# Patient Record
Sex: Female | Born: 1950 | Race: White | Hispanic: No | Marital: Married | Smoking: Former smoker
Health system: Southern US, Community
[De-identification: ages and names within clinical notes are randomized; demographics above are authoritative.]

## PROBLEM LIST (undated history)

## (undated) ENCOUNTER — Emergency Department (HOSPITAL_COMMUNITY): Payer: 59 | Source: Home / Self Care

## (undated) DIAGNOSIS — C519 Malignant neoplasm of vulva, unspecified: Secondary | ICD-10-CM

## (undated) DIAGNOSIS — Z8709 Personal history of other diseases of the respiratory system: Secondary | ICD-10-CM

## (undated) DIAGNOSIS — L309 Dermatitis, unspecified: Secondary | ICD-10-CM

## (undated) DIAGNOSIS — N9089 Other specified noninflammatory disorders of vulva and perineum: Secondary | ICD-10-CM

## (undated) DIAGNOSIS — Z923 Personal history of irradiation: Secondary | ICD-10-CM

## (undated) DIAGNOSIS — C801 Malignant (primary) neoplasm, unspecified: Secondary | ICD-10-CM

## (undated) DIAGNOSIS — R51 Headache: Secondary | ICD-10-CM

## (undated) HISTORY — DX: Other specified noninflammatory disorders of vulva and perineum: N90.89

## (undated) HISTORY — DX: Personal history of irradiation: Z92.3

---

## 1979-01-14 HISTORY — PX: DILATION AND CURETTAGE OF UTERUS: SHX78

## 1979-01-14 HISTORY — PX: TUBAL LIGATION: SHX77

## 2012-04-11 ENCOUNTER — Encounter (HOSPITAL_COMMUNITY): Payer: Self-pay | Admitting: *Deleted

## 2012-04-11 ENCOUNTER — Emergency Department (HOSPITAL_COMMUNITY): Payer: 59

## 2012-04-11 ENCOUNTER — Emergency Department (HOSPITAL_COMMUNITY)
Admission: EM | Admit: 2012-04-11 | Discharge: 2012-04-11 | Disposition: A | Discharge status: Home / Self Care | Payer: 59 | Attending: Emergency Medicine | Admitting: Emergency Medicine

## 2012-04-11 DIAGNOSIS — R200 Anesthesia of skin: Secondary | ICD-10-CM

## 2012-04-11 DIAGNOSIS — R42 Dizziness and giddiness: Secondary | ICD-10-CM

## 2012-04-11 DIAGNOSIS — R269 Unspecified abnormalities of gait and mobility: Secondary | ICD-10-CM | POA: Insufficient documentation

## 2012-04-11 DIAGNOSIS — F17200 Nicotine dependence, unspecified, uncomplicated: Secondary | ICD-10-CM | POA: Insufficient documentation

## 2012-04-11 DIAGNOSIS — H9312 Tinnitus, left ear: Secondary | ICD-10-CM

## 2012-04-11 DIAGNOSIS — R209 Unspecified disturbances of skin sensation: Secondary | ICD-10-CM | POA: Insufficient documentation

## 2012-04-11 DIAGNOSIS — R110 Nausea: Secondary | ICD-10-CM

## 2012-04-11 DIAGNOSIS — R2689 Other abnormalities of gait and mobility: Secondary | ICD-10-CM

## 2012-04-11 DIAGNOSIS — H9319 Tinnitus, unspecified ear: Secondary | ICD-10-CM | POA: Insufficient documentation

## 2012-04-11 LAB — COMPREHENSIVE METABOLIC PANEL
ALT: 13 U/L (ref 0–35)
AST: 15 U/L (ref 0–37)
Albumin: 4 g/dL (ref 3.5–5.2)
CO2: 27 mEq/L (ref 19–32)
Calcium: 9.8 mg/dL (ref 8.4–10.5)
Creatinine, Ser: 0.57 mg/dL (ref 0.50–1.10)
GFR calc non Af Amer: >90 mL/min (ref >90)
Sodium: 140 mEq/L (ref 135–145)
Total Protein: 7.3 g/dL (ref 6.0–8.3)

## 2012-04-11 LAB — CBC WITH DIFFERENTIAL/PLATELET
Basophils Absolute: 0 10*3/uL (ref 0.0–0.1)
Basophils Relative: 0 % (ref 0–1)
Eosinophils Relative: 2 % (ref 0–5)
Lymphocytes Relative: 23 % (ref 12–46)
MCHC: 33.3 g/dL (ref 30.0–36.0)
MCV: 91.4 fL (ref 78.0–100.0)
Platelets: 219 10*3/uL (ref 150–400)
RDW: 13.4 % (ref 11.5–15.5)
WBC: 6.3 10*3/uL (ref 4.0–10.5)

## 2012-04-11 LAB — TROPONIN I: Troponin I: <0.3 ng/mL (ref <0.30)

## 2012-04-11 LAB — PROTIME-INR
INR: 1.05 (ref 0.00–1.49)
Prothrombin Time: 13.6 seconds (ref 11.6–15.2)

## 2012-04-11 MED ORDER — MECLIZINE HCL 12.5 MG PO TABS
25.0000 mg | ORAL_TABLET | Freq: Once | ORAL | Status: AC
  Administered 2012-04-11: 25 mg via ORAL
  Filled 2012-04-11: qty 2

## 2012-04-11 MED ORDER — ONDANSETRON HCL 4 MG PO TABS: 4.0000 mg | ORAL_TABLET | Freq: Four times a day (QID) | ORAL | Status: DC | PRN

## 2012-04-11 MED ORDER — MECLIZINE HCL 25 MG PO TABS: 25.0000 mg | ORAL_TABLET | Freq: Four times a day (QID) | ORAL | Status: DC | PRN

## 2012-04-11 MED ORDER — ONDANSETRON 8 MG PO TBDP
8.0000 mg | ORAL_TABLET | Freq: Once | ORAL | Status: AC
  Administered 2012-04-11: 8 mg via ORAL
  Filled 2012-04-11: qty 1

## 2012-04-11 MED ORDER — MECLIZINE HCL 12.5 MG PO TABS
ORAL_TABLET | ORAL | Status: AC
  Filled 2012-04-11: qty 6

## 2012-04-11 NOTE — ED Notes (Signed)
Pt presents with light-headed dizziness, tinnitus and tingling in left 3 rd and 4 th digits. Equal hand grips. Pt denies ear pain, history of hypertension and diabetes. Pt states was engaged with increased physical work at place of employment yesterday and was thought to be the cause of dizziness and tingling in digits.  Pt refers to dizziness that began this morning, Pt reports taking a 325 mg asa this morning.  No neurological deficits noted at this time.

## 2012-04-11 NOTE — ED Notes (Signed)
Pt with ringing in ears and dizziness since this morning, an hour ago with tingling to left side of face

## 2012-04-11 NOTE — ED Provider Notes (Signed)
History   This chart was scribed for Ward Givens, MD by Gerlean Ren, ED Scribe. This patient was seen in room APA04/APA04 and the patient's care was started at 5:55 PM    CSN: [id number]  Arrival date & time 04/11/12  1657   First MD Initiated Contact with Patient 04/11/12 1704      Chief Complaint  Patient presents with  . Dizziness  . Tinnitus     The history is provided by the patient. No language interpreter was used.   Jody Beard is a 61 y.o. female with h/o migraines who presents to the Emergency Department complaining of sudden onset light-headedness when getting out of bed this morning that is still present when standing that resolves when lying supine with associated mild nausea, 1-2 hours of tingling in left third and fourth fingers that is gone, 1-2 hours of left-side face "heaviness" that resolved by itself, and 1-2 hours of associated waxing-and-waning high-pitched left sided tinnitus that resolved by itself.  Pt was ambulatory to room and has not fallen, but states that it feels like she may fall at times when walking and she reaches out to grab furniture to steady herself.  Pt denies any room spinning sensations and true dizziness, chest pain, dyspnea, HA, emesis, blurred or double vision, and numbness or weakness in lower extremities.  Pt denies h/o similar symptoms but states left-side face numbness associated with one previous migraine 20 years ago and had a normal MR brain  and did not have CVA.     Pt is not currently on any prescriptions.    Pt denies h/o cardiac problems but reports mother died at 66 y.o. after bypass.  Pt is a current everyday smoker but denies alcohol use.   No PCP.  History reviewed. No pertinent past medical history.  History reviewed. No pertinent past surgical history.  History reviewed. No pertinent family history. MOP died age 30 having CABG done in the 34's FOP died at 37's from renal cancer Sister died age 6 from kidney  disease  History  Substance Use Topics  . Smoking status: Current Every Day Smoker -- 0.5 packs/day    Types: Cigarettes  . Smokeless tobacco: Not on file  . Alcohol Use: No  employed Lives at home Lives with spouse No OB history provided.  Moved from [place] 6 years ago  Review of Systems  HENT: Positive for tinnitus.   Eyes: Negative for visual disturbance.  Respiratory: Negative for shortness of breath.   Cardiovascular: Negative for chest pain.  Gastrointestinal: Positive for nausea. Negative for vomiting.  Neurological: Positive for light-headedness and numbness. Negative for dizziness, weakness and headaches.  All other systems reviewed and are negative.    Allergies  Penicillins  Home Medications  No current outpatient prescriptions on file.  BP 151/82  Pulse 88  Temp 97.4 F (36.3 C) (Oral)  Resp 18  Ht 5\' 1"  (1.549 m)  Wt 145 lb (65.772 kg)  BMI 27.40 kg/m2  SpO2 98%  Vital signs normal except mild hypertension   Physical Exam  Nursing note and vitals reviewed. Constitutional: She is oriented to person, place, and time. She appears well-developed and well-nourished.  Non-toxic appearance. She does not appear ill. No distress.       Feels off balance when I stand her up however no nystagymus seen  HENT:  Head: Normocephalic and atraumatic.  Right Ear: External ear normal.  Left Ear: External ear normal.  Nose: Nose normal. No mucosal edema  or rhinorrhea.  Mouth/Throat: Oropharynx is clear and moist and mucous membranes are normal. No dental abscesses or uvula swelling.       Tongue dry.  Eyes: Conjunctivae normal and EOM are normal. Pupils are equal, round, and reactive to light.       No nystagmus.  Neck: Normal range of motion and full passive range of motion without pain. Neck supple.  Cardiovascular: Normal rate, regular rhythm and normal heart sounds.  Exam reveals no gallop and no friction rub.   No murmur heard. Pulmonary/Chest: Effort normal  and breath sounds normal. No respiratory distress. She has no wheezes. She has no rhonchi. She has no rales. She exhibits no tenderness and no crepitus.  Abdominal: Soft. Normal appearance and bowel sounds are normal. She exhibits no distension. There is no tenderness. There is no rebound and no guarding.  Musculoskeletal: Normal range of motion. She exhibits no edema and no tenderness.       Moves all extremities well.   Neurological: She is alert and oriented to person, place, and time. She has normal strength. No cranial nerve deficit. Coordination normal.       No pronator drift.  5/5 equal grip strength.  No motor weakness.  Skin: Skin is warm, dry and intact. No rash noted. No erythema. No pallor.  Psychiatric: She has a normal mood and affect. Her speech is normal and behavior is normal. Her mood appears not anxious.    ED Course  Procedures (including critical care time) DIAGNOSTIC STUDIES: Oxygen Saturation is 98% on room air, normal by my interpretation.    COORDINATION OF CARE: 6:06 PM- Patient informed of clinical course, understands medical decision-making process, and agrees with plan.  Ordered PO antivert, PO Zofran, CBC, c-met, APTT, protime-INR, troponin, chest XR, and head CT w/o contrast.  Recheck 19:15 pt feels better on standing up after medications, still has mild feeling of being off balance. Her facial heaviness and numbness in her fingers are gone. No tinnitus at this time. We discussed coming back to get a MR (no available today b/o holiday) and to start taking a regular aspirin a day. She also was shown her CXR and states she is going to quit smoking.   Results for orders placed during the hospital encounter of 04/11/12  CBC WITH DIFFERENTIAL      Component Value Range   WBC 6.3  4.0 - 10.5 K/uL   RBC 4.79  3.87 - 5.11 MIL/uL   Hemoglobin 14.6  12.0 - 15.0 g/dL   HCT 16.1  09.6 - 04.5 %   MCV 91.4  78.0 - 100.0 fL   MCH 30.5  26.0 - 34.0 pg   MCHC 33.3  30.0 -  36.0 g/dL   RDW 40.9  81.1 - 91.4 %   Platelets 219  150 - 400 K/uL   Neutrophils Relative 68  43 - 77 %   Neutro Abs 4.2  1.7 - 7.7 K/uL   Lymphocytes Relative 23  12 - 46 %   Lymphs Abs 1.5  0.7 - 4.0 K/uL   Monocytes Relative 7  3 - 12 %   Monocytes Absolute 0.5  0.1 - 1.0 K/uL   Eosinophils Relative 2  0 - 5 %   Eosinophils Absolute 0.1  0.0 - 0.7 K/uL   Basophils Relative 0  0 - 1 %   Basophils Absolute 0.0  0.0 - 0.1 K/uL  COMPREHENSIVE METABOLIC PANEL      Component Value Range  Sodium 140  135 - 145 mEq/L   Potassium 4.0  3.5 - 5.1 mEq/L   Chloride 104  96 - 112 mEq/L   CO2 27  19 - 32 mEq/L   Glucose, Bld 98  70 - 99 mg/dL   BUN 9  6 - 23 mg/dL   Creatinine, Ser 1.61  0.50 - 1.10 mg/dL   Calcium 9.8  8.4 - 09.6 mg/dL   Total Protein 7.3  6.0 - 8.3 g/dL   Albumin 4.0  3.5 - 5.2 g/dL   AST 15  0 - 37 U/L   ALT 13  0 - 35 U/L   Alkaline Phosphatase 94  39 - 117 U/L   Total Bilirubin 0.3  0.3 - 1.2 mg/dL   GFR calc non Af Amer >90  >90 mL/min   GFR calc Af Amer >90  >90 mL/min  APTT      Component Value Range   aPTT 42 (*) 24 - 37 seconds  PROTIME-INR      Component Value Range   Prothrombin Time 13.6  11.6 - 15.2 seconds   INR 1.05  0.00 - 1.49  TROPONIN I      Component Value Range   Troponin I <0.30  <0.30 ng/mL   Laboratory interpretation all normal  Dg Chest 2 View  04/11/2012  *RADIOLOGY REPORT*  Clinical Data: Dizziness  CHEST - 2 VIEW  Comparison: None.  Findings: Lungs are hyperaerated suggestive of emphysema.  Heart size is normal.  No focal pulmonary opacity.  No pleural effusion. No acute osseous finding.  IMPRESSION: Hyperinflation suggestive of emphysematous changes.  No acute finding.   Original Report Authenticated By: Christiana Pellant, M.D.    Ct Head Wo Contrast  04/11/2012  *RADIOLOGY REPORT*  Clinical Data: Dizziness  CT HEAD WITHOUT CONTRAST  Technique:  Contiguous axial images were obtained from the base of the skull through the vertex  without contrast.  Comparison: None.  Findings: Prominent perivascular space incidentally noted adjacent to the left external capsule image 9. No acute hemorrhage, acute infarction, or mass lesion is identified.  No midline shift.  No ventriculomegaly.  No skull fracture.  No midline shift.  Orbits and paranasal sinuses are unremarkable.  IMPRESSION: Normal head CT.   Original Report Authenticated By: Christiana Pellant, M.D.      Date: 04/11/2012  Rate: 66  Rhythm: normal sinus rhythm  QRS Axis: normal  Intervals: normal  ST/T Wave abnormalities: normal  Conduction Disutrbances:none  Narrative Interpretation:   Old EKG Reviewed: none available      1. Dizziness   2. Impairment of balance   3. Facial numbness   4. Numbness of fingers   5. Nausea   6. Tinnitus, left       MDM  patient has symptoms that are worrisome for possible TIA today, her symptoms were slightly atypical for vertigo in that she had no spinning sensation. Is unavailable today at this facility and her symptoms have improved. Therefore am going to schedule her for outpatient MR the brain tomorrow      I personally performed the services described in this documentation, which was scribed in my presence. The recorded information has been reviewed and considered.  Devoria Albe, MD, [facility]         Ward Givens, MD 04/11/12 2007

## 2012-04-12 ENCOUNTER — Ambulatory Visit (HOSPITAL_COMMUNITY)
Admit: 2012-04-12 | Discharge: 2012-04-12 | Disposition: A | Discharge status: Home / Self Care | Payer: 59 | Attending: Emergency Medicine | Admitting: Emergency Medicine

## 2012-04-12 DIAGNOSIS — H9319 Tinnitus, unspecified ear: Secondary | ICD-10-CM | POA: Insufficient documentation

## 2014-01-13 DIAGNOSIS — C801 Malignant (primary) neoplasm, unspecified: Secondary | ICD-10-CM

## 2014-01-13 DIAGNOSIS — C519 Malignant neoplasm of vulva, unspecified: Secondary | ICD-10-CM

## 2014-01-13 HISTORY — DX: Malignant neoplasm of vulva, unspecified: C51.9

## 2014-01-13 HISTORY — DX: Malignant (primary) neoplasm, unspecified: C80.1

## 2014-02-02 ENCOUNTER — Encounter (HOSPITAL_COMMUNITY): Payer: Self-pay | Admitting: Emergency Medicine

## 2014-02-02 ENCOUNTER — Emergency Department (HOSPITAL_COMMUNITY)
Admission: EM | Admit: 2014-02-02 | Discharge: 2014-02-02 | Disposition: A | Discharge status: Home / Self Care | Payer: BC Managed Care – PPO | Attending: Emergency Medicine | Admitting: Emergency Medicine

## 2014-02-02 DIAGNOSIS — Z880 Allergy status to penicillin: Secondary | ICD-10-CM | POA: Diagnosis not present

## 2014-02-02 DIAGNOSIS — N9089 Other specified noninflammatory disorders of vulva and perineum: Secondary | ICD-10-CM

## 2014-02-02 DIAGNOSIS — R599 Enlarged lymph nodes, unspecified: Secondary | ICD-10-CM | POA: Diagnosis not present

## 2014-02-02 DIAGNOSIS — Z87891 Personal history of nicotine dependence: Secondary | ICD-10-CM | POA: Diagnosis not present

## 2014-02-02 DIAGNOSIS — Z792 Long term (current) use of antibiotics: Secondary | ICD-10-CM | POA: Diagnosis not present

## 2014-02-02 DIAGNOSIS — N9489 Other specified conditions associated with female genital organs and menstrual cycle: Secondary | ICD-10-CM | POA: Insufficient documentation

## 2014-02-02 DIAGNOSIS — R590 Localized enlarged lymph nodes: Secondary | ICD-10-CM

## 2014-02-02 MED ORDER — CLINDAMYCIN HCL 300 MG PO CAPS
300.0000 mg | ORAL_CAPSULE | Freq: Four times a day (QID) | ORAL | Status: DC
Start: 2014-02-02 — End: 2014-02-16

## 2014-02-02 NOTE — Discharge Instructions (Signed)
AS WE DISCUSSED THESE LESIONS COULD BE CANCER.  PLEASE FOLLOWUP WITH GYNECOLOGY THIS WEEK  PLEASE RETURN FOR ANY NEW PAIN, ANY BLEEDING OR ANY FEVER OVER 100.2F OVER THE NEXT 24-48 HOURS

## 2014-02-02 NOTE — ED Notes (Signed)
Pt c/o multiple growths to groin and vaginal area x few months

## 2014-02-02 NOTE — ED Provider Notes (Signed)
CSN: [id number]     Arrival date & time 02/02/14  6384 History   First MD Initiated Contact with Patient 02/02/14 0606     Chief Complaint  Patient presents with  . Mass      The history is provided by the patient.  Patient presents for "masses" in her right groin as well as her vulvar region for "months"  She reports they are increasing in size.  She denies any significant pain.  No drainage from the lesions but did have small amount of bleeding today No abd pain No fever/vomiting No vaginal bleeding is reported.   She has never been evaluated for these lesions previously.  PMH - none History  Substance Use Topics  . Smoking status: Former Smoker -- 0.50 packs/day    Types: Cigarettes  . Smokeless tobacco: Not on file  . Alcohol Use: No   OB History   Grav Para Term Preterm Abortions TAB SAB Ect Mult Living                 Review of Systems  Constitutional: Negative for fever.  Gastrointestinal: Negative for vomiting and abdominal pain.  Genitourinary: Negative for vaginal bleeding.  Skin:       Skins lesions   All other systems reviewed and are negative.     Allergies  Penicillins  Home Medications   Prior to Admission medications   Medication Sig Start Date End Date Taking? Authorizing Provider  aspirin 325 MG tablet Take 650 mg by mouth once as needed. For pain    Historical Provider, MD  clindamycin (CLEOCIN) 300 MG capsule Take 1 capsule (300 mg total) by mouth 4 (four) times daily. X 7 days 02/02/14   Sharyon Cable, MD   BP 157/71  Pulse 76  Temp(Src) 97.9 F (36.6 C) (Oral)  Resp 20  Ht 5\' 1"  (1.549 m)  Wt 128 lb (58.06 kg)  BMI 24.20 kg/m2  SpO2 98% Physical Exam CONSTITUTIONAL: Well developed/well nourished HEAD: Normocephalic/atraumatic EYES: EOMI ENMT: Mucous membranes moist NECK: supple no meningeal signs CV: S1/S2 noted LUNGS: Lungs are clear to auscultation bilaterally, no apparent distress ABDOMEN: soft, nontender, no rebound or  guarding YK:[id number] chaperone present for exam - pt with large masses noted to right vulvar area. They are irregular in shape.  There is no active bleeding but there is localized erythema to lesions. NEURO: Pt is awake/alert, moves all extremitiesx4 EXTREMITIES: pulses normal, full ROM SKIN: warm, color normal LYMPH: - right inguinal lymphadenopathy noted   ED Course  Procedures   I am suspicious for vulvar cancer.  Pt reports these lesions have been present for months and are worsening but no medical evaluation.  She understands this could be cancer and she understands need for close f/u She has been instructed to f/u with Gynecology this week.  She will likely need biopsy very soon for diagnosis.  She was instructed to return to the ER if there is worsening pain/erythema or fever.  Will start on clindamycin in case there is any secondary infection associated with these lesions.  MDM   Final diagnoses:  Vulvar mass  Inguinal lymphadenopathy    Nursing notes including past medical history and social history reviewed and considered in documentation     Sharyon Cable, MD 02/02/14 [phone]

## 2014-02-03 ENCOUNTER — Encounter: Payer: Self-pay | Admitting: Obstetrics & Gynecology

## 2014-02-03 ENCOUNTER — Ambulatory Visit (INDEPENDENT_AMBULATORY_CARE_PROVIDER_SITE_OTHER): Payer: BC Managed Care – PPO | Admitting: Obstetrics & Gynecology

## 2014-02-03 VITALS — BP 150/80 | Ht 61.0 in | Wt 127.0 lb

## 2014-02-03 DIAGNOSIS — D4959 Neoplasm of unspecified behavior of other genitourinary organ: Secondary | ICD-10-CM

## 2014-02-03 DIAGNOSIS — C519 Malignant neoplasm of vulva, unspecified: Secondary | ICD-10-CM | POA: Insufficient documentation

## 2014-02-03 NOTE — Progress Notes (Signed)
Patient ID: Jody Beard, female   DOB: 07-25-1950, 63 y.o.   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 No pertinent past medical history.  History reviewed. No pertinent past surgical history.  OB History   Grav Para Term Preterm Abortions TAB SAB Ect Mult Living                  Allergies  Allergen Reactions  . Penicillins Rash    History   Social History  . Marital Status: Married    Spouse Name: N/A    Number of Children: N/A  . Years of Education: N/A   Social History Main Topics  . Smoking status: Former Smoker -- 0.50 packs/day    Types: Cigarettes  . Smokeless tobacco: None  . Alcohol Use: No  . Drug Use: No  . Sexual Activity: None   Other Topics Concern  . None   Social History Narrative  . None    History reviewed. No pertinent family history.   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

## 2014-02-03 NOTE — Patient Instructions (Signed)
GYN ONCOLOGY   [id number]

## 2014-02-04 ENCOUNTER — Telehealth: Payer: Self-pay | Admitting: Obstetrics & Gynecology

## 2014-02-04 NOTE — Telephone Encounter (Signed)
perfect

## 2014-02-05 ENCOUNTER — Encounter (HOSPITAL_COMMUNITY): Payer: Self-pay | Admitting: Pharmacy Technician

## 2014-02-05 ENCOUNTER — Encounter: Payer: Self-pay | Admitting: Gynecologic Oncology

## 2014-02-05 ENCOUNTER — Ambulatory Visit: Payer: BC Managed Care – PPO | Attending: Gynecologic Oncology | Admitting: Gynecologic Oncology

## 2014-02-05 VITALS — BP 161/69 | HR 69 | Temp 98.0°F | Resp 20 | Ht 61.0 in | Wt 128.7 lb

## 2014-02-05 DIAGNOSIS — Z87891 Personal history of nicotine dependence: Secondary | ICD-10-CM | POA: Insufficient documentation

## 2014-02-05 DIAGNOSIS — N9089 Other specified noninflammatory disorders of vulva and perineum: Secondary | ICD-10-CM | POA: Diagnosis present

## 2014-02-05 DIAGNOSIS — D071 Carcinoma in situ of vulva: Secondary | ICD-10-CM | POA: Insufficient documentation

## 2014-02-05 DIAGNOSIS — R599 Enlarged lymph nodes, unspecified: Secondary | ICD-10-CM | POA: Diagnosis not present

## 2014-02-05 DIAGNOSIS — C519 Malignant neoplasm of vulva, unspecified: Secondary | ICD-10-CM | POA: Insufficient documentation

## 2014-02-05 DIAGNOSIS — C44721 Squamous cell carcinoma of skin of unspecified lower limb, including hip: Secondary | ICD-10-CM | POA: Insufficient documentation

## 2014-02-05 HISTORY — PX: VULVA /PERINEUM BIOPSY: SHX319

## 2014-02-05 NOTE — Progress Notes (Signed)
Consult Note: Gyn-Onc  Consult was requested by Dr. Elonda Husky for the evaluation of Jody Beard 63 y.o. female  CC:  Chief Complaint  Patient presents with  . Vulvar Lesion    New patient    Assessment/Plan:  [name]  is a 63 y.o.  With a 7cm vulvar lesion that extends additionally into the 4cmof the distal vagina,  the vaginal submucosa and the urethra.  Satellite lesions appreciated on the inner thigh and mons.  Bilateral inguinal adenopathy noted.  The vulvar and satellite lesions were biopsied today.  Will proceed with EUA  Bilateral inguinal LND 02/10/2014 with Dr. Denman George PET to assess the extent of the metastatic disease Referral to Drs Marko Plume and Sondra Come for radiotherapy with CDDP sensitization Consider HIV test given the radid progression of the disease.   HPI: [name]  is a 63 y.o.   G2P2 wo first identified a right vulvar mass 7 months ago.  The lesion has increased in size.  Noted new lesions on the thigh and mons within the past few weeks. .  Reports vulvar itching for 7 months.  Reports h/o 1ppd tobacco use for 30 years.  Quit 07/2013.  Review of Systems:  Constitutional  Feels well,  Cardiovascular  No chest pain, shortness of breath, or edema  Pulmonary  No cough or wheeze.  Gastro Intestinal  No nausea, vomitting, or diarrhoea. No bright red blood per rectum, no abdominal pain, change in bowel movement, or constipation.  Genito Urinary  No frequency, urgency, dysuria,.  Reports discomfort sitting, intermittent vaginal bleeding Musculo Skeletal  No myalgia, arthralgia, joint swelling or pain  Neurologic  No weakness, numbness, change in gait,  Psychology  No depression.  Patient reports anxiety about the diagnosis  Current Meds:  Outpatient Encounter Prescriptions as of 02/05/2014  Medication Sig  . aspirin 325 MG tablet Take 650 mg by mouth once as needed. For pain  . clindamycin (CLEOCIN) 300 MG capsule Take 1 capsule (300 mg total) by mouth 4  (four) times daily. X 7 days    Allergy:  Allergies  Allergen Reactions  . Penicillins Rash    Social Hx:   History   Social History  . Marital Status: Married    Spouse Name: N/A    Number of Children: N/A  . Years of Education: N/A   Occupational History  . Not on file.   Social History Main Topics  . Smoking status: Former Smoker -- 0.50 packs/day    Types: Cigarettes    Quit date: 07/13/2013  . Smokeless tobacco: Not on file  . Alcohol Use: No  . Drug Use: No  . Sexual Activity: Not on file   Other Topics Concern  . Not on file   Social History Narrative  . No narrative on file    Past Surgical Hx: History reviewed. No pertinent past surgical history.  Past Medical Hx:  Past Medical History  Diagnosis Date  . Vulvar lesion     Past Gynecological History:  G2P2 last pap > 9 years ago. No LMP recorded. Patient is postmenopausal.  Family Hx:  Family History  Problem Relation Age of Onset  . Heart disease Mother   . Kidney cancer Father   . Prostate cancer Cousin     Vitals:  Blood pressure 161/69, pulse 69, temperature 98 F (36.7 C), temperature source Oral, resp. rate 20, height 5\' 1"  (1.549 m), weight 128 lb 11.2 oz (58.378 kg).  Physical Exam: WD in NAD  Neck  Supple NROM, without any enlargements.  Lymph Node Survey No cervical or supraclavicular  Adenopathy.  3cm right inguinal LN.  2 cm left inguinal LN Cardiovascular  Pulse normal rate, regularity and rhythm. S1 and S2 normal.  Lungs  Clear to auscultation bilaterally, Good air movement.  Skin  No rash/lesions/breakdown  Psychiatry  Alert and oriented appropriate mood affect speech and reasoning. Abdomen  Normoactive bowel sounds, abdomen soft, non-tender. Surgical  sites intact without evidence of hernia.  Back No CVA tenderness Genito Urinary  Vulva/vagina: 7 cm bilobed right vulavr mass with an additional 4cm extension to the distal vagina..  ? Satellite lesions of the inner  thigh and right mons. Bladder/urethra:  No lesions or masses  Vagina: disease palpable in the distal 4cm of the vagina, tumor palpable in the vaginal submucosa/ischiorectal area.- extension of the vulvar tumor.  Disease extends anterioraly to the ureteral meatus.  Cervix: unable to palpate, unable to insert speculum because of pelvic discomfort.    Rectal  Good tone, no masses no cul de sac nodularity.  Extremities  No bilateral cyanosis, clubbing or edema.  VULVAR BIOPSIES The procedure was reviewed with the patient including risks and potential complications. The introital and inner thigh areas  were prepped with betadine. 3 cc of 1% lidocaine was used to anesthetize the areas. Biopsies were collected at the vaginal introius and right inner thigh. The bleeding was controlled with monsels. The patient tolerated the procedure with minimal discomfort. Janie Morning, MD, PhD 02/05/2014, 9:36 AM

## 2014-02-06 ENCOUNTER — Ambulatory Visit (HOSPITAL_COMMUNITY)
Admission: RE | Admit: 2014-02-06 | Discharge: 2014-02-06 | Disposition: A | Discharge status: Home / Self Care | Payer: BC Managed Care – PPO | Source: Ambulatory Visit | Attending: Gynecologic Oncology | Admitting: Gynecologic Oncology

## 2014-02-06 ENCOUNTER — Encounter (HOSPITAL_COMMUNITY): Payer: Self-pay

## 2014-02-06 ENCOUNTER — Encounter (HOSPITAL_COMMUNITY)
Admission: RE | Admit: 2014-02-06 | Discharge: 2014-02-06 | Disposition: A | Discharge status: Home / Self Care | Payer: BC Managed Care – PPO | Source: Ambulatory Visit | Attending: Gynecologic Oncology | Admitting: Gynecologic Oncology

## 2014-02-06 ENCOUNTER — Other Ambulatory Visit: Payer: Self-pay | Admitting: Gynecologic Oncology

## 2014-02-06 DIAGNOSIS — C519 Malignant neoplasm of vulva, unspecified: Secondary | ICD-10-CM

## 2014-02-06 DIAGNOSIS — R918 Other nonspecific abnormal finding of lung field: Secondary | ICD-10-CM

## 2014-02-06 HISTORY — DX: Personal history of other diseases of the respiratory system: Z87.09

## 2014-02-06 HISTORY — DX: Malignant neoplasm of vulva, unspecified: C51.9

## 2014-02-06 HISTORY — DX: Dermatitis, unspecified: L30.9

## 2014-02-06 HISTORY — DX: Headache: R51

## 2014-02-06 LAB — CBC WITH DIFFERENTIAL/PLATELET
BASOS PCT: 0 % (ref 0–1)
Basophils Absolute: 0 10*3/uL (ref 0.0–0.1)
Eosinophils Absolute: 0.2 10*3/uL (ref 0.0–0.7)
Eosinophils Relative: 3 % (ref 0–5)
HEMATOCRIT: 42.1 % (ref 36.0–46.0)
HEMOGLOBIN: 13.4 g/dL (ref 12.0–15.0)
LYMPHS PCT: 17 % (ref 12–46)
Lymphs Abs: 1.6 10*3/uL (ref 0.7–4.0)
MCH: 28.5 pg (ref 26.0–34.0)
MCHC: 31.8 g/dL (ref 30.0–36.0)
MCV: 89.6 fL (ref 78.0–100.0)
MONO ABS: 0.7 10*3/uL (ref 0.1–1.0)
Monocytes Relative: 8 % (ref 3–12)
NEUTROS ABS: 6.7 10*3/uL (ref 1.7–7.7)
NEUTROS PCT: 72 % (ref 43–77)
Platelets: 304 10*3/uL (ref 150–400)
RBC: 4.7 MIL/uL (ref 3.87–5.11)
RDW: 13.6 % (ref 11.5–15.5)
WBC: 9.2 10*3/uL (ref 4.0–10.5)

## 2014-02-06 LAB — COMPREHENSIVE METABOLIC PANEL
ALBUMIN: 4.3 g/dL (ref 3.5–5.2)
ALK PHOS: 97 U/L (ref 39–117)
ALT: 11 U/L (ref 0–35)
AST: 15 U/L (ref 0–37)
Anion gap: 13 (ref 5–15)
BILIRUBIN TOTAL: 0.3 mg/dL (ref 0.3–1.2)
BUN: 8 mg/dL (ref 6–23)
CHLORIDE: 101 meq/L (ref 96–112)
CO2: 26 meq/L (ref 19–32)
Calcium: 10.3 mg/dL (ref 8.4–10.5)
Creatinine, Ser: 0.72 mg/dL (ref 0.50–1.10)
GFR calc Af Amer: >90 mL/min (ref >90)
Glucose, Bld: 105 mg/dL — ABNORMAL HIGH (ref 70–99)
POTASSIUM: 4.9 meq/L (ref 3.7–5.3)
Sodium: 140 mEq/L (ref 137–147)
Total Protein: 8.2 g/dL (ref 6.0–8.3)

## 2014-02-06 NOTE — Progress Notes (Signed)
No antibiotic ordered for surgery.  Do you plan to order antibiotic for surgery?  Thank You.

## 2014-02-06 NOTE — Patient Instructions (Signed)
[place]  02/06/2014   Your procedure is scheduled on: Tuesday 02/10/14  Report to [place] at 12:10 PM.  Call this number if you have problems the morning of surgery [phone]   Remember:   Do not eat food After Midnight, clear liquids from midnight until 08:10am on 02/10/14 the nothing.    Do not wear jewelry, make-up or nail polish.  Do not wear lotions, powders, or perfumes. You may wear deodorant.  Do not shave 48 hours prior to surgery. Men may shave face and neck.  Do not bring valuables to the hospital.  Contacts, dentures or bridgework may not be worn into surgery.  Leave suitcase in the car. After surgery it may be brought to your room.  For patients admitted to the hospital, checkout time is 11:00 AM the day of discharge.  Paulette Blanch, RN  pre op nurse call if needed [phone]    [facility] - Preparing for Surgery Before surgery, you can play an important role.  Because skin is not sterile, your skin needs to be as free of germs as possible.  You can reduce the number of germs on your skin by washing with CHG (chlorahexidine gluconate) soap before surgery.  CHG is an antiseptic cleaner which kills germs and bonds with the skin to continue killing germs even after washing. Please DO NOT use if you have an allergy to CHG or antibacterial soaps.  If your skin becomes reddened/irritated stop using the CHG and inform your nurse when you arrive at Short Stay. Do not shave (including legs and underarms) for at least 48 hours prior to the first CHG shower.  You may shave your face/neck. Please follow these instructions carefully:  1.  Shower with CHG Soap the night before surgery and the  morning of Surgery.  2.  If you choose to wash your hair, wash your hair first as usual with your  normal  shampoo.  3.  After you shampoo, rinse your hair and body thoroughly to remove the  shampoo.                            4.  Use CHG as you would any other  liquid soap.  You can apply chg directly  to the skin and wash                       Gently with a scrungie or clean washcloth.  5.  Apply the CHG Soap to your body ONLY FROM THE NECK DOWN.   Do not use on face/ open                           Wound or open sores. Avoid contact with eyes, ears mouth and genitals (private parts).                       Wash face,  Genitals (private parts) with your normal soap.             6.  Wash thoroughly, paying special attention to the area where your surgery  will be performed.  7.  Thoroughly rinse your body with warm water from the neck down.  8.  DO NOT shower/wash with your normal soap after using and rinsing off  the CHG Soap.  9.  Pat yourself dry with a clean towel.            10.  Wear clean pajamas.            11.  Place clean sheets on your bed the night of your first shower and do not  sleep with pets. Day of Surgery : Do not apply any lotions the morning of surgery.  Please wear clean clothes to the hospital/surgery center.  FAILURE TO FOLLOW THESE INSTRUCTIONS MAY RESULT IN THE CANCELLATION OF YOUR SURGERY PATIENT SIGNATURE_________________________________  NURSE SIGNATURE__________________________________  ________________________________________________________________________    CLEAR LIQUID DIET   Foods Allowed                                                                     Foods Excluded  Coffee and tea, regular and decaf                             liquids that you cannot  Plain Jell-O in any flavor                                             see through such as: Fruit ices (not with fruit pulp)                                     milk, soups, orange juice  Iced Popsicles                                    All solid food Carbonated beverages, regular and diet                                    Cranberry, grape and apple juices Sports drinks like Gatorade Lightly seasoned clear broth or consume(fat  free) Sugar, honey syrup  Sample Menu Breakfast                                Lunch                                     Supper Cranberry juice                    Beef broth                            Chicken broth Jell-O                                     Grape juice  Apple juice Coffee or tea                        Jell-O                                      Popsicle                                                Coffee or tea                        Coffee or tea  _____________________________________________________________________    Incentive Spirometer  An incentive spirometer is a tool that can help keep your lungs clear and active. This tool measures how well you are filling your lungs with each breath. Taking long deep breaths may help reverse or decrease the chance of developing breathing (pulmonary) problems (especially infection) following:  A long period of time when you are unable to move or be active. BEFORE THE PROCEDURE   If the spirometer includes an indicator to show your best effort, your nurse or respiratory therapist will set it to a desired goal.  If possible, sit up straight or lean slightly forward. Try not to slouch.  Hold the incentive spirometer in an upright position. INSTRUCTIONS FOR USE  1. Sit on the edge of your bed if possible, or sit up as far as you can in bed or on a chair. 2. Hold the incentive spirometer in an upright position. 3. Breathe out normally. 4. Place the mouthpiece in your mouth and seal your lips tightly around it. 5. Breathe in slowly and as deeply as possible, raising the piston or the ball toward the top of the column. 6. Hold your breath for 3-5 seconds or for as long as possible. Allow the piston or ball to fall to the bottom of the column. 7. Remove the mouthpiece from your mouth and breathe out normally. 8. Rest for a few seconds and repeat Steps 1 through 7 at least 10 times every 1-2 hours when you  are awake. Take your time and take a few normal breaths between deep breaths. 9. The spirometer may include an indicator to show your best effort. Use the indicator as a goal to work toward during each repetition. 10. After each set of 10 deep breaths, practice coughing to be sure your lungs are clear. If you have an incision (the cut made at the time of surgery), support your incision when coughing by placing a pillow or rolled up towels firmly against it. Once you are able to get out of bed, walk around indoors and cough well. You may stop using the incentive spirometer when instructed by your caregiver.  RISKS AND COMPLICATIONS  Take your time so you do not get dizzy or light-headed.  If you are in pain, you may need to take or ask for pain medication before doing incentive spirometry. It is harder to take a deep breath if you are having pain. AFTER USE  Rest and breathe slowly and easily.  It can be helpful to keep track of a log of your progress. Your caregiver can provide you with a simple table to help with this. If you are using the  spirometer at home, follow these instructions: [place] IF:   You are having difficultly using the spirometer.  You have trouble using the spirometer as often as instructed.  Your pain medication is not giving enough relief while using the spirometer.  You develop fever of 100.5 F (38.1 C) or higher. SEEK IMMEDIATE MEDICAL CARE IF:   You cough up bloody sputum that had not been present before.  You develop fever of 102 F (38.9 C) or greater.  You develop worsening pain at or near the incision site. MAKE SURE YOU:   Understand these instructions.  Will watch your condition.  Will get help right away if you are not doing well or get worse. Document Released: 09/11/2006 Document Revised: 07/24/2011 Document Reviewed: 11/12/2006 [facility] Patient Information 2014 [place],  [place].   ________________________________________________________________________

## 2014-02-06 NOTE — Telephone Encounter (Signed)
Informed patient of concernign finding of a 4cm lesion in the left lower lobe on chest xray. It is highly concerning for neoplasm on CXR, and this is concerning given her smoking history. Given the size of this lesion and its peripheral location (making a met from the vulva less likely), I feel that the best course of action would be to postpone her groin surgery for now, and further workup the chest lesion (with CT scan then determination if bronchoscopic vs transthoracic bx is most appropriate). Once we establish the nature of this chest lesion (second primary vs met from vulva cancer) we can better determine the course of therapy regarding proceeding with groin dissection and vulvar radiation, or moving towards treating the lung lesion and vulvar lesions simultaneously vs prioritizing the lung lesion.  We will contact [place] with times for her CT chest and biopsy, and keep her informed of results and plans as they develop.  Donaciano Eva, MD

## 2014-02-09 ENCOUNTER — Ambulatory Visit (HOSPITAL_COMMUNITY)
Admission: RE | Admit: 2014-02-09 | Discharge: 2014-02-09 | Disposition: A | Discharge status: Home / Self Care | Payer: BC Managed Care – PPO | Source: Ambulatory Visit | Attending: Gynecologic Oncology | Admitting: Gynecologic Oncology

## 2014-02-09 ENCOUNTER — Encounter (HOSPITAL_COMMUNITY): Payer: Self-pay

## 2014-02-09 DIAGNOSIS — R222 Localized swelling, mass and lump, trunk: Secondary | ICD-10-CM | POA: Diagnosis present

## 2014-02-09 DIAGNOSIS — R918 Other nonspecific abnormal finding of lung field: Secondary | ICD-10-CM

## 2014-02-09 DIAGNOSIS — J438 Other emphysema: Secondary | ICD-10-CM | POA: Diagnosis not present

## 2014-02-09 DIAGNOSIS — C519 Malignant neoplasm of vulva, unspecified: Secondary | ICD-10-CM | POA: Diagnosis present

## 2014-02-09 DIAGNOSIS — I700 Atherosclerosis of aorta: Secondary | ICD-10-CM | POA: Insufficient documentation

## 2014-02-09 MED ORDER — IOHEXOL 300 MG/ML  SOLN
80.0000 mL | Freq: Once | INTRAMUSCULAR | Status: AC | PRN
  Administered 2014-02-09: 80 mL via INTRAVENOUS

## 2014-02-10 ENCOUNTER — Ambulatory Visit (HOSPITAL_COMMUNITY)
Admission: RE | Admit: 2014-02-10 | Payer: BC Managed Care – PPO | Source: Ambulatory Visit | Admitting: Gynecologic Oncology

## 2014-02-10 ENCOUNTER — Encounter (HOSPITAL_COMMUNITY): Admission: RE | Payer: Self-pay | Source: Ambulatory Visit

## 2014-02-10 SURGERY — LYMPH NODE DISSECTION
Anesthesia: General | Laterality: Bilateral

## 2014-02-12 ENCOUNTER — Other Ambulatory Visit: Payer: Self-pay | Admitting: Gynecologic Oncology

## 2014-02-12 ENCOUNTER — Ambulatory Visit (HOSPITAL_COMMUNITY): Payer: BC Managed Care – PPO

## 2014-02-12 ENCOUNTER — Telehealth: Payer: Self-pay | Admitting: Gynecologic Oncology

## 2014-02-12 DIAGNOSIS — R918 Other nonspecific abnormal finding of lung field: Secondary | ICD-10-CM

## 2014-02-12 DIAGNOSIS — C519 Malignant neoplasm of vulva, unspecified: Secondary | ICD-10-CM

## 2014-02-12 NOTE — Telephone Encounter (Signed)
Informed patient of chest CT results which showed a left lower lobe lesion suspicious for primary lung cancer. Discussed case with IR who felt lesion would best be biopsied trans bronchially. The patient does not have a pulmonologist. We will facilitate referral to a pulmonologist for transbronchial biopsy of left lung mass to histologically confirm malignancy and better guide therapy.  Jody Beard

## 2014-02-12 NOTE — Progress Notes (Signed)
Patient informed of upcoming appt with [facility] on Oct 14 at 1:30 pm.  Advised to call for any questions or concerns.

## 2014-02-13 ENCOUNTER — Telehealth: Payer: Self-pay | Admitting: *Deleted

## 2014-02-13 ENCOUNTER — Encounter: Payer: Self-pay | Admitting: Radiation Oncology

## 2014-02-13 NOTE — Progress Notes (Signed)
GYN Location of Tumor / Histology: vulvar lesion  Jody Beard presented <1 month ago with symptoms of: patient identified vulvar mass 7 mos ago, lesion has increased in size, noticed new lesions on right thigh, mons within past few weeks, vulvar itching, intermittent vaginal bleeding, discomfort sitting  Biopsies of vulva/skin (if applicable) revealed:  9/62/83 Diagnosis 1. 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 - ONE LATERAL EDGE IS INVOLVED BY HIGH GRADE SQUAMOUS INTRAEPITHELIAL LESION (VIN-III/VIN USUAL TYPE). - ONE EDGE IS NEGATIVE FOR DYSPLASIA OR MALIGNANCY. 2. Skin Biopsy-(P), inner thigh - INVASIVE MODERATELY DIFFERENTIATED SQUAMOUS CELL CARCINOMA. PLEASE SEE COMMENT.  Past/Anticipated interventions by Gyn/Onc surgery, if any: biopsy  Past/Anticipated interventions by medical oncology, if any: referral made to Dr Marko Plume by Dr Guy Sandifer, no appt scheduled at this time  Weight changes, if any: no  Bowel/Bladder complaints, if any:  no  Nausea/Vomiting, if any: no  Pain issues, if any:  Groin discomfort especially with sitting  SAFETY ISSUES:  Prior radiation? no  Pacemaker/ICD? no  Possible current pregnancy? no  Is the patient on methotrexate? no  Current Complaints / other details:  Married, g2, P2, post menopausal, last pap smear > 9 yrs ago. Worked for [employer] in [employer]. 02/18/14 PET Scan 10/14 Dr Chase Caller

## 2014-02-13 NOTE — Telephone Encounter (Signed)
Received a call from Julie(pt's daughter) she was very upset because The PET machine at [facility] was down today. The pet was to be done prior to her appointment with Dr .Isidore Moos on 02/16/2014. Almyra Free asked that the PET be arranged at another location because she did not want to wait until [place] 7,2015 when [facility] had rescheduled the scan. She stated "I want to get a 2nd opinion for my mom if this scan can not be earlier". [place] was called and a PET scan was scheduled for 02/16/2014. Santiago Glad in Radiation was contacted to make sure that it was still appropriate that Ms. Vandeberg keep her appointment with Dr. Isidore Moos as it is the same day as her scan and results may not be available. Per karen Dr. Isidore Moos confirmed pt should still keep her appointment. Pt was contacted and given information concerning both appointments . Results from the PET will be faxed to [place] and forwarded to Radiation.

## 2014-02-16 ENCOUNTER — Ambulatory Visit
Admission: RE | Admit: 2014-02-16 | Discharge: 2014-02-16 | Disposition: A | Discharge status: Home / Self Care | Payer: BC Managed Care – PPO | Source: Ambulatory Visit | Attending: Radiation Oncology | Admitting: Radiation Oncology

## 2014-02-16 ENCOUNTER — Encounter: Payer: Self-pay | Admitting: Radiation Oncology

## 2014-02-16 VITALS — BP 121/51 | HR 67 | Temp 98.2°F | Resp 20 | Ht 61.0 in | Wt 129.0 lb

## 2014-02-16 DIAGNOSIS — C519 Malignant neoplasm of vulva, unspecified: Secondary | ICD-10-CM | POA: Diagnosis not present

## 2014-02-16 DIAGNOSIS — Z510 Encounter for antineoplastic radiation therapy: Secondary | ICD-10-CM | POA: Diagnosis not present

## 2014-02-16 HISTORY — DX: Malignant (primary) neoplasm, unspecified: C80.1

## 2014-02-16 NOTE — Progress Notes (Signed)
Please see the Nurse Progress Note in the MD Initial Consult Encounter for this patient. 

## 2014-02-16 NOTE — Progress Notes (Addendum)
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 Primary vulvar cancer 184.4 C51.9 Ambulatory referral to Social Work     Ambulatory referral to Social Work     Ambulatory referral to Hematology / Oncology     Ambulatory referral to Nutrition and Diabetic Education     CT Biopsy     Ambulatory referral to Social Work    HISTORY OF PRESENT ILLNESS::Jody Beard is a 63 y.o. female who presented with a right pruritic vulvar mass 7-8 months ago. The lesion increased in size to 7cm and extended into 4cm on the distal vagina, vaginal submucosa, and urethra.  Satellite lesions were appreciated on the inner thigh and mons with bilateral inguinal adenopathy. Noted new lesions on the thigh and mons within the past few weeks. Vulvar biopsy by Dr Skeet Latch on 9-24 showed INVASIVE SQUAMOUS CELL CARCINOMA, ARISING IN A BACKGROUND OF HIGH GRADE SQUAMOUS INTRAEPITHELIAL LESION (VIN USUAL TYPE/VIN-III). Inner thigh biopsy also showed invasive SCC.  CT chest on 9-28 showed Mass within the right lower lobe measures 3.7 x 3.5 x 3.3 cm. There is a pulmonary nodule in the right lower lobe measuring 3 mm, image 42/ series 5. Nonspecific. Faint left upper lobe nodule measures 4 mm, image 17/series 5.  PET pending for 10/7  Surgery plans on hold.  No med/onc appt yet  Patient denies wt loss but would like to see nutritionist.  Has some constipation, but denies bleeding in stool/urine.  Burns to urinate over perineum.  Denies incontinence.  Dr Chase Caller of pulmonary medicine sees her on 10-14.  She has a positive smoking history, of note.  1ppd for 30 years. Quit 07/2013.    PREVIOUS RADIATION THERAPY: No  PAST MEDICAL HISTORY:  has a past medical history of Vulvar lesion; Eczema; H/O bronchitis; Headache(784.0); Vulvar cancer (01/2014); and Cancer (01/2014).    PAST SURGICAL HISTORY: Past Surgical History  Procedure Laterality Date  . Vulva /perineum biopsy  02/05/14  . Tubal ligation  1980's  . Dilation and curettage of uterus  1980's    FAMILY HISTORY: family history includes Heart disease in her mother; Kidney cancer in her father; Prostate cancer in her cousin.  SOCIAL HISTORY:  reports that she quit smoking about 7 months ago. Her smoking use included Cigarettes. She smoked 0.50 packs per day. She has never used smokeless tobacco. She reports that she does not drink alcohol or use illicit drugs.  ALLERGIES: Penicillins  MEDICATIONS:  No current outpatient prescriptions on file.   No current facility-administered medications for this encounter.    REVIEW OF SYSTEMS:  Notable for that above.   PHYSICAL EXAM:  height is 5\' 1"  (1.549 m) and weight is 129 lb (58.514 kg). Her oral temperature is 98.2 F (36.8 C). Her blood pressure is 121/51 and her pulse is 67. Her respiration is 20.   General: Alert and oriented, in no acute distress HEENT: Head is normocephalic. Pupils are equally round and reactive to light. Extraocular movements are intact. Oropharynx is clear. Neck: Neck is supple, no palpable cervical or supraclavicular lymphadenopathy. Heart: Regular in rate and rhythm with no murmurs, rubs, or gallops. Chest: Clear to auscultation bilaterally,  with no rhonchi, wheezes, or rales. Abdomen: Soft, nontender, nondistended, with no rigidity or guarding. Extremities: No cyanosis or edema. Lymphatics: at least 3 hard, enlarged groin nodes, bilateral (2 on R, 1 on L) Skin: No concerning lesions. Musculoskeletal: symmetric strength and muscle tone throughout. Neurologic: Cranial nerves II through XII are grossly intact. No obvious focalities. Speech  is fluent. Coordination is intact. Psychiatric: Judgment and insight are intact. Affect is appropriate. GYN:  Bulky multilobulated vulvar mass ~7cm, centered over right vulva, extending to urethra, and through proximal 1/3 of vaginal vault.  Not involving anus.  + satellite lesions over pubic region and upper inner right thigh.     ECOG = 1  0 - Asymptomatic (Fully active, able to carry on all predisease activities without restriction)  1 - Symptomatic but completely ambulatory (Restricted in physically strenuous activity but ambulatory and able to carry out work of a light or sedentary nature. For example, light housework, office work)  2 - Symptomatic, <50% in bed during the day (Ambulatory and capable of all self care but unable to carry out any work activities. Up and about more than 50% of waking hours)  3 - Symptomatic, >50% in bed, but not bedbound (Capable of only limited self-care, confined to bed or chair 50% or more of waking hours)  4 - Bedbound (Completely disabled. Cannot carry on any self-care. Totally confined to bed or chair)  5 - Death   Eustace Pen MM, Creech RH, Tormey DC, et al. [phone]). "Toxicity and response criteria of the [facility]". [place] Oncol. 5 (6): 649-55   LABORATORY DATA:  Lab Results  Component Value Date   WBC 9.2 02/06/2014   HGB 13.4 02/06/2014   HCT 42.1 02/06/2014   MCV 89.6 02/06/2014   PLT 304 02/06/2014   CMP     Component Value Date/Time   NA 140 02/06/2014 1110   K 4.9 02/06/2014 1110   CL 101 02/06/2014 1110   CO2 26 02/06/2014 1110   GLUCOSE 105* 02/06/2014 1110   BUN 8 02/06/2014 1110   CREATININE 0.72 02/06/2014 1110   CALCIUM 10.3 02/06/2014 1110   PROT 8.2 02/06/2014 1110   ALBUMIN 4.3 02/06/2014 1110   AST 15 02/06/2014 1110   ALT 11 02/06/2014 1110   ALKPHOS 97 02/06/2014 1110   BILITOT 0.3 02/06/2014 1110   GFRNONAA >90 02/06/2014 1110   GFRAA >90 02/06/2014 1110         RADIOGRAPHY: Chest 2 View  02/06/2014    CLINICAL DATA:  Vulvar carcinoma  EXAM: CHEST  2 VIEW  COMPARISON:  April 11, 2012  FINDINGS: There is a mass in the left lower lobe measuring 3.6 x 3.6 x 3.6 cm. There is underlying emphysema. There is no edema or consolidation. The heart size and pulmonary vascularity are normal. There is no demonstrable adenopathy. There is degenerative change in the thoracic spine. No blastic lytic bone lesions.  IMPRESSION: 3.6 x 3.6 x 3.6 cm left lower lobe mass, felt to be consistent with neoplasm. Contrast enhanced chest CT advised to further assess. Underlying emphysema.  These results will be called to the ordering clinician or representative by the Radiologist Assistant, and communication documented in the PACS or zVision Dashboard.   Electronically Signed   By: Lowella Grip M.D.   On: 02/06/2014 11:29   Ct Chest W Contrast  02/09/2014   CLINICAL DATA:  Evaluate lung mass.  History of vulvar cancer  EXAM: CT CHEST WITH CONTRAST  TECHNIQUE: Multidetector CT imaging of the chest was performed during intravenous contrast administration.  CONTRAST:  45mL OMNIPAQUE IOHEXOL 300 MG/ML  SOLN  COMPARISON:  None.  FINDINGS: Mediastinum: Mild calcified atherosclerotic disease involves the thoracic aorta. The heart size is normal. There is no pericardial effusion. The trachea appears patent and is midline. The esophagus is unremarkable. No enlarged mediastinal or hilar lymph nodes. There is no axillary or supraclavicular adenopathy.  Lungs/Pleura: No pleural effusion. Mild changes of centrilobular emphysema. Mass within the right lower lobe measures 3.7 x 3.5 x 3.3 cm. There is a pulmonary nodule in the right lower lobe measuring 3 mm, image 42/ series 5. Nonspecific. Faint left upper lobe nodule measures 4 mm, image 17/series 5.  Upper Abdomen: Incidental imaging through the upper abdomen is unremarkable. The visualized portions of the liver and spleen are normal. The adrenal glands are unremarkable.  Musculoskeletal:  Review of the visualized osseous structures is negative for aggressive lytic or sclerotic bone lesion.  IMPRESSION: 1. Mass within the left lower lobe is concerning for malignancy. If this is a non-small cell lung cancer then this would be considered a T2aN0M0 or Stage 1B.  2. Small nodule in the right lower lobe and left upper lobe are nonspecific.  3.  Atherosclerotic disease  4.  Emphysema.   Electronically Signed   By: Kerby Moors M.D.   On: 02/09/2014 14:39      IMPRESSION/PLAN: Lovely 63 yo with vulvar SCC, locally advanced, and lung lesion  1) PET scan in 2 days  2) Nutritionist consulted at patient request - risk of weight loss  3)  CT guided biopsy of lung ordered.  She understands that the biopsy could show a different primary, vs SCC (which may of may not be a 2nd primary vs metastasis).  If PET is negative elsewhere, she would warrant getting the benefit of the doubt and aggressive treatment to cure both sites as follows:  ChRT --> surgery (pelvis/groin) plus RT to lung disease.  I suspect neoadjuvant treatment would allow less morbid surgery, but this is to be discussed with GYN ONC  4) Refer to med/onc  5) Distress - refer to SW  It was a pleasure meeting the patient today.   We discussed the risks, benefits, and side effects of radiotherapy to lung and pelvis/groin. Side effects may include but not necessarily be limited to: fatigue, skin breakdown, dysuria, urinary urgency/frequency, nausea, diarrhea, rectal pain. No guarantees of treatment were given. A consent form was signed and placed in the patient's medical record. Simulation is pending other appointments, such as PET, med/onc, biopsy.   __________________________________________   [name], MD

## 2014-02-17 ENCOUNTER — Encounter: Payer: Self-pay | Admitting: *Deleted

## 2014-02-17 ENCOUNTER — Encounter: Payer: Self-pay | Admitting: Radiation Oncology

## 2014-02-17 NOTE — Progress Notes (Signed)
[place] Psychosocial Distress Screening Clinical Social Work  Clinical Social Work was referred by distress screening protocol.  The patient scored a 5 on the Psychosocial Distress Thermometer which indicates moderate distress. Clinical Social Worker contacted patient at home to assess for distress and other psychosocial needs.  Patient stated she was doing "ok", and was "just ready to get it started".  CSW and patient discussed and normalized common feelings and concerns associated with starting treatment.  CSW informed patient of the support team and support services at [facility], and encouraged patient to call with questions or concerns.        Clinical Social Worker follow up needed: No.   ONCBCN DISTRESS SCREENING 02/16/2014  Screening Type Initial Screening  Mark the number that describes how much distress you have been experiencing in the past week 5  Practical problem type Work/school  Emotional problem type Nervousness/Anxiety;Adjusting to illness;Boredom  Spiritual/Religous concerns type Relating to God  Information Concerns Type Lack of info about treatment;Lack of info about maintaining fitness  Physical Problem type Sleep/insomnia  Other "sleep " listed as most distressing   Johnnye Lana, MSW, LCSW, OSW-C Clinical Social Worker [place] [phone]

## 2014-02-18 ENCOUNTER — Encounter (HOSPITAL_COMMUNITY)
Admission: RE | Admit: 2014-02-18 | Discharge: 2014-02-18 | Disposition: A | Discharge status: Home / Self Care | Payer: BC Managed Care – PPO | Source: Ambulatory Visit | Attending: Gynecologic Oncology | Admitting: Gynecologic Oncology

## 2014-02-18 ENCOUNTER — Other Ambulatory Visit: Payer: Self-pay | Admitting: Radiation Oncology

## 2014-02-18 ENCOUNTER — Telehealth: Payer: Self-pay | Admitting: *Deleted

## 2014-02-18 DIAGNOSIS — N9089 Other specified noninflammatory disorders of vulva and perineum: Secondary | ICD-10-CM | POA: Insufficient documentation

## 2014-02-18 DIAGNOSIS — R918 Other nonspecific abnormal finding of lung field: Secondary | ICD-10-CM

## 2014-02-18 LAB — GLUCOSE, CAPILLARY: Glucose-Capillary: 105 mg/dL — ABNORMAL HIGH (ref 70–99)

## 2014-02-18 MED ORDER — FLUDEOXYGLUCOSE F - 18 (FDG) INJECTION
6.3000 | Freq: Once | INTRAVENOUS | Status: AC | PRN
  Administered 2014-02-18: 6.3 via INTRAVENOUS

## 2014-02-18 NOTE — Telephone Encounter (Signed)
Called patient to inform of sim appt., nutrition and PFT appt. For 02-20-14, spoke with patient and she is good with these appts.

## 2014-02-19 ENCOUNTER — Telehealth: Payer: Self-pay | Admitting: Oncology

## 2014-02-19 ENCOUNTER — Encounter (HOSPITAL_COMMUNITY): Payer: Self-pay | Admitting: Pharmacy Technician

## 2014-02-19 ENCOUNTER — Telehealth: Payer: Self-pay | Admitting: *Deleted

## 2014-02-19 NOTE — Telephone Encounter (Signed)
S/W PATIENT AND GAVE NP APPT FOR 10/16 @ 3 W/DR. L[place], [place] EDU 10/13 @ 12.

## 2014-02-19 NOTE — Telephone Encounter (Signed)
C/D 02/19/14 for appt. 02/27/14

## 2014-02-19 NOTE — Telephone Encounter (Signed)
Pt called to verify she received appt for Dr. Julien Nordmann on Oct 14th. Discussed with pt her scheduled appts. Pt verbalized understanding.

## 2014-02-19 NOTE — Telephone Encounter (Signed)
Pt to see Dr.Mohamed for Lung, appt oct 15 at 3:15pm. Called pt unable to reach lmovm to call office. Pt has CT Lung Bx scheduled for Oct 12.

## 2014-02-20 ENCOUNTER — Ambulatory Visit: Payer: BC Managed Care – PPO | Admitting: Nutrition

## 2014-02-20 ENCOUNTER — Ambulatory Visit (HOSPITAL_COMMUNITY)
Admission: RE | Admit: 2014-02-20 | Discharge: 2014-02-20 | Disposition: A | Discharge status: Home / Self Care | Payer: BC Managed Care – PPO | Source: Ambulatory Visit | Attending: Radiation Oncology | Admitting: Radiation Oncology

## 2014-02-20 ENCOUNTER — Ambulatory Visit
Admission: RE | Admit: 2014-02-20 | Discharge: 2014-02-20 | Disposition: A | Discharge status: Home / Self Care | Payer: BC Managed Care – PPO | Source: Ambulatory Visit | Attending: Radiation Oncology | Admitting: Radiation Oncology

## 2014-02-20 VITALS — BP 111/81 | HR 81 | Temp 98.2°F | Ht 61.0 in | Wt 130.4 lb

## 2014-02-20 DIAGNOSIS — Z510 Encounter for antineoplastic radiation therapy: Secondary | ICD-10-CM | POA: Diagnosis not present

## 2014-02-20 DIAGNOSIS — R918 Other nonspecific abnormal finding of lung field: Secondary | ICD-10-CM | POA: Diagnosis not present

## 2014-02-20 DIAGNOSIS — C519 Malignant neoplasm of vulva, unspecified: Secondary | ICD-10-CM

## 2014-02-20 DIAGNOSIS — F17290 Nicotine dependence, other tobacco product, uncomplicated: Secondary | ICD-10-CM | POA: Insufficient documentation

## 2014-02-20 DIAGNOSIS — C3432 Malignant neoplasm of lower lobe, left bronchus or lung: Secondary | ICD-10-CM

## 2014-02-20 LAB — PULMONARY FUNCTION TEST
DL/VA % pred: 64 %
DL/VA: 2.85 ml/min/mmHg/L
DLCO COR % PRED: 66 %
DLCO COR: 13.51 ml/min/mmHg
DLCO unc % pred: 66 %
DLCO unc: 13.51 ml/min/mmHg
FEF 25-75 Post: 0.75 L/sec
FEF 25-75 Pre: 0.47 L/sec
FEF2575-%CHANGE-POST: 58 %
FEF2575-%PRED-POST: 35 %
FEF2575-%PRED-PRE: 22 %
FEV1-%Change-Post: 13 %
FEV1-%PRED-PRE: 57 %
FEV1-%Pred-Post: 65 %
FEV1-POST: 1.45 L
FEV1-PRE: 1.28 L
FEV1FVC-%Change-Post: -1 %
FEV1FVC-%PRED-PRE: 64 %
FEV6-%CHANGE-POST: 16 %
FEV6-%PRED-POST: 96 %
FEV6-%Pred-Pre: 83 %
FEV6-Post: 2.7 L
FEV6-Pre: 2.32 L
FEV6FVC-%Change-Post: 0 %
FEV6FVC-%Pred-Post: 95 %
FEV6FVC-%Pred-Pre: 94 %
FVC-%Change-Post: 15 %
FVC-%PRED-POST: 101 %
FVC-%Pred-Pre: 87 %
FVC-Post: 2.95 L
FVC-Pre: 2.54 L
POST FEV1/FVC RATIO: 49 %
PRE FEV1/FVC RATIO: 50 %
Post FEV6/FVC ratio: 92 %
Pre FEV6/FVC Ratio: 91 %
RV % pred: 147 %
RV: 2.75 L
TLC % PRED: 116 %
TLC: 5.38 L

## 2014-02-20 MED ORDER — ALBUTEROL SULFATE (2.5 MG/3ML) 0.083% IN NEBU
2.5000 mg | INHALATION_SOLUTION | Freq: Once | RESPIRATORY_TRACT | Status: AC
  Administered 2014-02-20: 2.5 mg via RESPIRATORY_TRACT

## 2014-02-20 MED ORDER — SODIUM CHLORIDE 0.9 % IJ SOLN
10.0000 mL | Freq: Once | INTRAMUSCULAR | Status: AC
  Administered 2014-02-20: 10 mL via INTRAVENOUS

## 2014-02-20 NOTE — Progress Notes (Signed)
IV catheter removed, intact,  left wrist area, 2x2 gause placed over site, taped, and secured,patint tolerated well, no c./o pain, gave instructions to leave gause in pl;ace for a few hours and when removing can replace with bandaid if needed, ice if site become bruised, verbal understanding 11:50 AM

## 2014-02-20 NOTE — Progress Notes (Signed)
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  She is a patient of Dr. Isidore Moos.  Past medical history includes bronchitis, headaches, and eczema.  Medications were reviewed.  Labs include glucose of 105 on September 25.  Height: 61 inches. Weight: 130 pounds. Usual body weight: 130 pounds. BMI: 24.65.  Patient is experiencing no symptoms or side effects at this time.  She is requesting information on how to eat well through treatment for her cancer.  Nutrition diagnosis: Food and nutrition related knowledge deficit related to new diagnosis of cancer as evidenced by no prior need for nutrition related information.  Intervention: Patient educated to consume smaller, more frequent meals with higher calorie, higher protein foods to promote weight maintenance. Assisted patient with meal planning, encouraging her to continue smoothies first thing in the morning as she refuses to eat breakfast. Provided patient with samples of protein powders and oral nutrition supplements. Provided fact sheets on increasing calories and protein and diarrhea. Questions were answered.  Teach back method used.  Patient was given my contact information.  Monitoring, evaluation, goals: Patient will tolerate adequate calories and protein to promote weight maintenance.  Next visit: Patient will contact me for questions or concerns.  **Disclaimer: This note was dictated with voice recognition software. Similar sounding words can inadvertently be transcribed and this note may contain transcription errors which may not have been corrected upon publication of note.**

## 2014-02-21 ENCOUNTER — Other Ambulatory Visit: Payer: Self-pay | Admitting: Radiology

## 2014-02-22 NOTE — Progress Notes (Signed)
  Radiation Oncology         [phone] ________________________________  Name: Aryaa Bunting MRN: [id number]  Date: 02/20/2014  DOB: [birth year]  SIMULATION AND TREATMENT PLANNING NOTE  Outpatient  DIAGNOSIS:     ICD-9-CM ICD-10-CM  1. 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 Malignant neoplasm of lower lobe of left lung 162.5 C34.32    COMPLEX CT SIMULATION / TREATMENT PLANNING NOTE FOR LUNG  The patient was positioned on the CT simulator in a complex treatment device custom fitted to their body: A body fix blue bag. The patient's head was in an Accuform.  The patient's arms were over their head. An abdominal compression device was snugly fitted to decrease the patient's intrathoracic movements. High-resolution CT axial imaging with 4D technology and with thin slices was obtained of the chest. I verified that the images were good for treatment planning. Physics was on hand for the simulation due to the highly technical nature of it.  I contoured the patient's ITV and increased ITV with tight margins for the PTV. I will prescribe 54 Gy in 3 fractions of 18 Gy per fraction every other day. I requested a DVH of the patient's lungs, target volumes, esophagus, heart, spinal cord and airways for 3D conformal planning.  Cone beam CT scans will be performed prior to each fraction to allow close PTV margins and sparing of normal tissues from high doses.   COMPLEX CT SIMULATION / TREATMENT PLANNING NOTE FOR VULVA   T\the patient was set-up in a stable reproducible  supine position for radiation therapy; LEGS IN VACLOCK; Wiring around vulvar tumor.  CT images were obtained.  Surface markings were placed.  The CT images were loaded into the planning software.    TREATMENT PLANNING NOTE: Treatment planning then occurred.  The radiation prescription was entered and confirmed.    The patient will receive at total dose of 60 Gy in 30 fractions with coverage to the vulva, vagina, inguinal nodes, pelvic  nodes;  IMRT is needed to spare femoral heads, bladder, bowel.   A total of 3 medically necessary complex treatment devices were fabricated and supervised by me in this simulation. I have requested : Intensity Modulated Radiotherapy (IMRT) is medically necessary for this case for the following reason:  See above.  I have ordered: Nutrition Consult  Special Treatment Procedure Note: The patient will be receiving chemotherapy concurrently. Chemotherapy heightens the risk of side effects. I have considered this during the patient's treatment planning process and will monitor the patient accordingly for side effects on a weekly basis. Concurrent chemotherapy increases the complexity of this patient's treatment and therefore this constitutes a special treatment procedure.  -----------------------------------  [name], MD

## 2014-02-23 ENCOUNTER — Encounter (HOSPITAL_COMMUNITY): Payer: Self-pay

## 2014-02-23 ENCOUNTER — Ambulatory Visit (HOSPITAL_COMMUNITY)
Admission: RE | Admit: 2014-02-23 | Discharge: 2014-02-23 | Disposition: A | Discharge status: Home / Self Care | Payer: BC Managed Care – PPO | Source: Ambulatory Visit | Attending: Radiation Oncology | Admitting: Radiation Oncology

## 2014-02-23 ENCOUNTER — Telehealth: Payer: Self-pay | Admitting: Oncology

## 2014-02-23 ENCOUNTER — Other Ambulatory Visit (HOSPITAL_COMMUNITY): Payer: Self-pay | Admitting: Interventional Radiology

## 2014-02-23 ENCOUNTER — Encounter (HOSPITAL_COMMUNITY)
Admission: RE | Admit: 2014-02-23 | Discharge: 2014-02-23 | Disposition: A | Discharge status: Home / Self Care | Payer: BC Managed Care – PPO | Source: Ambulatory Visit | Attending: Interventional Radiology | Admitting: Interventional Radiology

## 2014-02-23 DIAGNOSIS — Z9889 Other specified postprocedural states: Secondary | ICD-10-CM

## 2014-02-23 DIAGNOSIS — Z87891 Personal history of nicotine dependence: Secondary | ICD-10-CM | POA: Insufficient documentation

## 2014-02-23 DIAGNOSIS — C519 Malignant neoplasm of vulva, unspecified: Secondary | ICD-10-CM

## 2014-02-23 DIAGNOSIS — D0222 Carcinoma in situ of left bronchus and lung: Secondary | ICD-10-CM | POA: Diagnosis not present

## 2014-02-23 LAB — PROTIME-INR
INR: 1.09 (ref 0.00–1.49)
Prothrombin Time: 14.1 seconds (ref 11.6–15.2)

## 2014-02-23 LAB — CBC WITH DIFFERENTIAL/PLATELET
BASOS ABS: 0 10*3/uL (ref 0.0–0.1)
Basophils Relative: 0 % (ref 0–1)
EOS PCT: 3 % (ref 0–5)
Eosinophils Absolute: 0.2 10*3/uL (ref 0.0–0.7)
HEMATOCRIT: 38.2 % (ref 36.0–46.0)
Hemoglobin: 12.5 g/dL (ref 12.0–15.0)
Lymphocytes Relative: 15 % (ref 12–46)
Lymphs Abs: 1.1 10*3/uL (ref 0.7–4.0)
MCH: 28.9 pg (ref 26.0–34.0)
MCHC: 32.7 g/dL (ref 30.0–36.0)
MCV: 88.2 fL (ref 78.0–100.0)
MONO ABS: 0.9 10*3/uL (ref 0.1–1.0)
Monocytes Relative: 13 % — ABNORMAL HIGH (ref 3–12)
Neutro Abs: 5.1 10*3/uL (ref 1.7–7.7)
Neutrophils Relative %: 69 % (ref 43–77)
PLATELETS: 203 10*3/uL (ref 150–400)
RBC: 4.33 MIL/uL (ref 3.87–5.11)
RDW: 14.1 % (ref 11.5–15.5)
WBC: 7.4 10*3/uL (ref 4.0–10.5)

## 2014-02-23 LAB — APTT: APTT: 32 s (ref 24–37)

## 2014-02-23 MED ORDER — SODIUM CHLORIDE 0.9 % IV SOLN
INTRAVENOUS | Status: DC
  Administered 2014-02-23: 08:00:00 via INTRAVENOUS

## 2014-02-23 MED ORDER — HYDROCODONE-ACETAMINOPHEN 5-325 MG PO TABS
1.0000 | ORAL_TABLET | ORAL | Status: DC | PRN
  Filled 2014-02-23: qty 2

## 2014-02-23 MED ORDER — FENTANYL CITRATE 0.05 MG/ML IJ SOLN
INTRAMUSCULAR | Status: AC | PRN
  Administered 2014-02-23: 25 ug via INTRAVENOUS
  Administered 2014-02-23: 50 ug via INTRAVENOUS

## 2014-02-23 MED ORDER — MIDAZOLAM HCL 2 MG/2ML IJ SOLN
INTRAMUSCULAR | Status: AC
  Filled 2014-02-23: qty 4

## 2014-02-23 MED ORDER — MIDAZOLAM HCL 2 MG/2ML IJ SOLN
INTRAMUSCULAR | Status: AC | PRN
  Administered 2014-02-23: 0.5 mg via INTRAVENOUS
  Administered 2014-02-23: 1 mg via INTRAVENOUS

## 2014-02-23 MED ORDER — LIDOCAINE HCL (PF) 1 % IJ SOLN
INTRAMUSCULAR | Status: AC
  Filled 2014-02-23: qty 30

## 2014-02-23 MED ORDER — SODIUM CHLORIDE 0.9 % IV SOLN
INTRAVENOUS | Status: AC | PRN
  Administered 2014-02-23: 10 mL/h via INTRAVENOUS

## 2014-02-23 MED ORDER — FENTANYL CITRATE 0.05 MG/ML IJ SOLN
INTRAMUSCULAR | Status: AC
  Filled 2014-02-23: qty 2

## 2014-02-23 NOTE — Procedures (Signed)
Interventional Radiology Procedure Note  Procedure: CT guided biopsy of LLL pulmonary mass Complications: No immediate Recommendations: - Bedrest until CXR cleared.  Minimize talking, coughing or otherwise straining.  - Follow up 2 hr CXR pending   Signed,  Criselda Peaches, MD

## 2014-02-23 NOTE — Discharge Instructions (Signed)
Needle Biopsy of Lung, Care After °Refer to this sheet in the next few weeks. These instructions provide you with information on caring for yourself after your procedure. Your health care provider may also give you more specific instructions. Your treatment has been planned according to current medical practices, but problems sometimes occur. Call your health care provider if you have any problems or questions after your procedure. °WHAT TO EXPECT AFTER THE PROCEDURE °· A bandage will be applied over the area where the needle was inserted. You may be asked to apply pressure to the bandage for several minutes to ensure there is minimal bleeding. °· In most cases, you can leave when your needle biopsy procedure is completed. Do not drive yourself home. Someone else should take you home. °· If you received an IV sedative or general anesthetic, you will be taken to a comfortable place to relax while the medicine wears off. °· If you have upcoming travel scheduled, talk to your health care provider about when it is safe to travel by air after the procedure. °HOME CARE INSTRUCTIONS °· Expect to take it easy for the rest of the day. °· Protect the area where you received the needle biopsy by keeping the bandage in place for as long as instructed. °· You may feel some mild pain or discomfort in the area, but this should stop in a day or two. °· Take medicines only as directed by your health care provider. °SEEK MEDICAL CARE IF:  °· You have pain at the biopsy site that worsens or is not helped by medicine. °· You have swelling or drainage at the needle biopsy site. °· You have a fever. °SEEK IMMEDIATE MEDICAL CARE IF:  °· You have new or worsening shortness of breath. °· You have chest pain. °· You are coughing up blood. °· You have bleeding that does not stop with pressure or a bandage. °· You develop light-headedness or fainting. °Document Released: 02/26/2007 Document Revised: 09/15/2013 Document Reviewed:  09/23/2012 °[employer]® Patient Information ©2015 [employer]. This information is not intended to replace advice given to you by your health care provider. Make sure you discuss any questions you have with your health care provider. ° °

## 2014-02-23 NOTE — H&P (Signed)
Chief Complaint: Vulvar Cancer New LLL mass  Referring Physician(s): Squire,Sarah  History of Present Illness: Jody Beard is a 63 y.o. female  Pt has been diagnosed with Vulvar cancer in 01/2014 Followed by Dr Isidore Moos Evaluation reveals Left lung mass on CXR 02/06/14 CT confirms mass 02/09/14 +PET 02/18/14 Now scheduled for biopsy of LLL mass Question metastasis or additional primary cancer Pt quit smoking 06/2013   Past Medical History  Diagnosis Date  . Vulvar lesion   . Eczema   . H/O bronchitis   . Headache(784.0)     migraines years ago  . Vulvar cancer 01/2014    future radiation and chemo  . Cancer 01/2014    vulvar    Past Surgical History  Procedure Laterality Date  . Vulva /perineum biopsy  02/05/14  . Tubal ligation  1980's  . Dilation and curettage of uterus  1980's    Allergies: Other and Penicillins  Medications: Prior to Admission medications   Not on File    Family History  Problem Relation Age of Onset  . Heart disease Mother   . Kidney cancer Father   . Prostate cancer Cousin     History   Social History  . Marital Status: Married    Spouse Name: N/A    Number of Children: N/A  . Years of Education: N/A   Social History Main Topics  . Smoking status: Former Smoker -- 0.50 packs/day    Types: Cigarettes    Quit date: 07/13/2013  . Smokeless tobacco: Never Used     Comment: current e-cig  . Alcohol Use: No     Comment: rare  . Drug Use: No  . Sexual Activity: None   Other Topics Concern  . None   Social History Narrative  . None    Review of Systems: A 12 point ROS discussed and pertinent positives are indicated in the HPI above.  All other systems are negative.  Review of Systems  Constitutional: Negative for activity change, appetite change and unexpected weight change.  Respiratory: Negative for cough, chest tightness and shortness of breath.   Cardiovascular: Negative for chest pain.  Gastrointestinal: Negative  for nausea and abdominal pain.  Skin: Negative for color change.  Psychiatric/Behavioral: Negative for behavioral problems and confusion.    Vital Signs: BP 128/76  Pulse 80  Temp(Src) 97.8 F (36.6 C) (Oral)  Resp 18  Ht 5\' 1"  (1.549 m)  Wt 58.968 kg (130 lb)  BMI 24.58 kg/m2  SpO2 98%  Physical Exam  Constitutional: She is oriented to person, place, and time. She appears well-nourished.  Cardiovascular: Normal rate and regular rhythm.   No murmur heard. Pulmonary/Chest: Effort normal and breath sounds normal. She has no wheezes.  Abdominal: Soft. Bowel sounds are normal. There is no tenderness.  Musculoskeletal: Normal range of motion.  Neurological: She is alert and oriented to person, place, and time.  Skin: Skin is warm and dry.  Psychiatric: She has a normal mood and affect. Her behavior is normal. Judgment and thought content normal.    Imaging: Chest 2 View  02/06/2014   CLINICAL DATA:  Vulvar carcinoma  EXAM: CHEST  2 VIEW  COMPARISON:  April 11, 2012  FINDINGS: There is a mass in the left lower lobe measuring 3.6 x 3.6 x 3.6 cm. There is underlying emphysema. There is no edema or consolidation. The heart size and pulmonary vascularity are normal. There is no demonstrable adenopathy. There is degenerative change in the thoracic  spine. No blastic lytic bone lesions.  IMPRESSION: 3.6 x 3.6 x 3.6 cm left lower lobe mass, felt to be consistent with neoplasm. Contrast enhanced chest CT advised to further assess. Underlying emphysema.  These results will be called to the ordering clinician or representative by the Radiologist Assistant, and communication documented in the PACS or zVision Dashboard.   Electronically Signed   By: Lowella Grip M.D.   On: 02/06/2014 11:29   Ct Chest W Contrast  02/09/2014   CLINICAL DATA:  Evaluate lung mass.  History of vulvar cancer  EXAM: CT CHEST WITH CONTRAST  TECHNIQUE: Multidetector CT imaging of the chest was performed during  intravenous contrast administration.  CONTRAST:  29mL OMNIPAQUE IOHEXOL 300 MG/ML  SOLN  COMPARISON:  None.  FINDINGS: Mediastinum: Mild calcified atherosclerotic disease involves the thoracic aorta. The heart size is normal. There is no pericardial effusion. The trachea appears patent and is midline. The esophagus is unremarkable. No enlarged mediastinal or hilar lymph nodes. There is no axillary or supraclavicular adenopathy.  Lungs/Pleura: No pleural effusion. Mild changes of centrilobular emphysema. Mass within the right lower lobe measures 3.7 x 3.5 x 3.3 cm. There is a pulmonary nodule in the right lower lobe measuring 3 mm, image 42/ series 5. Nonspecific. Faint left upper lobe nodule measures 4 mm, image 17/series 5.  Upper Abdomen: Incidental imaging through the upper abdomen is unremarkable. The visualized portions of the liver and spleen are normal. The adrenal glands are unremarkable.  Musculoskeletal: Review of the visualized osseous structures is negative for aggressive lytic or sclerotic bone lesion.  IMPRESSION: 1. Mass within the left lower lobe is concerning for malignancy. If this is a non-small cell lung cancer then this would be considered a T2aN0M0 or Stage 1B.  2. Small nodule in the right lower lobe and left upper lobe are nonspecific.  3.  Atherosclerotic disease  4.  Emphysema.   Electronically Signed   By: Kerby Moors M.D.   On: 02/09/2014 14:39   Nm Pet Image Initial (pi) Skull Base To Thigh  02/18/2014   CLINICAL DATA:  Initial evaluation of a 63 year old female with recently diagnosed left lower lobe mass. 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  EXAM: NUCLEAR MEDICINE PET SKULL BASE TO THIGH  TECHNIQUE: 6.3 mCi F-18 FDG was injected intravenously. Full-ring PET imaging was performed from the skull base to thigh after the radiotracer. CT data was obtained and used for attenuation correction and anatomic localization.   FASTING BLOOD GLUCOSE:  Value: 105 mg/dl  COMPARISON:  Chest CT 02/09/2014.  FINDINGS: NECK  No hypermetabolic lymph nodes in the neck.  CHEST  Previously described left lower lobe mass appears slightly increased in size, currently measuring 4.6 x 3.9 cm (image 44 of series 6) and is hypermetabolic (SUVmax = 68.3). There are multiple smaller pulmonary nodules all subcentimeter in size scattered throughout the lungs bilaterally, largest of which measures 5 mm in the right lower lobe (image 45 of series 6). These appear slightly more prominent than the recent prior examination, although accurate comparison between small pulmonary nodules is limited by slice thickness and other technical factors. No hypermetabolic mediastinal or hilar nodes. Mild diffuse bronchial wall thickening with mild centrilobular and paraseptal emphysema, most evident the lung apices bilaterally. No confluent consolidative airspace disease. No pleural effusions. Heart size is normal. There is no significant pericardial fluid, thickening or pericardial calcification.  ABDOMEN/PELVIS  Multiple hypermetabolic soft tissue masses in the right  vulvar region are noted, largest of which measures up to 4.4 x 2.9 cm (image 184 of series 2). These are all hypermetabolic (SUVmax = 32.9). This soft tissue thickening and hypermetabolism extends cephalad into the inferior aspect of the vaginal wall on the right side. There is extensive vulvar and bilateral inguinal lymphadenopathy, which is all hypermetabolic (SUVmax = 9.2-42.6), with the largest right inguinal lymph nodal mass measuring up to 4.0 x 2.6 cm (image 172 of series 2). There also enlarged right external iliac lymph nodes measuring up to 1.8 cm in short axis (SUVmax = 7.2) . No abnormal hypermetabolic activity within the liver, pancreas, adrenal glands, or spleen. Atherosclerosis throughout the abdominal and pelvic vasculature, without evidence of aneurysm. No significant volume of ascites. No  pneumoperitoneum. No pathologic dilatation of small bowel.  SKELETON  No focal hypermetabolic activity to suggest skeletal metastasis.  IMPRESSION: 1. 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 2. Interval enlargement of what is now a 4.6 x 3.9 cm hypermetabolic left lower lobe mass. Multiple other smaller pulmonary nodules also appear slightly more prominent than the prior study. It is uncertain whether or not this left lower lobe mass is a primary bronchogenic carcinoma, or a large pulmonary metastasis. The smaller pulmonary nodules are concerning for metastatic disease (either from a primary lung malignancy or the patient's primary right vulvar malignancy). No mediastinal or hilar lymphadenopathy noted at this time. 3. No other definite sites of metastatic disease.   Electronically Signed   By: Vinnie Langton M.D.   On: 02/18/2014 09:49    Labs:  CBC:  Recent Labs  02/06/14 1110 02/23/14 0730  WBC 9.2 7.4  HGB 13.4 12.5  HCT 42.1 38.2  PLT 304 203    COAGS:  Recent Labs  02/23/14 0730  INR 1.09  APTT 32    BMP:  Recent Labs  02/06/14 1110  NA 140  K 4.9  CL 101  CO2 26  GLUCOSE 105*  BUN 8  CALCIUM 10.3  CREATININE 0.72  GFRNONAA >90  GFRAA >90    LIVER FUNCTION TESTS:  Recent Labs  02/06/14 1110  BILITOT 0.3  AST 15  ALT 11  ALKPHOS 97  PROT 8.2  ALBUMIN 4.3    TUMOR MARKERS: No results found for this basename: AFPTM, CEA, CA199, CHROMGRNA,  in the last 8760 hours  Assessment and Plan:  Hx vulvar cancer LLL mass noted on CXR and CT +PET Now scheduled for LLL mass bx Pt aware of procedure benefits and risks and agreeable to proceed Consent signed and in  chart  Thank you for this interesting consult.  I greatly enjoyed meeting Mable Dara and look forward to participating in their care.    I spent a total of 20 minutes face to face in clinical consultation, greater than 50% of which was counseling/coordinating care for LLL mass bx  Signed: Kristene Liberati A 02/23/2014, 8:32 AM

## 2014-02-23 NOTE — Telephone Encounter (Signed)
LEFT MESSAGE FOR PATIENT TO RETURN CALL  °

## 2014-02-23 NOTE — Sedation Documentation (Signed)
16ml blood withdrawn from IV site for poss blood patch

## 2014-02-24 ENCOUNTER — Other Ambulatory Visit: Payer: BC Managed Care – PPO

## 2014-02-24 ENCOUNTER — Encounter: Payer: Self-pay | Admitting: *Deleted

## 2014-02-24 ENCOUNTER — Encounter: Payer: Self-pay | Admitting: Radiation Oncology

## 2014-02-24 DIAGNOSIS — Z510 Encounter for antineoplastic radiation therapy: Secondary | ICD-10-CM | POA: Diagnosis not present

## 2014-02-24 NOTE — Progress Notes (Signed)
I spoke with Jody Beard by phone re: lung path results.  SCC histology, (as was the vulvar tumor,) but appears more likely to be arising from lung. Proceed with SBRT as planned.   -----------------------------------  Jody Gibson, MD

## 2014-02-25 ENCOUNTER — Institutional Professional Consult (permissible substitution): Payer: BC Managed Care – PPO | Admitting: Internal Medicine

## 2014-02-26 ENCOUNTER — Ambulatory Visit: Payer: BC Managed Care – PPO | Admitting: Internal Medicine

## 2014-02-26 ENCOUNTER — Ambulatory Visit: Payer: BC Managed Care – PPO | Admitting: Radiation Oncology

## 2014-02-26 ENCOUNTER — Other Ambulatory Visit: Payer: Self-pay | Admitting: *Deleted

## 2014-02-26 DIAGNOSIS — C519 Malignant neoplasm of vulva, unspecified: Secondary | ICD-10-CM

## 2014-02-27 ENCOUNTER — Encounter: Payer: Self-pay | Admitting: Oncology

## 2014-02-27 ENCOUNTER — Ambulatory Visit (HOSPITAL_BASED_OUTPATIENT_CLINIC_OR_DEPARTMENT_OTHER): Payer: BC Managed Care – PPO | Admitting: Oncology

## 2014-02-27 ENCOUNTER — Other Ambulatory Visit: Payer: Self-pay | Admitting: Oncology

## 2014-02-27 ENCOUNTER — Ambulatory Visit: Payer: BC Managed Care – PPO

## 2014-02-27 ENCOUNTER — Other Ambulatory Visit (HOSPITAL_BASED_OUTPATIENT_CLINIC_OR_DEPARTMENT_OTHER): Payer: BC Managed Care – PPO

## 2014-02-27 ENCOUNTER — Ambulatory Visit
Admission: RE | Admit: 2014-02-27 | Discharge: 2014-02-27 | Disposition: A | Discharge status: Home / Self Care | Payer: BC Managed Care – PPO | Source: Ambulatory Visit | Attending: Radiation Oncology | Admitting: Radiation Oncology

## 2014-02-27 VITALS — BP 124/64 | HR 66 | Temp 98.1°F | Resp 18 | Ht 61.0 in | Wt 130.0 lb

## 2014-02-27 DIAGNOSIS — C519 Malignant neoplasm of vulva, unspecified: Secondary | ICD-10-CM

## 2014-02-27 DIAGNOSIS — Z87891 Personal history of nicotine dependence: Secondary | ICD-10-CM | POA: Insufficient documentation

## 2014-02-27 DIAGNOSIS — Z23 Encounter for immunization: Secondary | ICD-10-CM

## 2014-02-27 DIAGNOSIS — Z510 Encounter for antineoplastic radiation therapy: Secondary | ICD-10-CM | POA: Diagnosis not present

## 2014-02-27 DIAGNOSIS — C3432 Malignant neoplasm of lower lobe, left bronchus or lung: Secondary | ICD-10-CM

## 2014-02-27 DIAGNOSIS — C774 Secondary and unspecified malignant neoplasm of inguinal and lower limb lymph nodes: Secondary | ICD-10-CM

## 2014-02-27 DIAGNOSIS — C792 Secondary malignant neoplasm of skin: Secondary | ICD-10-CM

## 2014-02-27 LAB — COMPREHENSIVE METABOLIC PANEL (CC13)
ALT: 12 U/L (ref 0–55)
AST: 12 U/L (ref 5–34)
Albumin: 3.8 g/dL (ref 3.5–5.0)
Alkaline Phosphatase: 97 U/L (ref 40–150)
Anion Gap: 11 mEq/L (ref 3–11)
BUN: 12.2 mg/dL (ref 7.0–26.0)
CALCIUM: 10.8 mg/dL — AB (ref 8.4–10.4)
CHLORIDE: 104 meq/L (ref 98–109)
CO2: 26 meq/L (ref 22–29)
Creatinine: 0.8 mg/dL (ref 0.6–1.1)
Glucose: 93 mg/dl (ref 70–140)
Potassium: 4.4 mEq/L (ref 3.5–5.1)
SODIUM: 141 meq/L (ref 136–145)
Total Bilirubin: 0.39 mg/dL (ref 0.20–1.20)
Total Protein: 7.7 g/dL (ref 6.4–8.3)

## 2014-02-27 LAB — CBC WITH DIFFERENTIAL/PLATELET
BASO%: 0.6 % (ref 0.0–2.0)
BASOS ABS: 0 10*3/uL (ref 0.0–0.1)
EOS ABS: 0.2 10*3/uL (ref 0.0–0.5)
EOS%: 2.8 % (ref 0.0–7.0)
HCT: 40.5 % (ref 34.8–46.6)
HGB: 13 g/dL (ref 11.6–15.9)
LYMPH%: 18.3 % (ref 14.0–49.7)
MCH: 28.3 pg (ref 25.1–34.0)
MCHC: 32.1 g/dL (ref 31.5–36.0)
MCV: 88.3 fL (ref 79.5–101.0)
MONO#: 0.8 10*3/uL (ref 0.1–0.9)
MONO%: 9 % (ref 0.0–14.0)
NEUT%: 69.3 % (ref 38.4–76.8)
NEUTROS ABS: 6.2 10*3/uL (ref 1.5–6.5)
Platelets: 282 10*3/uL (ref 145–400)
RBC: 4.58 10*6/uL (ref 3.70–5.45)
RDW: 14.2 % (ref 11.2–14.5)
WBC: 8.9 10*3/uL (ref 3.9–10.3)
lymph#: 1.6 10*3/uL (ref 0.9–3.3)

## 2014-02-27 MED ORDER — LORAZEPAM 0.5 MG PO TABS: ORAL_TABLET | ORAL | Status: DC

## 2014-02-27 MED ORDER — ONDANSETRON HCL 8 MG PO TABS: 8.0000 mg | ORAL_TABLET | Freq: Three times a day (TID) | ORAL | Status: DC | PRN

## 2014-02-27 MED ORDER — INFLUENZA VAC SPLIT QUAD 0.5 ML IM SUSY
0.5000 mL | PREFILLED_SYRINGE | Freq: Once | INTRAMUSCULAR | Status: AC
  Administered 2014-02-27: 0.5 mL via INTRAMUSCULAR
  Filled 2014-02-27: qty 0.5

## 2014-02-27 NOTE — Progress Notes (Signed)
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 She is alone for the visit. Information is from EMR, history from patient and communication during past week from Drs Denman George and Isidore Moos.   Patient has had no regular medical care at least since moving to [place] several years ago. She has had gradually enlarging mass at right vulva for ~ 8 months, which has not been painful but is uncomfortable with direct pressure and has become ulcerated with occasional bleeding. She was seen in ED on 02-02-14 with right inguinal adenopathy and right vulvar masses, then seen by Dr Elonda Husky on 02-03-14, clinically with vulvar carcinoma. She was then seen in consultation by Dr Skeet Latch on 02-05-14, with biopsy of both vulvar mass and satellite lesions on inner thigh and mons. Her exam found 7 cm right vulvar mass with 4 cm extension to distal vagina, extending  anteriorly to urethral meatus and in ischiorectal area, with satellite lesions inner thigh and right mons. Pathology [id number]) showed invasive squamous cell carcinoma arising in background of high grade squamous intraepithelial lesion from vulva biopsy and invasive moderately differentiated squamous cell carcinoma from inner thigh lesion. CXR 02-06-14 showed left lower lobe lung mass 3.6 x 3.6 x 3.6 cm, with no adenopathy and no bony lesions, emphysematous changes. CT chest 02-09-14 showed the lower lobe mass 3.7 x 3.5 x 3.3 cm as well as 3 mm RLL  nodule and faint 4 mm LUL nodule, no mediastinal or hilar nodes, no axillary or supraclavicular nodes, and upper abdomen not remarkable. PET 02-18-14 measured LLL lung mass 4.6 x 3.9 cm, hypermetabolic, and noted multiple subcentimeter nodules scattered thruout lungs bilaterally; the right vulvar mass was also hypermetabolic, extending up into right side of lower vagina, extensive R>L and right external iliac adenopathy, no uptake in skeleton. She had CT biopsy of LLL pulmonary mass on 02-24-14, that pathology not yet available in EMR, tho I understand pathologist favors primary lung squamous cell carcinoma. After initial consideration of bilateral inguinal lymphadenectomy, plan is now to begin treatment with radiation. She had first of 3 stereotactic body radiation treatments to the LLL pulmonary mass today, with SBRT also planned 10-20 and 03-05-14. She will begin IMRT to vulvar involvement on 03-09-14, planned thru 04-24-14. She had chemotherapy education prior to this visit for weekly CDDP as radiation sensitizer.   Peripheral venous access has been good for all procedures. She received flu vaccine today. She does not have advance directives, but was given written information in this regard today.    REVIEW OF SYSTEMS as above, also: Usual weight past 5 years ~ 135. Appetite good. Bowels moving, voiding without difficulty. No fever. Some SOB walking up slight hill, little cough, NP, no hemoptysis. No chest pain. No other pain. No GERD. No N/V. No LE swelling. No HA including migraines since moved from [place]. No symptomatic dental problems. No sinus symptoms. No arthritis. No history blood clots. Last mammograms in [place]. Never colonoscopy.  Remainder of full 10 point review of systems negative.  ALLERGIES: Other and Penicillins   Rash with PCN. Excedrin migraine causes   PAST MEDICAL/ SURGICAL HISTORY:    G2P2 BTL Remote migraines  CURRENT MEDICATIONS: reviewed as listed now in EMR. Flu  vaccine given today. Prescriptions to be sent to pharmacy for zofran and ativan. She can use OTC benadryl if helpful for sleep at hs, otherwise will try ativan.  PHARMACY [facility]   SOCIAL HISTORY: originally from [place], then lived in [place] before moving to [place] ~ 8 years ago, as daughter and her family live in [place]. 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 Smoked 1 ppd x 30 years before quitting in Feb 2015. No ETOH. 4 grandchildren in [place], ages 69,9,11,14. Son in [place].   FAMILY HISTORY:  Cardiac disease mother, renal ca father, daughter with asthma, sister died age 75 renal disease, son and 4 grands healthy.          PHYSICAL EXAM:  Weight 130, hgt 5'1", BMI 24.6, 124/64, 66 regular, 18 not labored RA, 98.1 Alert, pleasant, cooperative lady looks stated age, good historian. Uncomfortable sitting in exam room.   HEENT: normal hair pattern, PERRL, not icteric. Oral mucosa moist and clear, posterior pharynx clear. Neck supple without JVD or thyroid mass  RESPIRATORY: Diminished breath sounds thruout without wheezes or crackles, no dullness to percussion, no use of accessory muscles.  CARDIAC/ VASCULAR: heart with regular rate and rhythm, no gallop, clear heart sounds. Peripheral pulses symmetrical  ABDOMEN: soft, nontender, not distended, no palpable mass or HSM. Normally active BS. Not tender in epigastrium  Perineum: firm tender mass ~ 4 x 7 cm right vulva with superficial ulcerations at posterior aspect. 1.5 cm erythematous nodular area inner right thigh.  LYMPH NODES: no cervical, supraclavicular, axillary adenopathy. Right inguinal mass at least 3x5 cm, firm, not mobile, no ulceration, slightly tender.  BREASTS: bilaterally without dominant mass, skin or nipple findings  NEUROLOGIC: CN, motor, sensory, cerebellar nonfocal. PSYCH appropriate mood and affect  SKIN: no ecchymoses, rash, petechiae  MUSCULOSKELETAL: no clubbing, cyanosis,  edema. Good peripheral veins in UE bilaterally. Back not tender. Good symmetrical muscle mass.    LABORATORY DATA:  Results for orders placed in visit on 02/27/14 (from the past 48 hour(s))  CBC WITH DIFFERENTIAL     Status: None   Collection Time    02/27/14  3:27 PM      Result Value Ref Range   WBC 8.9  3.9 - 10.3 10e3/uL   NEUT# 6.2  1.5 - 6.5 10e3/uL   HGB 13.0  11.6 - 15.9 g/dL   HCT 40.5  34.8 - 46.6 %   Platelets 282  145 - 400 10e3/uL   MCV 88.3  79.5 - 101.0 fL   MCH 28.3  25.1 - 34.0 pg   MCHC 32.1  31.5 - 36.0 g/dL   RBC 4.58  3.70 - 5.45 10e6/uL   RDW 14.2  11.2 - 14.5 %   lymph# 1.6  0.9 - 3.3 10e3/uL   MONO# 0.8  0.1 - 0.9 10e3/uL   Eosinophils Absolute 0.2  0.0 - 0.5 10e3/uL   Basophils Absolute 0.0  0.0 - 0.1 10e3/uL   NEUT% 69.3  38.4 - 76.8 %   LYMPH% 18.3  14.0 - 49.7 %   MONO% 9.0  0.0 - 14.0 %   EOS% 2.8  0.0 - 7.0 %   BASO% 0.6  0.0 - 2.0 %     CMET normal with exception of calcium 10.8, including creatinine 0.8,  normal LFTs, total protein 7.7 and albumin 3.8  PATHOLOGY: Rugg, Sukaina Collected: 02/05/2014 Client: [facility] Accession: [id number] Received: 02/06/2014 Janie Morning, MDATHOLOGY FINAL DIAGNOSIS Diagnosis 1. 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 - ONE LATERAL EDGE IS INVOLVED BY HIGH GRADE SQUAMOUS INTRAEPITHELIAL LESION (VIN-III/VIN USUAL TYPE). - ONE EDGE IS NEGATIVE FOR DYSPLASIA OR MALIGNANCY. 2. Skin Biopsy-(P), inner thigh - INVASIVE MODERATELY DIFFERENTIATED SQUAMOUS CELL CARCINOMA. PLEASE SEE COMMENT. Microscopic Comment 1. The biopsy section shows an invasive moderately differentiated squamous cell carcinoma. The invasive tumor measures 0.5 cm laterally and 0.5 cm in depth. There is no evidence of angiolymphatic invasion or perineural invasion identified in this material. Background high grade  squamous intraepithelial lesion (VIN-III/VIN usual type) is also present and involves one lateral edge. Clinical correlation is highly recommended. 2. The biopsy is completely involved by invasive moderately differentiated squamous cell carcinoma, measuring 0.4 cm in the greatest dimension. There is no evidence of angiolymphatic invasion  RADIOGRAPHY: CT CHEST WITH CONTRAST  TECHNIQUE:  Multidetector CT imaging of the chest was performed during  intravenous contrast administration.  CONTRAST: 69mL OMNIPAQUE IOHEXOL 300 MG/ML SOLN  COMPARISON: None.  FINDINGS:  Mediastinum: Mild calcified atherosclerotic disease involves the  thoracic aorta. The heart size is normal. There is no pericardial  effusion. The trachea appears patent and is midline. The esophagus  is unremarkable. No enlarged mediastinal or hilar lymph nodes. There  is no axillary or supraclavicular adenopathy.  Lungs/Pleura: No pleural effusion. Mild changes of centrilobular  emphysema. Mass within the right lower lobe measures 3.7 x 3.5 x 3.3  cm. There is a pulmonary nodule in the right lower lobe measuring 3  mm, image 42/ series 5. Nonspecific. Faint left upper lobe nodule  measures 4 mm, image 17/series 5.  Upper Abdomen: Incidental imaging through the upper abdomen is  unremarkable. The visualized portions of the liver and spleen are  normal. The adrenal glands are unremarkable.  Musculoskeletal: Review of the visualized osseous structures is  negative for aggressive lytic or sclerotic bone lesion.  IMPRESSION:  1. Mass within the left lower lobe is concerning for malignancy. If  this is a non-small cell lung cancer then this would be considered a  T2aN0M0 or Stage 1B.  2. Small nodule in the right lower lobe and left upper lobe are  nonspecific.  3. Atherosclerotic disease  4. Emphysema.      NUCLEAR MEDICINE PET SKULL BASE TO THIGH  TECHNIQUE:  6.3 mCi F-18 FDG was injected intravenously. Full-ring PET  imaging  was performed from the skull base to thigh after the radiotracer. CT  data was obtained and used for attenuation correction and anatomic  localization.  FASTING BLOOD GLUCOSE: Value: 105 mg/dl  COMPARISON: Chest CT 02/09/2014.  FINDINGS:  NECK  No hypermetabolic lymph nodes in the neck.  CHEST  Previously described left lower lobe mass appears slightly increased  in size, currently measuring 4.6 x 3.9 cm (image 44 of series 6) and  is hypermetabolic (SUVmax = 00.9). There are multiple smaller  pulmonary nodules all subcentimeter in size scattered throughout the  lungs bilaterally, largest of which measures 5 mm in the right lower  lobe (image 45 of series 6). These appear slightly more prominent  than the recent prior examination, although accurate comparison  between small pulmonary nodules is limited by slice thickness and  other technical factors. No hypermetabolic mediastinal or hilar  nodes. Mild diffuse bronchial wall thickening with mild  centrilobular and paraseptal emphysema, most evident the lung apices  bilaterally. No confluent consolidative airspace disease. No pleural  effusions. Heart size is normal. There is no significant pericardial  fluid, thickening or pericardial calcification.  ABDOMEN/PELVIS  Multiple hypermetabolic soft tissue masses in the right vulvar  region are noted, largest of which measures up to 4.4 x 2.9 cm  (image 184 of series 2). These are all hypermetabolic (SUVmax =  18.2). This soft tissue thickening and hypermetabolism extends  cephalad into the inferior aspect of the vaginal wall on the right  side. There is extensive vulvar and bilateral inguinal  lymphadenopathy, which is all hypermetabolic (SUVmax = 9.9-37.1),  with the largest right inguinal lymph nodal mass measuring up to 4.0  x 2.6 cm (image 172 of series 2). There also enlarged right external  iliac lymph nodes measuring up to 1.8 cm in short axis (SUVmax =  7.2) . No  abnormal hypermetabolic activity within the liver,  pancreas, adrenal glands, or spleen. Atherosclerosis throughout the  abdominal and pelvic vasculature, without evidence of aneurysm. No  significant volume of ascites. No pneumoperitoneum. No pathologic  dilatation of small bowel.  SKELETON  No focal hypermetabolic activity to suggest skeletal metastasis.  IMPRESSION:  1. Large right vulvar mass with multiple satellite lesions in the  right vulavr region, extension upwards into the right side of the  lower vagina, extensive bilateral inguinal (right greater than left)  and right external iliac lymphadenopathy, compatible with  biopsy-proven primary vulvar squamous cell carcinoma with regional  nodal metastases.  2. Interval enlargement of what is now a 4.6 x 3.9 cm hypermetabolic  left lower lobe mass. Multiple other smaller pulmonary nodules also  appear slightly more prominent than the prior study. It is uncertain  whether or not this left lower lobe mass is a primary bronchogenic  carcinoma, or a large pulmonary metastasis. The smaller pulmonary  nodules are concerning for metastatic disease (either from a primary  lung malignancy or the patient's primary right vulvar malignancy).  No mediastinal or hilar lymphadenopathy noted at this time.  3. No other definite sites of metastatic disease   All images of above studies reviewed in PACS by MD.    DISCUSSION: I have reviewed all of history and workup as above with patient now. She understands that the vulvar cancer is involving inguinal and pelvic lymph nodes as well as the satellite lesions on thigh and mons. She also has squamous cell carcinoma involving LLL lung, which may be primary lung or still may be metastatic; there are multiple additional bilateral pulmonary nodules suspicious for metastatic disease. I agree with Drs Isidore Moos and Denman George that radiation seems best initial intervention, and agree with recommendation for sensitizing  CDDP. Will need to reimage chest at least at completion of RT in early Dec, or sooner if worsening respiratory symptoms.  I have reviewed rationale for oral and IV hydration for CDDP, and discussed antiemetics and usual treatment plan. She understands that she can contact this office at any time if concerns prior to scheduled appointments. I have given her a written note for work, and expect she will need other work forms completed also. Patient is comfortable with our discussion, has had all questions answered and is in agreement with plan.     IMPRESSION / PLAN:  1.advanced squamous cell carcinoma of vulva involving distal vagina, right inguinal and external iliac nodes, with metastatic disease to skin of right thigh  and likely to lungs. She is symptomatic from extent of perineal disease. Plan IMRT beginning 03-09-14 with weekly CDDP. 2.Squamous cell carcinoma of LLL lung: long past tobacco, and imaging suggests primary lung, final path pending. Multiple bilateral subcentimeter pulmonary nodules concerning for metastatic disease. SBRT begun today to the LLL mass. Follow pulmonary symptoms closely as other areas are treated. 3.long past tobacco 4.flu vaccine given 5.history migraine HAs. Discussed possible HA with zofran. 6.allergy PCN 7.BTL    Patient has had questions answered to her satisfaction and is in agreement with plan above. She can contact this office for questions or concerns at any time prior to next scheduled visit.  Time spent >60 min, including >50% discussion and coordination of care.  Chemo orders entered. Consent obtained, prior auth requested.  LIVESAY,LENNIS P, MD 02/27/2014 3:43 PM

## 2014-02-27 NOTE — Progress Notes (Signed)
   Radiation Oncology         [phone] ________________________________  Name: Mehr Depaoli MRN: [id number]  Date: 02/27/2014  DOB: November 30, 1950  Stereotactic Body Radiotherapy Treatment Procedure Note   NARRATIVE: Fatuma Dowers was brought to the stereotactic radiation treatment machine and placed supine on the CT couch. The patient was set up for stereotactic body radiotherapy on the body fix pillow.   3D TREATMENT PLANNING AND DOSIMETRY: The patient's radiation plan was reviewed and approved prior to starting treatment. It showed 3-dimensional radiation distributions overlaid onto the planning CT. The [facility] for the target structures as well as the organs at risk were reviewed. The documentation of this is filed in the radiation oncology EMR.   SIMULATION VERIFICATION: The patient underwent CT imaging on the treatment unit. These were carefully aligned to document that the ablative radiation dose would cover the target volume and maximally spare the nearby organs at risk according to the planned distribution.   SPECIAL TREATMENT PROCEDURE: Alyson Reedy received high dose ablative stereotactic body radiotherapy to the planned target volume without unforeseen complications. Treatment was delivered uneventfully. The high doses associated with stereotactic body radiotherapy and the significant potential risks require careful treatment set up and patient monitoring constituting a special treatment procedure.   STEREOTACTIC TREATMENT MANAGEMENT: Following delivery, the patient was evaluated clinically. The patient tolerated treatment without significant acute effects, and was discharged to home in stable condition.   Fraction: 1  Dose:  18 Gy  PLAN: Continue treatment as planned.   ________________________________  Jodelle Gross, MD, PhD

## 2014-02-27 NOTE — Progress Notes (Signed)
Checked in new pt with no financial concerns at this time.  Pt has 2 insurances so financial assistance may not be needed but she has my card for any questions or concerns she may have in the future.

## 2014-02-28 ENCOUNTER — Other Ambulatory Visit: Payer: Self-pay | Admitting: Oncology

## 2014-03-02 ENCOUNTER — Inpatient Hospital Stay
Admission: RE | Admit: 2014-03-02 | Discharge: 2014-03-02 | Disposition: A | Discharge status: Home / Self Care | Payer: BC Managed Care – PPO | Source: Ambulatory Visit | Attending: Radiation Oncology | Admitting: Radiation Oncology

## 2014-03-02 ENCOUNTER — Ambulatory Visit: Payer: BC Managed Care – PPO | Admitting: Radiation Oncology

## 2014-03-02 ENCOUNTER — Ambulatory Visit
Admission: RE | Admit: 2014-03-02 | Discharge: 2014-03-02 | Disposition: A | Discharge status: Home / Self Care | Payer: BC Managed Care – PPO | Source: Ambulatory Visit | Attending: Radiation Oncology | Admitting: Radiation Oncology

## 2014-03-02 ENCOUNTER — Telehealth: Payer: Self-pay | Admitting: Oncology

## 2014-03-02 ENCOUNTER — Telehealth: Payer: Self-pay | Admitting: *Deleted

## 2014-03-02 VITALS — BP 101/55 | HR 73 | Temp 98.8°F | Resp 20 | Wt 129.5 lb

## 2014-03-02 DIAGNOSIS — Z510 Encounter for antineoplastic radiation therapy: Secondary | ICD-10-CM | POA: Diagnosis not present

## 2014-03-02 DIAGNOSIS — C3432 Malignant neoplasm of lower lobe, left bronchus or lung: Secondary | ICD-10-CM | POA: Insufficient documentation

## 2014-03-02 DIAGNOSIS — C519 Malignant neoplasm of vulva, unspecified: Secondary | ICD-10-CM

## 2014-03-02 MED ORDER — LIDOCAINE HCL 2 % EX GEL: 1.0000 "application " | CUTANEOUS | Status: DC | PRN

## 2014-03-02 NOTE — Progress Notes (Signed)
   Weekly Management Note:  Outpatient  Left lower lobe lung cancer, 162.5/C34.32  Current Dose:  36 Gy  Projected Dose: 54 Gy   Narrative:  The patient presents for routine under treatment assessment.  CBCT/MVCT images/Port film x-rays were reviewed.  The chart was checked. No new complaints  Physical Findings:    Vitals with Age-Percentiles 07/62/2633  Length   Systolic 354  Diastolic 55  Pulse 73  Respiration 20  Weight 58.741 kg  BMI   VISIT REPORT    NAD  Impression:  The patient is tolerating radiotherapy.  Plan:  Continue radiotherapy as planned. Biafine given for vulvar itching as well as Sitz bath and lidocaine Rx.  ________________________________   [name], M.D.

## 2014-03-02 NOTE — Telephone Encounter (Signed)
Per staff message and POF I have scheduled appts. Advised scheduler of appts. JMW  

## 2014-03-02 NOTE — Telephone Encounter (Signed)
Spoke with pt to pick up new sch. at her appt 03/02/14.

## 2014-03-02 NOTE — Progress Notes (Signed)
  Radiation Oncology         (336) [phone] ________________________________  Name: Jody Beard MRN: [id number]  Date: 03/02/2014  DOB: 01-16-1951  Stereotactic Body Radiotherapy Treatment Procedure Note    ICD-9-CM ICD-10-CM  1. Malignant neoplasm of lower lobe of left lung 162.5 C34.32    NARRATIVE:  Jody Beard was brought to the stereotactic radiation treatment machine and placed supine on the CT couch. The patient was set up for stereotactic body radiotherapy on the body fix pillow.  3D TREATMENT PLANNING AND DOSIMETRY:  The patient's radiation plan was reviewed and approved prior to starting treatment.  It showed 3-dimensional radiation distributions overlaid onto the planning CT.  The [facility] for the target structures as well as the organs at risk were reviewed. The documentation of this is filed in the radiation oncology EMR.  SIMULATION VERIFICATION:  The patient underwent CT imaging on the treatment unit.  These were carefully aligned to document that the ablative radiation dose would cover the target volume and maximally spare the nearby organs at risk according to the planned distribution.  SPECIAL TREATMENT PROCEDURE: Jody Beard received high dose ablative stereotactic body radiotherapy to the planned target volume without unforeseen complications. Treatment was delivered uneventfully. The high doses associated with stereotactic body radiotherapy and the significant potential risks require careful treatment set up and patient monitoring constituting a special treatment procedure   STEREOTACTIC TREATMENT MANAGEMENT:  Following delivery, the patient was evaluated clinically. The patient tolerated treatment without significant acute effects, and was discharged to home in stable condition.    PLAN: Continue treatment as planned.  ________________________________  [name], MD

## 2014-03-02 NOTE — Progress Notes (Signed)
Weekly assessment of radiation to left lung.Denies pain.No cough.Shortness of breath on exertion.Completed 2 of 3 treatments of SBRT.Has some discomfort of vulva area which she will start treatment soon.

## 2014-03-03 ENCOUNTER — Ambulatory Visit: Payer: BC Managed Care – PPO | Admitting: Radiation Oncology

## 2014-03-04 ENCOUNTER — Ambulatory Visit
Admission: RE | Admit: 2014-03-04 | Discharge: 2014-03-04 | Disposition: A | Discharge status: Home / Self Care | Payer: BC Managed Care – PPO | Source: Ambulatory Visit | Attending: Radiation Oncology | Admitting: Radiation Oncology

## 2014-03-04 ENCOUNTER — Ambulatory Visit: Payer: BC Managed Care – PPO | Admitting: Radiation Oncology

## 2014-03-04 ENCOUNTER — Encounter: Payer: Self-pay | Admitting: Radiation Oncology

## 2014-03-04 DIAGNOSIS — C3432 Malignant neoplasm of lower lobe, left bronchus or lung: Secondary | ICD-10-CM

## 2014-03-04 DIAGNOSIS — Z510 Encounter for antineoplastic radiation therapy: Secondary | ICD-10-CM | POA: Diagnosis not present

## 2014-03-04 NOTE — Progress Notes (Signed)
  Radiation Oncology         [phone]) [phone] ________________________________  Name: Jody Beard MRN: [id number]  Date: 03/04/2014  DOB: 1950-09-14  Stereotactic Body Radiotherapy Treatment Procedure Note Final 3rd treatment - 54Gy total    ICD-9-CM ICD-10-CM  1. Malignant neoplasm of lower lobe of left lung 162.5 C34.32    NARRATIVE:  Jody Beard was brought to the stereotactic radiation treatment machine and placed supine on the CT couch. The patient was set up for stereotactic body radiotherapy on the body fix pillow.  3D TREATMENT PLANNING AND DOSIMETRY:  The patient's radiation plan was reviewed and approved prior to starting treatment.  It showed 3-dimensional radiation distributions overlaid onto the planning CT.  The [facility] for the target structures as well as the organs at risk were reviewed. The documentation of this is filed in the radiation oncology EMR.  SIMULATION VERIFICATION:  The patient underwent CT imaging on the treatment unit.  These were carefully aligned to document that the ablative radiation dose would cover the target volume and maximally spare the nearby organs at risk according to the planned distribution.  SPECIAL TREATMENT PROCEDURE: Jody Beard received high dose ablative stereotactic body radiotherapy to the planned target volume without unforeseen complications. Treatment was delivered uneventfully. The high doses associated with stereotactic body radiotherapy and the significant potential risks require careful treatment set up and patient monitoring constituting a special treatment procedure   STEREOTACTIC TREATMENT MANAGEMENT:  Following delivery, the patient was evaluated clinically. The patient tolerated treatment without significant acute effects, and was discharged to home in stable condition.    PLAN: f/u on 10-26 to start RT to pelvis.  ________________________________  [name], MD

## 2014-03-05 ENCOUNTER — Ambulatory Visit: Payer: BC Managed Care – PPO | Admitting: Radiation Oncology

## 2014-03-08 ENCOUNTER — Other Ambulatory Visit: Payer: Self-pay | Admitting: Oncology

## 2014-03-09 ENCOUNTER — Other Ambulatory Visit (HOSPITAL_BASED_OUTPATIENT_CLINIC_OR_DEPARTMENT_OTHER): Payer: BC Managed Care – PPO

## 2014-03-09 ENCOUNTER — Ambulatory Visit
Admission: RE | Admit: 2014-03-09 | Discharge: 2014-03-09 | Disposition: A | Discharge status: Home / Self Care | Payer: BC Managed Care – PPO | Source: Ambulatory Visit | Attending: Radiation Oncology | Admitting: Radiation Oncology

## 2014-03-09 ENCOUNTER — Ambulatory Visit (HOSPITAL_BASED_OUTPATIENT_CLINIC_OR_DEPARTMENT_OTHER): Payer: BC Managed Care – PPO

## 2014-03-09 ENCOUNTER — Other Ambulatory Visit: Payer: Self-pay | Admitting: *Deleted

## 2014-03-09 ENCOUNTER — Encounter: Payer: Self-pay | Admitting: Oncology

## 2014-03-09 ENCOUNTER — Telehealth: Payer: Self-pay | Admitting: Oncology

## 2014-03-09 ENCOUNTER — Encounter: Payer: Self-pay | Admitting: Radiation Oncology

## 2014-03-09 ENCOUNTER — Ambulatory Visit (HOSPITAL_BASED_OUTPATIENT_CLINIC_OR_DEPARTMENT_OTHER): Payer: BC Managed Care – PPO | Admitting: Oncology

## 2014-03-09 VITALS — BP 106/82 | HR 75 | Temp 98.0°F | Resp 12 | Wt 130.0 lb

## 2014-03-09 VITALS — BP 119/60 | HR 81 | Temp 98.1°F | Resp 18 | Wt 129.1 lb

## 2014-03-09 DIAGNOSIS — C519 Malignant neoplasm of vulva, unspecified: Secondary | ICD-10-CM

## 2014-03-09 DIAGNOSIS — Z5111 Encounter for antineoplastic chemotherapy: Secondary | ICD-10-CM

## 2014-03-09 DIAGNOSIS — C3432 Malignant neoplasm of lower lobe, left bronchus or lung: Secondary | ICD-10-CM

## 2014-03-09 DIAGNOSIS — Z510 Encounter for antineoplastic radiation therapy: Secondary | ICD-10-CM | POA: Diagnosis not present

## 2014-03-09 DIAGNOSIS — G893 Neoplasm related pain (acute) (chronic): Secondary | ICD-10-CM

## 2014-03-09 DIAGNOSIS — C3492 Malignant neoplasm of unspecified part of left bronchus or lung: Secondary | ICD-10-CM

## 2014-03-09 LAB — CBC WITH DIFFERENTIAL/PLATELET
BASO%: 1 % (ref 0.0–2.0)
Basophils Absolute: 0.1 10*3/uL (ref 0.0–0.1)
EOS ABS: 0.2 10*3/uL (ref 0.0–0.5)
EOS%: 2.8 % (ref 0.0–7.0)
HCT: 40 % (ref 34.8–46.6)
HGB: 13 g/dL (ref 11.6–15.9)
LYMPH%: 12 % — ABNORMAL LOW (ref 14.0–49.7)
MCH: 28.7 pg (ref 25.1–34.0)
MCHC: 32.6 g/dL (ref 31.5–36.0)
MCV: 87.9 fL (ref 79.5–101.0)
MONO#: 0.9 10*3/uL (ref 0.1–0.9)
MONO%: 10.5 % (ref 0.0–14.0)
NEUT%: 73.7 % (ref 38.4–76.8)
NEUTROS ABS: 6.2 10*3/uL (ref 1.5–6.5)
Platelets: 265 10*3/uL (ref 145–400)
RBC: 4.55 10*6/uL (ref 3.70–5.45)
RDW: 13.8 % (ref 11.2–14.5)
WBC: 8.4 10*3/uL (ref 3.9–10.3)
lymph#: 1 10*3/uL (ref 0.9–3.3)

## 2014-03-09 LAB — COMPREHENSIVE METABOLIC PANEL (CC13)
ALBUMIN: 3.5 g/dL (ref 3.5–5.0)
ALK PHOS: 99 U/L (ref 40–150)
ALT: 11 U/L (ref 0–55)
AST: 11 U/L (ref 5–34)
Anion Gap: 11 mEq/L (ref 3–11)
BILIRUBIN TOTAL: 0.36 mg/dL (ref 0.20–1.20)
BUN: 11.6 mg/dL (ref 7.0–26.0)
CO2: 24 meq/L (ref 22–29)
Calcium: 10.2 mg/dL (ref 8.4–10.4)
Chloride: 102 mEq/L (ref 98–109)
Creatinine: 0.8 mg/dL (ref 0.6–1.1)
Glucose: 106 mg/dl (ref 70–140)
Potassium: 4 mEq/L (ref 3.5–5.1)
SODIUM: 137 meq/L (ref 136–145)
TOTAL PROTEIN: 7.8 g/dL (ref 6.4–8.3)

## 2014-03-09 LAB — MAGNESIUM (CC13): Magnesium: 2.3 mg/dl (ref 1.5–2.5)

## 2014-03-09 MED ORDER — DEXAMETHASONE SODIUM PHOSPHATE 20 MG/5ML IJ SOLN
INTRAMUSCULAR | Status: AC
  Filled 2014-03-09: qty 5

## 2014-03-09 MED ORDER — LORAZEPAM 2 MG/ML IJ SOLN: 0.5000 mg | Freq: Once | INTRAMUSCULAR | Status: DC

## 2014-03-09 MED ORDER — OXYCODONE HCL 5 MG PO TABS
ORAL_TABLET | ORAL | Status: DC
Start: 2014-03-09 — End: 2014-04-27

## 2014-03-09 MED ORDER — SODIUM CHLORIDE 0.9 % IV SOLN
40.0000 mg/m2 | Freq: Once | INTRAVENOUS | Status: AC
  Administered 2014-03-09: 64 mg via INTRAVENOUS
  Filled 2014-03-09: qty 64

## 2014-03-09 MED ORDER — SODIUM CHLORIDE 0.9 % IV SOLN
150.0000 mg | Freq: Once | INTRAVENOUS | Status: AC
  Administered 2014-03-09: 150 mg via INTRAVENOUS
  Filled 2014-03-09: qty 5

## 2014-03-09 MED ORDER — DEXTROSE-NACL 5-0.45 % IV SOLN
Freq: Once | INTRAVENOUS | Status: AC
  Administered 2014-03-09: 09:00:00 via INTRAVENOUS
  Filled 2014-03-09: qty 10

## 2014-03-09 MED ORDER — ONDANSETRON 16 MG/50ML IVPB (CHCC)
16.0000 mg | Freq: Once | INTRAVENOUS | Status: AC
  Administered 2014-03-09: 16 mg via INTRAVENOUS

## 2014-03-09 MED ORDER — ONDANSETRON 16 MG/50ML IVPB (CHCC)
INTRAVENOUS | Status: AC
  Filled 2014-03-09: qty 16

## 2014-03-09 MED ORDER — SODIUM CHLORIDE 0.9 % IV SOLN
Freq: Once | INTRAVENOUS | Status: AC
  Administered 2014-03-09: 09:00:00 via INTRAVENOUS

## 2014-03-09 MED ORDER — DEXAMETHASONE SODIUM PHOSPHATE 20 MG/5ML IJ SOLN
12.0000 mg | Freq: Once | INTRAMUSCULAR | Status: AC
  Administered 2014-03-09: 12 mg via INTRAVENOUS

## 2014-03-09 NOTE — Patient Instructions (Signed)
[facility] Discharge Instructions for Patients Receiving Chemotherapy  Today you received the following chemotherapy agents: Cisplatin  To help prevent nausea and vomiting after your treatment, we encourage you to take your nausea medication as prescribed by your physician: Zofran 8 mg every 8 hrs as needed; or Ativan 1/2-1 tab every 6 hrs as needed.  If you develop nausea and vomiting that is not controlled by your nausea medication, call the clinic.   BELOW ARE SYMPTOMS THAT SHOULD BE REPORTED IMMEDIATELY:  *FEVER GREATER THAN 100.5 F  *CHILLS WITH OR WITHOUT FEVER  NAUSEA AND VOMITING THAT IS NOT CONTROLLED WITH YOUR NAUSEA MEDICATION  *UNUSUAL SHORTNESS OF BREATH  *UNUSUAL BRUISING OR BLEEDING  TENDERNESS IN MOUTH AND THROAT WITH OR WITHOUT PRESENCE OF ULCERS  *URINARY PROBLEMS  *BOWEL PROBLEMS  UNUSUAL RASH Items with * indicate a potential emergency and should be followed up as soon as possible.  Feel free to call the clinic you have any questions or concerns. The clinic phone number is [phone].  Cisplatin injection What is this medicine? CISPLATIN (SIS pla tin) is a chemotherapy drug. It targets fast dividing cells, like cancer cells, and causes these cells to die. This medicine is used to treat many types of cancer like bladder, ovarian, and testicular cancers. This medicine may be used for other purposes; ask your health care provider or pharmacist if you have questions. COMMON BRAND NAME(S): Platinol, Platinol -AQ What should I tell my health care provider before I take this medicine? They need to know if you have any of these conditions: -blood disorders -hearing problems -kidney disease -recent or ongoing radiation therapy -an unusual or allergic reaction to cisplatin, carboplatin, other chemotherapy, other medicines, foods, dyes, or preservatives -pregnant or trying to get pregnant -breast-feeding How should I use this medicine? This  drug is given as an infusion into a vein. It is administered in a hospital or clinic by a specially trained health care professional. Talk to your pediatrician regarding the use of this medicine in children. Special care may be needed. Overdosage: If you think you have taken too much of this medicine contact a poison control center or emergency room at once. NOTE: This medicine is only for you. Do not share this medicine with others. What if I miss a dose? It is important not to miss a dose. Call your doctor or health care professional if you are unable to keep an appointment. What may interact with this medicine? -dofetilide -foscarnet -medicines for seizures -medicines to increase blood counts like filgrastim, pegfilgrastim, sargramostim -probenecid -pyridoxine used with altretamine -rituximab -some antibiotics like amikacin, gentamicin, neomycin, polymyxin B, streptomycin, tobramycin -sulfinpyrazone -vaccines -zalcitabine Talk to your doctor or health care professional before taking any of these medicines: -acetaminophen -aspirin -ibuprofen -ketoprofen -naproxen This list may not describe all possible interactions. Give your health care provider a list of all the medicines, herbs, non-prescription drugs, or dietary supplements you use. Also tell them if you smoke, drink alcohol, or use illegal drugs. Some items may interact with your medicine. What should I watch for while using this medicine? Your condition will be monitored carefully while you are receiving this medicine. You will need important blood work done while you are taking this medicine. This drug may make you feel generally unwell. This is not uncommon, as chemotherapy can affect healthy cells as well as cancer cells. Report any side effects. Continue your course of treatment even though you feel ill unless your doctor tells you  to stop. In some cases, you may be given additional medicines to help with side effects. Follow  all directions for their use. Call your doctor or health care professional for advice if you get a fever, chills or sore throat, or other symptoms of a cold or flu. Do not treat yourself. This drug decreases your body's ability to fight infections. Try to avoid being around people who are sick. This medicine may increase your risk to bruise or bleed. Call your doctor or health care professional if you notice any unusual bleeding. Be careful brushing and flossing your teeth or using a toothpick because you may get an infection or bleed more easily. If you have any dental work done, tell your dentist you are receiving this medicine. Avoid taking products that contain aspirin, acetaminophen, ibuprofen, naproxen, or ketoprofen unless instructed by your doctor. These medicines may hide a fever. Do not become pregnant while taking this medicine. Women should inform their doctor if they wish to become pregnant or think they might be pregnant. There is a potential for serious side effects to an unborn child. Talk to your health care professional or pharmacist for more information. Do not breast-feed an infant while taking this medicine. Drink fluids as directed while you are taking this medicine. This will help protect your kidneys. Call your doctor or health care professional if you get diarrhea. Do not treat yourself. What side effects may I notice from receiving this medicine? Side effects that you should report to your doctor or health care professional as soon as possible: -allergic reactions like skin rash, itching or hives, swelling of the face, lips, or tongue -signs of infection - fever or chills, cough, sore throat, pain or difficulty passing urine -signs of decreased platelets or bleeding - bruising, pinpoint red spots on the skin, black, tarry stools, nosebleeds -signs of decreased red blood cells - unusually weak or tired, fainting spells, lightheadedness -breathing problems -changes in  hearing -gout pain -low blood counts - This drug may decrease the number of white blood cells, red blood cells and platelets. You may be at increased risk for infections and bleeding. -nausea and vomiting -pain, swelling, redness or irritation at the injection site -pain, tingling, numbness in the hands or feet -problems with balance, movement -trouble passing urine or change in the amount of urine Side effects that usually do not require medical attention (report to your doctor or health care professional if they continue or are bothersome): -changes in vision -loss of appetite -metallic taste in the mouth or changes in taste This list may not describe all possible side effects. Call your doctor for medical advice about side effects. You may report side effects to FDA at 1-800-FDA-1088. Where should I keep my medicine? This drug is given in a hospital or clinic and will not be stored at home. NOTE: This sheet is a summary. It may not cover all possible information. If you have questions about this medicine, talk to your doctor, pharmacist, or health care provider.  2015, [employer]. (2007-08-06 14:40:54)

## 2014-03-09 NOTE — Telephone Encounter (Signed)
, °

## 2014-03-09 NOTE — Progress Notes (Signed)
IMRT Device Note    ICD-9-CM ICD-10-CM  1. Primary vulvar cancer 184.4 C51.9    12.7 delivered field widths represent one set of IMRT treatment devices. The code is [phone].  -----------------------------------  [name], MD

## 2014-03-09 NOTE — Progress Notes (Signed)
   Weekly Management Note:  Outpatient    ICD-9-CM ICD-10-CM  1. Primary vulvar cancer 184.4 C51.9    Current Dose:  2 Gy  Projected Dose: 60 Gy   Narrative:  The patient presents for routine under treatment assessment.  CBCT/MVCT images/Port film x-rays were reviewed.  The chart was checked. Feeling well, no new complaints. First cycle CDDP today.  Physical Findings:  Vitals - 1 value per visit 80/07/4915  SYSTOLIC 915  DIASTOLIC 60  Pulse 81  Temperature 98.1  Respirations 18  Weight (lb) 129.06  Height   BMI 24.4  VISIT REPORT   Standing, NAD  CBC    Component Value Date/Time   WBC 8.4 03/09/2014 0759   WBC 7.4 02/23/2014 0730   RBC 4.55 03/09/2014 0759   RBC 4.33 02/23/2014 0730   HGB 13.0 03/09/2014 0759   HGB 12.5 02/23/2014 0730   HCT 40.0 03/09/2014 0759   HCT 38.2 02/23/2014 0730   PLT 265 03/09/2014 0759   PLT 203 02/23/2014 0730   MCV 87.9 03/09/2014 0759   MCV 88.2 02/23/2014 0730   MCH 28.7 03/09/2014 0759   MCH 28.9 02/23/2014 0730   MCHC 32.6 03/09/2014 0759   MCHC 32.7 02/23/2014 0730   RDW 13.8 03/09/2014 0759   RDW 14.1 02/23/2014 0730   LYMPHSABS 1.0 03/09/2014 0759   LYMPHSABS 1.1 02/23/2014 0730   MONOABS 0.9 03/09/2014 0759   MONOABS 0.9 02/23/2014 0730   EOSABS 0.2 03/09/2014 0759   EOSABS 0.2 02/23/2014 0730   BASOSABS 0.1 03/09/2014 0759   BASOSABS 0.0 02/23/2014 0730     CMP     Component Value Date/Time   NA 137 03/09/2014 0801   NA 140 02/06/2014 1110   K 4.0 03/09/2014 0801   K 4.9 02/06/2014 1110   CL 101 02/06/2014 1110   CO2 24 03/09/2014 0801   CO2 26 02/06/2014 1110   GLUCOSE 106 03/09/2014 0801   GLUCOSE 105* 02/06/2014 1110   BUN 11.6 03/09/2014 0801   BUN 8 02/06/2014 1110   CREATININE 0.8 03/09/2014 0801   CREATININE 0.72 02/06/2014 1110   CALCIUM 10.2 03/09/2014 0801   CALCIUM 10.3 02/06/2014 1110   PROT 7.8 03/09/2014 0801   PROT 8.2 02/06/2014 1110   ALBUMIN 3.5 03/09/2014 0801   ALBUMIN 4.3 02/06/2014 1110     AST 11 03/09/2014 0801   AST 15 02/06/2014 1110   ALT 11 03/09/2014 0801   ALT 11 02/06/2014 1110   ALKPHOS 99 03/09/2014 0801   ALKPHOS 97 02/06/2014 1110   BILITOT 0.36 03/09/2014 0801   BILITOT 0.3 02/06/2014 1110   GFRNONAA >90 02/06/2014 1110   GFRAA >90 02/06/2014 1110     Impression:  The patient is tolerating radiotherapy.   Plan:  Continue radiotherapy as planned.  -----------------------------------  [name], MD

## 2014-03-09 NOTE — Progress Notes (Signed)
She rates her pain as a 2 on a scale of 0-10, dull pain in the vulva area. Reports using a cushion for comfort when sitting. Pt complains of, Loss of Sleep.  Reports night sweats, denies vaginal discharge. Reports the most distal vulvar tumor has some mild bleeding.  Pt states they urinate 1 - 2 times per night. Pt reports, a bowel movement every 2-3 day. The patient eats a regular, healthy diet.

## 2014-03-09 NOTE — Progress Notes (Signed)
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   Patient admits to more pain at vulva especially at night, to point that she has been unable to sleep. Still occasional minimal bleeding from ulcerated area at vulva. She is doing sitz baths regularly and gets some slight relief with topical lidocaine gel to vulvar area. Left chest soreness x 1 day after last SBRT to lung, no significant cough, no hemoptysis or sputum, no increased SOB. She agrees with prn pain medication now; she has not picked up antiemetics, those scripts at her pharmacy and she agrees that she will get these today. She has done almost all of oral prehydration this AM.   She does not have PAC. She had flu vaccine  ONCOLOGIC HISTORY Patient had no regular medical care at least since moving to [place] several years ago. She had gradually enlarging mass at right vulva for ~ 8 months, uncomfortable with direct pressure and ulcerated with occasional bleeding. She was seen in ED on 02-02-14 with right inguinal adenopathy and right vulvar masses, then seen by Dr Elonda Husky on 02-03-14, clinically with vulvar carcinoma. She was then seen in consultation by Dr Skeet Latch on 02-05-14, with biopsy of both vulvar mass and satellite lesions on inner thigh and mons. Her exam found 7 cm right vulvar mass with 4 cm extension to distal vagina, extending anteriorly to urethral meatus and in ischiorectal area, with satellite lesions inner thigh and right mons. Pathology [phone]) showed invasive squamous cell carcinoma arising in background of high grade squamous intraepithelial lesion from vulva biopsy and invasive moderately differentiated squamous cell carcinoma  from inner thigh lesion. CXR 02-06-14 showed left lower lobe lung mass 3.6 x 3.6 x 3.6 cm, with no adenopathy and no bony lesions, emphysematous changes. CT chest 02-09-14 showed the lower lobe mass 3.7 x 3.5 x 3.3 cm as well as 3 mm RLL nodule and faint 4 mm LUL nodule, no mediastinal or hilar nodes, no axillary or supraclavicular nodes, and upper abdomen not remarkable. PET 02-18-14 measured LLL lung mass 4.6 x 3.9 cm, hypermetabolic, and noted multiple subcentimeter nodules scattered thruout lungs bilaterally; the right vulvar mass was also hypermetabolic, extending up into right side of lower vagina, extensive R>L and right external iliac adenopathy, no uptake in skeleton. She had CT biopsy of LLL pulmonary mass on 02-24-14, that pathology not yet available in EMR, tho I understand pathologist favors primary lung squamous cell carcinoma. After initial consideration of bilateral inguinal lymphadenectomy, plan is now to begin treatment with radiation. She had first of 3 stereotactic body radiation treatments to the LLL pulmonary mass today, with SBRT also planned 10-20 and 03-05-14. She will begin IMRT to vulvar involvement on 03-09-14, planned thru 04-24-14, with weekly CDDP as radiation sensitizer. First CDDP 03-09-14  Review of systems as above, also: No fever. Is eating with husband's encouragement, tho not much appetite.  Bowels are moving. No LE swelling. No other bleeding.  Remainder of 10 point Review of Systems negative.  Objective:  Vital signs in last 24 hours:  BP 119/60  Pulse 81  Temp(Src) 98.1 F (36.7 C)  Resp 18  Wt 129 lb 1 oz (58.542 kg) Weight is down 1 lb. Alert, oriented and appropriate, very  pleasant and reluctant to describe problems. Ambulatory without assistance. Too uncomfortable to sit, exam done standiing No alopecia  HEENT:PERRL, sclerae not icteric. Oral mucosa moist without lesions, posterior pharynx clear.  Neck supple. No JVD.  Lymphatics:no  cervical,suraclavicular adenopathy Resp: diminished BS thruout, otherwise clear to auscultation bilaterally. No use of accessory muscles. Respirations not labored RA Cardio: regular rate and rhythm. No gallop. GI: soft, nontender, not distended, no mass or organomegaly. Normally active bowel sounds. Vulva with large fungating mass, 2 areas of ulceration, no drainage or bleeding.  Musculoskeletal/ Extremities: without pitting edema, cords, tenderness Neuro: no peripheral neuropathy. Otherwise nonfocal Skin without rash, ecchymosis, petechiae   Lab Results:  Results for orders placed in visit on 03/09/14  CBC WITH DIFFERENTIAL      Result Value Ref Range   WBC 8.4  3.9 - 10.3 10e3/uL   NEUT# 6.2  1.5 - 6.5 10e3/uL   HGB 13.0  11.6 - 15.9 g/dL   HCT 40.0  34.8 - 46.6 %   Platelets 265  145 - 400 10e3/uL   MCV 87.9  79.5 - 101.0 fL   MCH 28.7  25.1 - 34.0 pg   MCHC 32.6  31.5 - 36.0 g/dL   RBC 4.55  3.70 - 5.45 10e6/uL   RDW 13.8  11.2 - 14.5 %   lymph# 1.0  0.9 - 3.3 10e3/uL   MONO# 0.9  0.1 - 0.9 10e3/uL   Eosinophils Absolute 0.2  0.0 - 0.5 10e3/uL   Basophils Absolute 0.1  0.0 - 0.1 10e3/uL   NEUT% 73.7  38.4 - 76.8 %   LYMPH% 12.0 (*) 14.0 - 49.7 %   MONO% 10.5  0.0 - 14.0 %   EOS% 2.8  0.0 - 7.0 %   BASO% 1.0  0.0 - 2.0 %  COMPREHENSIVE METABOLIC PANEL (JQ73)      Result Value Ref Range   Sodium 137  136 - 145 mEq/L   Potassium 4.0  3.5 - 5.1 mEq/L   Chloride 102  98 - 109 mEq/L   CO2 24  22 - 29 mEq/L   Glucose 106  70 - 140 mg/dl   BUN 11.6  7.0 - 26.0 mg/dL   Creatinine 0.8  0.6 - 1.1 mg/dL   Total Bilirubin 0.36  0.20 - 1.20 mg/dL   Alkaline Phosphatase 99  40 - 150 U/L   AST 11  5 - 34 U/L   ALT 11  0 - 55 U/L   Total Protein 7.8  6.4 - 8.3 g/dL   Albumin 3.5  3.5 - 5.0 g/dL   Calcium 10.2  8.4 - 10.4 mg/dL   Anion Gap 11  3 - 11 mEq/L  MAGNESIUM (CC13)      Result Value Ref Range   Magnesium 2.3  1.5 - 2.5 mg/dl     Studies/Results:  No results  found.  Medications: I have reviewed the patient's current medications. Prescription given for oxyIR 5 mg one every 4-6 hrs prn (may need to increase dose). RN confirmed that no preauth needed for the zofran and ativan at her pharmacy.  DISCUSSION: I spoke with infusion RN, as patient will need to be positioned lying in recliner or in bed. Message to Michelle/Helen/Maggie for possible bed for subsequent treatments.  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 She is symptomatic from extent of perineal disease.  IMRT with weekly CDDP beginning today. PRN oxycodone written and we will try to position her as comfortably as possible during long chemo. I will see her on 03-13-14 coordinating with RT.  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 Multiple bilateral subcentimeter pulmonary nodules concerning for metastatic disease. SBRT to the LLL mass. Follow pulmonary symptoms closely as other areas are treated.  3.long past tobacco  4.flu vaccine given  5.history migraine HAs. Discussed possible HA with zofran.  6.allergy PCN  7.BTL 8.has been given information for Advance Directives.   All questions answered. Patient knows that she can call if any questions or concerns before visit on 03-13-14. Chemo orders confirmed.   Ramesses Crampton P, MD   03/09/2014, 9:11 AM

## 2014-03-09 NOTE — Progress Notes (Signed)
Patient completed 1st cycle of cisplatin without difficulty.

## 2014-03-09 NOTE — Progress Notes (Addendum)
Pt here for patient education.  Pt given Radiation and You booklet with areas of pertinence flagged and highlighted.  Reviewed urinary, bowel, hair loss, skin changes, nausea, fatigue, and sexual changes.  Instructed to use unscented fragrance free baby wipes, water sprayer and moistened toilet paper for pericare after voiding or bowel movements.  Pt reports she already has received a sitz bath and feels comfortable with the use of it.  Pt able to teach back how to use the sitz bath, not to use a lotion or cream and to cleanse with wipes.

## 2014-03-09 NOTE — Addendum Note (Signed)
Encounter addended by: Jenene Slicker, RN on: 03/09/2014  5:30 PM<BR>     Documentation filed: Notes Section

## 2014-03-10 ENCOUNTER — Encounter: Payer: Self-pay | Admitting: Radiation Oncology

## 2014-03-10 ENCOUNTER — Telehealth: Payer: Self-pay | Admitting: *Deleted

## 2014-03-10 ENCOUNTER — Ambulatory Visit
Admission: RE | Admit: 2014-03-10 | Discharge: 2014-03-10 | Disposition: A | Discharge status: Home / Self Care | Payer: BC Managed Care – PPO | Source: Ambulatory Visit | Attending: Radiation Oncology | Admitting: Radiation Oncology

## 2014-03-10 ENCOUNTER — Other Ambulatory Visit: Payer: Self-pay | Admitting: Oncology

## 2014-03-10 DIAGNOSIS — C3432 Malignant neoplasm of lower lobe, left bronchus or lung: Secondary | ICD-10-CM

## 2014-03-10 DIAGNOSIS — G893 Neoplasm related pain (acute) (chronic): Secondary | ICD-10-CM | POA: Insufficient documentation

## 2014-03-10 DIAGNOSIS — C519 Malignant neoplasm of vulva, unspecified: Secondary | ICD-10-CM

## 2014-03-10 DIAGNOSIS — Z510 Encounter for antineoplastic radiation therapy: Secondary | ICD-10-CM | POA: Diagnosis not present

## 2014-03-10 NOTE — Progress Notes (Signed)
10.27.15:  FMLA paperwork rec'd by patient.  Forwarded to RN for processing.

## 2014-03-10 NOTE — Telephone Encounter (Signed)
No complaints or questions. Confirmed she has her antiemetics and is able to hydrate per MD instructions.

## 2014-03-11 ENCOUNTER — Ambulatory Visit
Admission: RE | Admit: 2014-03-11 | Discharge: 2014-03-11 | Disposition: A | Discharge status: Home / Self Care | Payer: BC Managed Care – PPO | Source: Ambulatory Visit | Attending: Radiation Oncology | Admitting: Radiation Oncology

## 2014-03-11 DIAGNOSIS — Z510 Encounter for antineoplastic radiation therapy: Secondary | ICD-10-CM | POA: Diagnosis not present

## 2014-03-12 ENCOUNTER — Ambulatory Visit
Admission: RE | Admit: 2014-03-12 | Discharge: 2014-03-12 | Disposition: A | Discharge status: Home / Self Care | Payer: BC Managed Care – PPO | Source: Ambulatory Visit | Attending: Radiation Oncology | Admitting: Radiation Oncology

## 2014-03-12 DIAGNOSIS — Z510 Encounter for antineoplastic radiation therapy: Secondary | ICD-10-CM | POA: Diagnosis not present

## 2014-03-13 ENCOUNTER — Other Ambulatory Visit: Payer: Self-pay | Admitting: Oncology

## 2014-03-13 ENCOUNTER — Encounter: Payer: Self-pay | Admitting: Oncology

## 2014-03-13 ENCOUNTER — Ambulatory Visit (HOSPITAL_BASED_OUTPATIENT_CLINIC_OR_DEPARTMENT_OTHER): Payer: BC Managed Care – PPO | Admitting: Oncology

## 2014-03-13 ENCOUNTER — Ambulatory Visit
Admission: RE | Admit: 2014-03-13 | Discharge: 2014-03-13 | Disposition: A | Discharge status: Home / Self Care | Payer: BC Managed Care – PPO | Source: Ambulatory Visit | Attending: Radiation Oncology | Admitting: Radiation Oncology

## 2014-03-13 VITALS — BP 80/49 | HR 91 | Temp 98.3°F | Resp 16 | Wt 130.1 lb

## 2014-03-13 DIAGNOSIS — C3432 Malignant neoplasm of lower lobe, left bronchus or lung: Secondary | ICD-10-CM

## 2014-03-13 DIAGNOSIS — C519 Malignant neoplasm of vulva, unspecified: Secondary | ICD-10-CM

## 2014-03-13 DIAGNOSIS — C774 Secondary and unspecified malignant neoplasm of inguinal and lower limb lymph nodes: Secondary | ICD-10-CM

## 2014-03-13 DIAGNOSIS — C792 Secondary malignant neoplasm of skin: Secondary | ICD-10-CM

## 2014-03-13 DIAGNOSIS — Z510 Encounter for antineoplastic radiation therapy: Secondary | ICD-10-CM | POA: Diagnosis not present

## 2014-03-13 MED ORDER — TRAMADOL HCL 50 MG PO TABS: 50.0000 mg | ORAL_TABLET | Freq: Three times a day (TID) | ORAL | Status: DC | PRN

## 2014-03-13 NOTE — Progress Notes (Signed)
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 She had first CDDP and first IMRT on 03-09-14, tolerated well.   She is still very uncomfortable at perineal area, took oxyIR 5 mg x 1 after RT on 03-11-14 which did help pain x 3.5 hrs tho she did not like feeling "woozy, drowsy". She is sleeping poorly due to discomfort, ~ 3 hrs only last pm. We have discussed fact that she is exhausted and probably will go to sleep as soon as she is comfortable after taking pain medication; she will try 1/2 - 1 oxyIR and/or new tramadol prescription. She denies SOB, cough, chest pain.  She does not have PAC.  She had flu vaccine  ONCOLOGIC HISTORY Patient had no regular medical care at least since moving to [place] several years ago. She had gradually enlarging mass at right vulva for ~ 8 months, uncomfortable with direct pressure and ulcerated with occasional bleeding. She was seen in ED on 02-02-14 with right inguinal adenopathy and right vulvar masses, then seen by Dr Elonda Husky on 02-03-14, clinically with vulvar carcinoma. She was then seen in consultation by Dr Skeet Latch on 02-05-14, with biopsy of both vulvar mass and satellite lesions on inner thigh and mons. Her exam found 7 cm right vulvar mass with 4 cm extension to distal vagina, extending anteriorly to urethral meatus and in ischiorectal area, with satellite lesions inner thigh and right mons. Pathology [phone]) showed invasive squamous cell carcinoma arising in background of high grade squamous intraepithelial lesion from vulva biopsy and invasive moderately differentiated squamous cell carcinoma from inner thigh lesion. CXR 02-06-14 showed left lower lobe lung mass 3.6 x 3.6 x 3.6 cm,  with no adenopathy and no bony lesions, emphysematous changes. CT chest 02-09-14 showed the lower lobe mass 3.7 x 3.5 x 3.3 cm as well as 3 mm RLL nodule and faint 4 mm LUL nodule, no mediastinal or hilar nodes, no axillary or supraclavicular nodes, and upper abdomen not remarkable. PET 02-18-14 measured LLL lung mass 4.6 x 3.9 cm, hypermetabolic, and noted multiple subcentimeter nodules scattered thruout lungs bilaterally; the right vulvar mass was also hypermetabolic, extending up into right side of lower vagina, extensive R>L and right external iliac adenopathy, no uptake in skeleton. She had CT biopsy of LLL pulmonary mass on 02-24-14, that pathology not yet available in EMR, tho I understand pathologist favors primary lung squamous cell carcinoma. After initial consideration of bilateral inguinal lymphadenectomy, plan is now to begin treatment with radiation. She had first of 3 stereotactic body radiation treatments to the LLL pulmonary mass today, with SBRT also planned 10-20 and 03-05-14. She began IMRT to vulvar involvement on 03-09-14, planned thru 04-24-14, with weekly CDDP as radiation sensitizer, first CDDP 03-09-14  Review of systems as above, also: No fever. Appetite poor but is eating, no nausea or vomiting. Bowels are moving, no diarrhea. No other bleeding. She did not drink fluids as usual prior to this early appointment. She denies orthostatic symptoms. Remainder of 10 point Review of Systems negative.  Objective:  Vital signs in last 24 hours:  BP 80/49  Pulse 91  Temp(Src) 98.3 F (36.8 C) (Oral)  Resp 16  Wt 130 lb 1.6 oz (59.013 kg) Previous BP 119/60. Weight is  up 1 lb. Looks mildly uncomfortable but not acutely ill. Very pleasant and cooperative. Alert, oriented and appropriate. Ambulatory without assistance. Does not tolerate sitting. No alopecia  HEENT:PERRL, sclerae not icteric. Oral mucosa not dry,  without lesions, posterior pharynx clear.  Neck supple. No JVD.   Lymphatics:no cervical,suraclavicular adenopathy. Right inguinal node no larger, firm, not markedly tender, no skin breakdown.  Resp: diminished BS thruout without wheezes or rales,  No dullness to percussion bilaterally Cardio: regular rate and rhythm. No gallop. GI: soft, nontender, not distended, no mass or organomegaly. Some bowel sounds. Musculoskeletal/ Extremities: without pitting edema, cords, tenderness Neuro: no peripheral neuropathy. Otherwise nonfocal. PSYCH appropriate mood and affect. Skin without rash, ecchymosis, petechiae   Lab Results: Labs ordered for day of Rx, not repeated today Results for orders placed in visit on 03/09/14  CBC WITH DIFFERENTIAL      Result Value Ref Range   WBC 8.4  3.9 - 10.3 10e3/uL   NEUT# 6.2  1.5 - 6.5 10e3/uL   HGB 13.0  11.6 - 15.9 g/dL   HCT 40.0  34.8 - 46.6 %   Platelets 265  145 - 400 10e3/uL   MCV 87.9  79.5 - 101.0 fL   MCH 28.7  25.1 - 34.0 pg   MCHC 32.6  31.5 - 36.0 g/dL   RBC 4.55  3.70 - 5.45 10e6/uL   RDW 13.8  11.2 - 14.5 %   lymph# 1.0  0.9 - 3.3 10e3/uL   MONO# 0.9  0.1 - 0.9 10e3/uL   Eosinophils Absolute 0.2  0.0 - 0.5 10e3/uL   Basophils Absolute 0.1  0.0 - 0.1 10e3/uL   NEUT% 73.7  38.4 - 76.8 %   LYMPH% 12.0 (*) 14.0 - 49.7 %   MONO% 10.5  0.0 - 14.0 %   EOS% 2.8  0.0 - 7.0 %   BASO% 1.0  0.0 - 2.0 %  COMPREHENSIVE METABOLIC PANEL (VE93)      Result Value Ref Range   Sodium 137  136 - 145 mEq/L   Potassium 4.0  3.5 - 5.1 mEq/L   Chloride 102  98 - 109 mEq/L   CO2 24  22 - 29 mEq/L   Glucose 106  70 - 140 mg/dl   BUN 11.6  7.0 - 26.0 mg/dL   Creatinine 0.8  0.6 - 1.1 mg/dL   Total Bilirubin 0.36  0.20 - 1.20 mg/dL   Alkaline Phosphatase 99  40 - 150 U/L   AST 11  5 - 34 U/L   ALT 11  0 - 55 U/L   Total Protein 7.8  6.4 - 8.3 g/dL   Albumin 3.5  3.5 - 5.0 g/dL   Calcium 10.2  8.4 - 10.4 mg/dL   Anion Gap 11  3 - 11 mEq/L  MAGNESIUM (CC13)      Result Value Ref Range   Magnesium 2.3  1.5 - 2.5 mg/dl      Studies/Results:  No results found.  Medications: I have reviewed the patient's current medications. She is on no antihypertensives. Suggest using OxyIR at hs, trying 1/2 OxyIR q 4 hr prn, also have given prescription for Tramadol 50 mg q 8 hr prn in case that is better tolerated than the oxycodone. She is using topical viscous lidocaine.  DISCUSSION: she needs to push po fluids including gatorade. Pain medication as above. She understands oral prehydration for CDDP in addition.  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 She is symptomatic from extent of perineal disease. IMRT with weekly CDDP began 03-09-14. Pain medication as above. I will see her next week. 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 Multiple bilateral subcentimeter pulmonary nodules concerning for metastatic disease. SBRT to the LLL mass. Follow pulmonary symptoms closely as other areas are treated.  3.long past tobacco  4. Lower BP today, does not seem symptomatic. Push po fluids. 5.history migraine HAs. No HA with zofran.  6.allergy PCN  7.BTL  8.has been given information for Advance Directives 9.flu vaccine given     Patient understands that she can call this office at any time prior to scheduled appointment if needed. Chemo orders confirmed.   LIVESAY,LENNIS P, MD   03/13/2014, 8:26 AM

## 2014-03-14 ENCOUNTER — Other Ambulatory Visit: Payer: Self-pay | Admitting: Oncology

## 2014-03-14 NOTE — Progress Notes (Signed)
Medical Oncology  Following visit, I found that she has NOT been scheduled to see MD on 11-4 or 11-16 as requested at my visit 02-28-14. Message sent as urgent to schedulers to address this.  Godfrey Pick, MD

## 2014-03-16 ENCOUNTER — Ambulatory Visit (HOSPITAL_BASED_OUTPATIENT_CLINIC_OR_DEPARTMENT_OTHER): Payer: BC Managed Care – PPO | Admitting: Oncology

## 2014-03-16 ENCOUNTER — Other Ambulatory Visit: Payer: Self-pay | Admitting: Oncology

## 2014-03-16 ENCOUNTER — Inpatient Hospital Stay
Admission: RE | Admit: 2014-03-16 | Discharge: 2014-03-16 | Disposition: A | Discharge status: Home / Self Care | Payer: BC Managed Care – PPO | Source: Ambulatory Visit | Attending: Radiation Oncology | Admitting: Radiation Oncology

## 2014-03-16 ENCOUNTER — Other Ambulatory Visit (HOSPITAL_BASED_OUTPATIENT_CLINIC_OR_DEPARTMENT_OTHER): Payer: BC Managed Care – PPO

## 2014-03-16 ENCOUNTER — Encounter: Payer: Self-pay | Admitting: Oncology

## 2014-03-16 ENCOUNTER — Ambulatory Visit (HOSPITAL_BASED_OUTPATIENT_CLINIC_OR_DEPARTMENT_OTHER): Payer: BC Managed Care – PPO

## 2014-03-16 ENCOUNTER — Ambulatory Visit: Payer: BC Managed Care – PPO | Admitting: Nutrition

## 2014-03-16 ENCOUNTER — Ambulatory Visit
Admission: RE | Admit: 2014-03-16 | Discharge: 2014-03-16 | Disposition: A | Discharge status: Home / Self Care | Payer: BC Managed Care – PPO | Source: Ambulatory Visit | Attending: Radiation Oncology | Admitting: Radiation Oncology

## 2014-03-16 VITALS — BP 110/59 | HR 72 | Temp 98.6°F | Resp 12 | Wt 128.2 lb

## 2014-03-16 DIAGNOSIS — C519 Malignant neoplasm of vulva, unspecified: Secondary | ICD-10-CM

## 2014-03-16 DIAGNOSIS — Z510 Encounter for antineoplastic radiation therapy: Secondary | ICD-10-CM | POA: Diagnosis not present

## 2014-03-16 DIAGNOSIS — C774 Secondary and unspecified malignant neoplasm of inguinal and lower limb lymph nodes: Secondary | ICD-10-CM

## 2014-03-16 DIAGNOSIS — C3432 Malignant neoplasm of lower lobe, left bronchus or lung: Secondary | ICD-10-CM

## 2014-03-16 DIAGNOSIS — Z5111 Encounter for antineoplastic chemotherapy: Secondary | ICD-10-CM

## 2014-03-16 DIAGNOSIS — C792 Secondary malignant neoplasm of skin: Secondary | ICD-10-CM

## 2014-03-16 LAB — CBC WITH DIFFERENTIAL/PLATELET
BASO%: 0.8 % (ref 0.0–2.0)
Basophils Absolute: 0.1 10*3/uL (ref 0.0–0.1)
EOS%: 1.9 % (ref 0.0–7.0)
Eosinophils Absolute: 0.1 10*3/uL (ref 0.0–0.5)
HCT: 35.8 % (ref 34.8–46.6)
HEMOGLOBIN: 11.6 g/dL (ref 11.6–15.9)
LYMPH%: 6.2 % — ABNORMAL LOW (ref 14.0–49.7)
MCH: 28.1 pg (ref 25.1–34.0)
MCHC: 32.4 g/dL (ref 31.5–36.0)
MCV: 86.9 fL (ref 79.5–101.0)
MONO#: 0.8 10*3/uL (ref 0.1–0.9)
MONO%: 10.8 % (ref 0.0–14.0)
NEUT#: 6.1 10*3/uL (ref 1.5–6.5)
NEUT%: 80.3 % — ABNORMAL HIGH (ref 38.4–76.8)
PLATELETS: 214 10*3/uL (ref 145–400)
RBC: 4.12 10*6/uL (ref 3.70–5.45)
RDW: 13.7 % (ref 11.2–14.5)
WBC: 7.5 10*3/uL (ref 3.9–10.3)
lymph#: 0.5 10*3/uL — ABNORMAL LOW (ref 0.9–3.3)

## 2014-03-16 LAB — COMPREHENSIVE METABOLIC PANEL (CC13)
ALT: 14 U/L (ref 0–55)
AST: 14 U/L (ref 5–34)
Albumin: 3.3 g/dL — ABNORMAL LOW (ref 3.5–5.0)
Alkaline Phosphatase: 100 U/L (ref 40–150)
Anion Gap: 10 mEq/L (ref 3–11)
BUN: 11.7 mg/dL (ref 7.0–26.0)
CO2: 24 mEq/L (ref 22–29)
Calcium: 9.9 mg/dL (ref 8.4–10.4)
Chloride: 98 mEq/L (ref 98–109)
Creatinine: 0.7 mg/dL (ref 0.6–1.1)
Glucose: 104 mg/dl (ref 70–140)
Potassium: 3.9 mEq/L (ref 3.5–5.1)
Sodium: 132 mEq/L — ABNORMAL LOW (ref 136–145)
Total Bilirubin: 0.34 mg/dL (ref 0.20–1.20)
Total Protein: 7.3 g/dL (ref 6.4–8.3)

## 2014-03-16 LAB — MAGNESIUM (CC13): Magnesium: 2.4 mg/dl (ref 1.5–2.5)

## 2014-03-16 MED ORDER — ONDANSETRON 16 MG/50ML IVPB (CHCC)
INTRAVENOUS | Status: AC
  Filled 2014-03-16: qty 16

## 2014-03-16 MED ORDER — LORAZEPAM 2 MG/ML IJ SOLN
INTRAMUSCULAR | Status: AC
  Filled 2014-03-16: qty 1

## 2014-03-16 MED ORDER — POTASSIUM CHLORIDE 2 MEQ/ML IV SOLN
Freq: Once | INTRAVENOUS | Status: AC
  Administered 2014-03-16: 11:00:00 via INTRAVENOUS
  Filled 2014-03-16: qty 10

## 2014-03-16 MED ORDER — SODIUM CHLORIDE 0.9 % IV SOLN
150.0000 mg | Freq: Once | INTRAVENOUS | Status: AC
  Administered 2014-03-16: 150 mg via INTRAVENOUS
  Filled 2014-03-16: qty 5

## 2014-03-16 MED ORDER — ONDANSETRON 16 MG/50ML IVPB (CHCC)
16.0000 mg | Freq: Once | INTRAVENOUS | Status: AC
  Administered 2014-03-16: 16 mg via INTRAVENOUS

## 2014-03-16 MED ORDER — TRAMADOL HCL 50 MG PO TABS: 50.0000 mg | ORAL_TABLET | Freq: Three times a day (TID) | ORAL | Status: DC | PRN

## 2014-03-16 MED ORDER — DEXAMETHASONE SODIUM PHOSPHATE 20 MG/5ML IJ SOLN
12.0000 mg | Freq: Once | INTRAMUSCULAR | Status: AC
  Administered 2014-03-16: 12 mg via INTRAVENOUS

## 2014-03-16 MED ORDER — SODIUM CHLORIDE 0.9 % IV SOLN
Freq: Once | INTRAVENOUS | Status: AC
  Administered 2014-03-16: 11:00:00 via INTRAVENOUS

## 2014-03-16 MED ORDER — LORAZEPAM 2 MG/ML IJ SOLN: 0.5000 mg | INTRAMUSCULAR | Status: DC | PRN

## 2014-03-16 MED ORDER — DEXAMETHASONE SODIUM PHOSPHATE 20 MG/5ML IJ SOLN
INTRAMUSCULAR | Status: AC
  Filled 2014-03-16: qty 5

## 2014-03-16 MED ORDER — SODIUM CHLORIDE 0.9 % IV SOLN
40.0000 mg/m2 | Freq: Once | INTRAVENOUS | Status: AC
  Administered 2014-03-16: 64 mg via INTRAVENOUS
  Filled 2014-03-16: qty 64

## 2014-03-16 NOTE — Patient Instructions (Signed)
[facility] Discharge Instructions for Patients Receiving Chemotherapy  Today you received the following chemotherapy agents: Cisplatin.  To help prevent nausea and vomiting after your treatment, we encourage you to take your nausea medication: Zofran 8 mg every 8 hours as needed.   If you develop nausea and vomiting that is not controlled by your nausea medication, call the clinic.   BELOW ARE SYMPTOMS THAT SHOULD BE REPORTED IMMEDIATELY:  *FEVER GREATER THAN 100.5 F  *CHILLS WITH OR WITHOUT FEVER  NAUSEA AND VOMITING THAT IS NOT CONTROLLED WITH YOUR NAUSEA MEDICATION  *UNUSUAL SHORTNESS OF BREATH  *UNUSUAL BRUISING OR BLEEDING  TENDERNESS IN MOUTH AND THROAT WITH OR WITHOUT PRESENCE OF ULCERS  *URINARY PROBLEMS  *BOWEL PROBLEMS  UNUSUAL RASH Items with * indicate a potential emergency and should be followed up as soon as possible.  Feel free to call the clinic you have any questions or concerns. The clinic phone number is [phone].

## 2014-03-16 NOTE — Progress Notes (Signed)
She is currently in no pain. Pt complains of itching, Loss of Sleep, Fatigue, Pain  Stabbing and aching Pain Occurs Intermittently, over vulva area. She rates pain a 6-7 on the pain scale. Started taking Tramadol 50 mg on Saturday and continues using the sitz bath to decrease discomfort and pain.   Reports some vulva burning during urination.  Pt states they urinate 0 - 1 times per night.  Pt reports, a bowel movement every 2-3 day. The patient eats a regular, healthy diet.Marland Kitchen

## 2014-03-16 NOTE — Progress Notes (Signed)
OFFICE PROGRESS NOTE   03/16/2014   Physicians:Emma Rossi/ W.Brewster ,Rosalio Loud  INTERVAL HISTORY:  Patient is seen, alone in infusion area where she is receiving second weekly sensitizing CDDP, this being given with radiation for advanced vulvar carcinoma which is metastatic to inguinal nodes, skin of thigh and possibly to lung. Visit is in follow up of interventions for significant cancer related pain at visit 03-13-14.   Patient has tolerated tramadol 50 mg q 8 hours without difficulty since this was started on 10-30, with improvement in pain allowing her to sleep ~ 9 hours on 10-31 PM. Appetite and po intake has been better with improvement in the pain. Bowels are moving. She continues sitz baths prn. She denies increased bleeding from ulcerated area of vulvar tumor. She denies increased SOB, cough, chest pain. She is comfortable now in bed in infusion area, did drink all of oral prehydration for the CDDP.  [place] dietician to see today  She does not have PAC.  She had flu vaccine  ONCOLOGIC HISTORY Patient had no regular medical care at least since moving to [place] several years ago. She had gradually enlarging mass at right vulva for ~ 8 months, uncomfortable with direct pressure and ulcerated with occasional bleeding. She was seen in ED on 02-02-14 with right inguinal adenopathy and right vulvar masses, then seen by Dr Elonda Husky on 02-03-14, clinically with vulvar carcinoma. She was then seen in consultation by Dr Skeet Latch on 02-05-14, with biopsy of both vulvar mass and satellite lesions on inner thigh and mons. Her exam found 7 cm right vulvar mass with 4 cm extension to distal vagina, extending anteriorly to urethral meatus and in ischiorectal area, with satellite lesions inner thigh and right mons. Pathology [phone]) showed invasive squamous cell carcinoma arising in background of high grade squamous intraepithelial lesion from vulva biopsy and invasive moderately differentiated  squamous cell carcinoma from inner thigh lesion. CXR 02-06-14 showed left lower lobe lung mass 3.6 x 3.6 x 3.6 cm, with no adenopathy and no bony lesions, emphysematous changes. CT chest 02-09-14 showed the lower lobe mass 3.7 x 3.5 x 3.3 cm as well as 3 mm RLL nodule and faint 4 mm LUL nodule, no mediastinal or hilar nodes, no axillary or supraclavicular nodes, and upper abdomen not remarkable. PET 02-18-14 measured LLL lung mass 4.6 x 3.9 cm, hypermetabolic, and noted multiple subcentimeter nodules scattered thruout lungs bilaterally; the right vulvar mass was also hypermetabolic, extending up into right side of lower vagina, extensive R>L and right external iliac adenopathy, no uptake in skeleton. She had CT biopsy of LLL pulmonary mass on 02-24-14, that pathology not yet available in EMR, tho I understand pathologist favors primary lung squamous cell carcinoma. After initial consideration of bilateral inguinal lymphadenectomy, plan is now to begin treatment with radiation. She had first of 3 stereotactic body radiation treatments to the LLL pulmonary mass today, with SBRT also planned 10-20 and 03-05-14. She began IMRT to vulvar involvement on 03-09-14, planned thru 04-24-14, with weekly CDDP as radiation sensitizer, first CDDP 03-09-14   Review of systems as above, also: Peripheral IV access fine today. No nausea or vomiting. No fever or symptoms of infection. No new or worse pain. No LE swelling Remainder of 10 point Review of Systems negative.  Objective:  Vital signs in last 24 hours: per chemo flow sheets, with BP improved today at 110/59.  Alert, oriented and appropriate, smiling, looks more comfortable today. Respirations not labored RA. No alopecia  HEENT:PERRL,  sclerae not icteric. Oral mucosa moist without lesions, posterior pharynx clear.  Neck supple. No JVD.  Lymphatics:no cervical,suraclavicular adenopathy Resp: no wheezes or rales, diminished BS thruout as is baseline. Cardio:  regular rate and rhythm. No gallop. GI: soft, nontender, not distended, no mass or organomegaly. Normally active bowel sounds.  Musculoskeletal/ Extremities: LE without pitting edema, cords, tenderness Neuro:speech fluent and appropriate, moves easily in bed Skin without rash, ecchymosis, petechiae   Lab Results:  Results for orders placed or performed in visit on 03/09/14  CBC with Differential  Result Value Ref Range   WBC 8.4 3.9 - 10.3 10e3/uL   NEUT# 6.2 1.5 - 6.5 10e3/uL   HGB 13.0 11.6 - 15.9 g/dL   HCT 40.0 34.8 - 46.6 %   Platelets 265 145 - 400 10e3/uL   MCV 87.9 79.5 - 101.0 fL   MCH 28.7 25.1 - 34.0 pg   MCHC 32.6 31.5 - 36.0 g/dL   RBC 4.55 3.70 - 5.45 10e6/uL   RDW 13.8 11.2 - 14.5 %   lymph# 1.0 0.9 - 3.3 10e3/uL   MONO# 0.9 0.1 - 0.9 10e3/uL   Eosinophils Absolute 0.2 0.0 - 0.5 10e3/uL   Basophils Absolute 0.1 0.0 - 0.1 10e3/uL   NEUT% 73.7 38.4 - 76.8 %   LYMPH% 12.0 (L) 14.0 - 49.7 %   MONO% 10.5 0.0 - 14.0 %   EOS% 2.8 0.0 - 7.0 %   BASO% 1.0 0.0 - 2.0 %  Comprehensive metabolic panel (Cmet) - [facility]  Result Value Ref Range   Sodium 137 136 - 145 mEq/L   Potassium 4.0 3.5 - 5.1 mEq/L   Chloride 102 98 - 109 mEq/L   CO2 24 22 - 29 mEq/L   Glucose 106 70 - 140 mg/dl   BUN 11.6 7.0 - 26.0 mg/dL   Creatinine 0.8 0.6 - 1.1 mg/dL   Total Bilirubin 0.36 0.20 - 1.20 mg/dL   Alkaline Phosphatase 99 40 - 150 U/L   AST 11 5 - 34 U/L   ALT 11 0 - 55 U/L   Total Protein 7.8 6.4 - 8.3 g/dL   Albumin 3.5 3.5 - 5.0 g/dL   Calcium 10.2 8.4 - 10.4 mg/dL   Anion Gap 11 3 - 11 mEq/L  Magnesium  Result Value Ref Range   Magnesium 2.3 1.5 - 2.5 mg/dl     Studies/Results:  No results found.  Medications: I have reviewed the patient's current medications. Continue tramadol 50 mg q 8 hr prn pain. Continue topical lidocaine to vulvar area, will be able to mix with zinc oxide after RT completes.    Assessment/Plan: 1.advanced squamous cell carcinoma of vulva  involving distal vagina, right inguinal and external iliac nodes, with metastatic disease to skin of right thigh and likely to lungs. She is symptomatic from extent of perineal disease. IMRT with weekly CDDP begun 03-09-14. Pain improved with tramadol, did not tolerate oxycodone well prior.  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 Also multiple bilateral subcentimeter pulmonary nodules concerning for metastatic disease. SBRT to the LLL mass. Follow pulmonary symptoms closely as other areas are treated, reimage at least after completion of RT to vulvar area. 3.long past tobacco  4. BP still fairly low, tho better than last week. No antihypertensives. Continue to push po fluids and follow 5.history migraine HAs. No HA with zofran.  6.allergy PCN  7.BTL  8.has been given information for  Advance Directives 9.flu vaccine given   She knows that she can call prior to next appointment if needed. Chemo orders confirmed.      Ariauna Farabee P, MD   03/16/2014, 11:19 AM

## 2014-03-16 NOTE — Progress Notes (Signed)
   Weekly Management Note:  Outpatient    ICD-9-CM ICD-10-CM   1. Primary vulvar cancer 184.4 C51.9     Current Dose: 12 Gy  Projected Dose: 60 Gy   Narrative:  The patient presents for routine under treatment assessment.  CBCT/MVCT images/Port film x-rays were reviewed.  The chart was checked. A little more vulvar irritation. Uses Biafine, Sitz baths.  2nd cycle CDDP today.  Physical Findings:  Filed Vitals:   03/16/14 0847  BP: 110/59  Pulse: 72  Temp: 98.6 F (37 C)  Resp: 12  NAD    CBC    Component Value Date/Time   WBC 8.4 03/09/2014 0759   WBC 7.4 02/23/2014 0730   RBC 4.55 03/09/2014 0759   RBC 4.33 02/23/2014 0730   HGB 13.0 03/09/2014 0759   HGB 12.5 02/23/2014 0730   HCT 40.0 03/09/2014 0759   HCT 38.2 02/23/2014 0730   PLT 265 03/09/2014 0759   PLT 203 02/23/2014 0730   MCV 87.9 03/09/2014 0759   MCV 88.2 02/23/2014 0730   MCH 28.7 03/09/2014 0759   MCH 28.9 02/23/2014 0730   MCHC 32.6 03/09/2014 0759   MCHC 32.7 02/23/2014 0730   RDW 13.8 03/09/2014 0759   RDW 14.1 02/23/2014 0730   LYMPHSABS 1.0 03/09/2014 0759   LYMPHSABS 1.1 02/23/2014 0730   MONOABS 0.9 03/09/2014 0759   MONOABS 0.9 02/23/2014 0730   EOSABS 0.2 03/09/2014 0759   EOSABS 0.2 02/23/2014 0730   BASOSABS 0.1 03/09/2014 0759   BASOSABS 0.0 02/23/2014 0730     CMP     Component Value Date/Time   NA 137 03/09/2014 0801   NA 140 02/06/2014 1110   K 4.0 03/09/2014 0801   K 4.9 02/06/2014 1110   CL 101 02/06/2014 1110   CO2 24 03/09/2014 0801   CO2 26 02/06/2014 1110   GLUCOSE 106 03/09/2014 0801   GLUCOSE 105* 02/06/2014 1110   BUN 11.6 03/09/2014 0801   BUN 8 02/06/2014 1110   CREATININE 0.8 03/09/2014 0801   CREATININE 0.72 02/06/2014 1110   CALCIUM 10.2 03/09/2014 0801   CALCIUM 10.3 02/06/2014 1110   PROT 7.8 03/09/2014 0801   PROT 8.2 02/06/2014 1110   ALBUMIN 3.5 03/09/2014 0801   ALBUMIN 4.3 02/06/2014 1110   AST 11 03/09/2014 0801   AST 15 02/06/2014 1110   ALT 11 03/09/2014 0801   ALT 11 02/06/2014 1110   ALKPHOS 99 03/09/2014 0801   ALKPHOS 97 02/06/2014 1110   BILITOT 0.36 03/09/2014 0801   BILITOT 0.3 02/06/2014 1110   GFRNONAA >90 02/06/2014 1110   GFRAA >90 02/06/2014 1110     Impression:  The patient is tolerating radiotherapy.   Plan:  Continue radiotherapy as planned.  -----------------------------------  [name], MD

## 2014-03-16 NOTE — Progress Notes (Signed)
Nutrition followup completed with patient in chemotherapy.  Patient reports she feels well.  No nutrition side effects.  Weight down slightly and documented as 128.2 pounds November 2.  Nutrition diagnosis for nutrition related knowledge deficit improved.  Intervention: Patient educated to consume more frequent meals with high-calorie, high-protein foods to promote weight maintenance. Encouraged oral nutrition supplements as needed to promote weight maintenance. Teach back method used.  Patient encouraged to call with questions.  Monitoring, evaluation, goals: Patient will work to increase oral intake to promote weight maintenance.  Next visit Monday, November 16, during chemotherapy.  **Disclaimer: This note was dictated with voice recognition software. Similar sounding words can inadvertently be transcribed and this note may contain transcription errors which may not have been corrected upon publication of note.**

## 2014-03-17 ENCOUNTER — Ambulatory Visit
Admission: RE | Admit: 2014-03-17 | Discharge: 2014-03-17 | Disposition: A | Discharge status: Home / Self Care | Payer: BC Managed Care – PPO | Source: Ambulatory Visit | Attending: Radiation Oncology | Admitting: Radiation Oncology

## 2014-03-17 DIAGNOSIS — Z510 Encounter for antineoplastic radiation therapy: Secondary | ICD-10-CM | POA: Diagnosis not present

## 2014-03-18 ENCOUNTER — Ambulatory Visit
Admission: RE | Admit: 2014-03-18 | Discharge: 2014-03-18 | Disposition: A | Discharge status: Home / Self Care | Payer: BC Managed Care – PPO | Source: Ambulatory Visit | Attending: Radiation Oncology | Admitting: Radiation Oncology

## 2014-03-18 DIAGNOSIS — Z510 Encounter for antineoplastic radiation therapy: Secondary | ICD-10-CM | POA: Diagnosis not present

## 2014-03-19 ENCOUNTER — Ambulatory Visit
Admission: RE | Admit: 2014-03-19 | Discharge: 2014-03-19 | Disposition: A | Discharge status: Home / Self Care | Payer: BC Managed Care – PPO | Source: Ambulatory Visit | Attending: Radiation Oncology | Admitting: Radiation Oncology

## 2014-03-19 VITALS — BP 111/64 | HR 70 | Temp 98.1°F | Resp 16

## 2014-03-19 DIAGNOSIS — C519 Malignant neoplasm of vulva, unspecified: Secondary | ICD-10-CM

## 2014-03-19 DIAGNOSIS — Z510 Encounter for antineoplastic radiation therapy: Secondary | ICD-10-CM | POA: Diagnosis not present

## 2014-03-19 MED ORDER — BIAFINE EX EMUL
Freq: Two times a day (BID) | CUTANEOUS | Status: DC
  Administered 2014-03-19: 10:00:00 via TOPICAL

## 2014-03-19 NOTE — Progress Notes (Signed)
Jody Beard reports today with report of 1 episode of vaginal bleed during urination on yesterday.  " It looked like I was on my period".  She could not quantify amount.  She presently denies any spotting, or blood on tissue when wiping.  Notified Dr.  Sondra Come, and he stated he felt the bleeding was caused by breakdown of the tumor and this was reported to [name].  Educated her on informing this RN if she has any further episodes and she stated agreement.  She grades her pain as a level 3/10 in the vulva region. Continuing sitz baths and application of Biafine which she reports is very soothing. Will notify Dr. Isidore Moos.

## 2014-03-20 ENCOUNTER — Ambulatory Visit
Admission: RE | Admit: 2014-03-20 | Discharge: 2014-03-20 | Disposition: A | Discharge status: Home / Self Care | Payer: BC Managed Care – PPO | Source: Ambulatory Visit | Attending: Radiation Oncology | Admitting: Radiation Oncology

## 2014-03-20 DIAGNOSIS — Z510 Encounter for antineoplastic radiation therapy: Secondary | ICD-10-CM | POA: Diagnosis not present

## 2014-03-23 ENCOUNTER — Ambulatory Visit (HOSPITAL_BASED_OUTPATIENT_CLINIC_OR_DEPARTMENT_OTHER): Payer: BC Managed Care – PPO

## 2014-03-23 ENCOUNTER — Ambulatory Visit
Admission: RE | Admit: 2014-03-23 | Discharge: 2014-03-23 | Disposition: A | Discharge status: Home / Self Care | Payer: BC Managed Care – PPO | Source: Ambulatory Visit | Attending: Radiation Oncology | Admitting: Radiation Oncology

## 2014-03-23 ENCOUNTER — Other Ambulatory Visit (HOSPITAL_BASED_OUTPATIENT_CLINIC_OR_DEPARTMENT_OTHER): Payer: BC Managed Care – PPO

## 2014-03-23 ENCOUNTER — Encounter: Payer: Self-pay | Admitting: Radiation Oncology

## 2014-03-23 VITALS — BP 108/68 | HR 85 | Temp 97.6°F | Ht 61.0 in | Wt 125.8 lb

## 2014-03-23 DIAGNOSIS — Z5111 Encounter for antineoplastic chemotherapy: Secondary | ICD-10-CM

## 2014-03-23 DIAGNOSIS — C792 Secondary malignant neoplasm of skin: Secondary | ICD-10-CM

## 2014-03-23 DIAGNOSIS — R197 Diarrhea, unspecified: Secondary | ICD-10-CM | POA: Insufficient documentation

## 2014-03-23 DIAGNOSIS — C519 Malignant neoplasm of vulva, unspecified: Secondary | ICD-10-CM | POA: Insufficient documentation

## 2014-03-23 DIAGNOSIS — Z510 Encounter for antineoplastic radiation therapy: Secondary | ICD-10-CM | POA: Diagnosis not present

## 2014-03-23 DIAGNOSIS — C778 Secondary and unspecified malignant neoplasm of lymph nodes of multiple regions: Secondary | ICD-10-CM

## 2014-03-23 LAB — CBC WITH DIFFERENTIAL/PLATELET
BASO%: 0.9 % (ref 0.0–2.0)
BASOS ABS: 0 10*3/uL (ref 0.0–0.1)
EOS%: 2.3 % (ref 0.0–7.0)
Eosinophils Absolute: 0.1 10*3/uL (ref 0.0–0.5)
HEMATOCRIT: 36.3 % (ref 34.8–46.6)
HEMOGLOBIN: 11.9 g/dL (ref 11.6–15.9)
LYMPH%: 8.5 % — AB (ref 14.0–49.7)
MCH: 28.6 pg (ref 25.1–34.0)
MCHC: 32.8 g/dL (ref 31.5–36.0)
MCV: 87 fL (ref 79.5–101.0)
MONO#: 0.4 10*3/uL (ref 0.1–0.9)
MONO%: 10.3 % (ref 0.0–14.0)
NEUT#: 3.3 10*3/uL (ref 1.5–6.5)
NEUT%: 78 % — AB (ref 38.4–76.8)
Platelets: 146 10*3/uL (ref 145–400)
RBC: 4.18 10*6/uL (ref 3.70–5.45)
RDW: 13.5 % (ref 11.2–14.5)
WBC: 4.3 10*3/uL (ref 3.9–10.3)
lymph#: 0.4 10*3/uL — ABNORMAL LOW (ref 0.9–3.3)

## 2014-03-23 LAB — COMPREHENSIVE METABOLIC PANEL (CC13)
ALK PHOS: 103 U/L (ref 40–150)
ALT: 19 U/L (ref 0–55)
AST: 15 U/L (ref 5–34)
Albumin: 3.4 g/dL — ABNORMAL LOW (ref 3.5–5.0)
Anion Gap: 9 mEq/L (ref 3–11)
BILIRUBIN TOTAL: 0.27 mg/dL (ref 0.20–1.20)
BUN: 13 mg/dL (ref 7.0–26.0)
CO2: 23 mEq/L (ref 22–29)
CREATININE: 0.7 mg/dL (ref 0.6–1.1)
Calcium: 10 mg/dL (ref 8.4–10.4)
Chloride: 99 mEq/L (ref 98–109)
Glucose: 95 mg/dl (ref 70–140)
Potassium: 4.1 mEq/L (ref 3.5–5.1)
Sodium: 132 mEq/L — ABNORMAL LOW (ref 136–145)
Total Protein: 7.1 g/dL (ref 6.4–8.3)

## 2014-03-23 LAB — MAGNESIUM (CC13): Magnesium: 2.1 mg/dl (ref 1.5–2.5)

## 2014-03-23 MED ORDER — POTASSIUM CHLORIDE 2 MEQ/ML IV SOLN
Freq: Once | INTRAVENOUS | Status: AC
  Administered 2014-03-23: 11:00:00 via INTRAVENOUS
  Filled 2014-03-23: qty 10

## 2014-03-23 MED ORDER — DEXAMETHASONE SODIUM PHOSPHATE 20 MG/5ML IJ SOLN
12.0000 mg | Freq: Once | INTRAMUSCULAR | Status: AC
  Administered 2014-03-23: 12 mg via INTRAVENOUS

## 2014-03-23 MED ORDER — ONDANSETRON 16 MG/50ML IVPB (CHCC)
16.0000 mg | Freq: Once | INTRAVENOUS | Status: AC
  Administered 2014-03-23: 16 mg via INTRAVENOUS

## 2014-03-23 MED ORDER — ONDANSETRON 16 MG/50ML IVPB (CHCC)
INTRAVENOUS | Status: AC
  Filled 2014-03-23: qty 16

## 2014-03-23 MED ORDER — SILVER SULFADIAZINE 1 % EX CREA
TOPICAL_CREAM | Freq: Two times a day (BID) | CUTANEOUS | Status: DC
  Administered 2014-03-23: 10:00:00 via TOPICAL

## 2014-03-23 MED ORDER — SODIUM CHLORIDE 0.9 % IV SOLN
150.0000 mg | Freq: Once | INTRAVENOUS | Status: AC
  Administered 2014-03-23: 150 mg via INTRAVENOUS
  Filled 2014-03-23: qty 5

## 2014-03-23 MED ORDER — DEXAMETHASONE SODIUM PHOSPHATE 20 MG/5ML IJ SOLN
INTRAMUSCULAR | Status: AC
  Filled 2014-03-23: qty 5

## 2014-03-23 MED ORDER — CISPLATIN CHEMO INJECTION 100MG/100ML
40.0000 mg/m2 | Freq: Once | INTRAVENOUS | Status: AC
  Administered 2014-03-23: 64 mg via INTRAVENOUS
  Filled 2014-03-23: qty 64

## 2014-03-23 NOTE — Progress Notes (Signed)
   Weekly Management Note:  Outpatient    ICD-9-CM ICD-10-CM   1. Primary vulvar cancer 184.4 C51.9     Current Dose:  22 Gy  Projected Dose: 60 Gy   Narrative:  The patient presents for routine under treatment assessment.  CBCT/MVCT images/Port film x-rays were reviewed.  The chart was checked. Increased irritation in vulvar area. Some diarrhea.  Physical Findings:  height is 5\' 1"  (1.549 m) and weight is 125 lb 12.8 oz (57.063 kg). Her temperature is 97.6 F (36.4 C). Her blood pressure is 108/68 and her pulse is 85.  early moist desquamation over vulvar tumors  CBC    Component Value Date/Time   WBC 7.5 03/16/2014 0925   WBC 7.4 02/23/2014 0730   RBC 4.12 03/16/2014 0925   RBC 4.33 02/23/2014 0730   HGB 11.6 03/16/2014 0925   HGB 12.5 02/23/2014 0730   HCT 35.8 03/16/2014 0925   HCT 38.2 02/23/2014 0730   PLT 214 03/16/2014 0925   PLT 203 02/23/2014 0730   MCV 86.9 03/16/2014 0925   MCV 88.2 02/23/2014 0730   MCH 28.1 03/16/2014 0925   MCH 28.9 02/23/2014 0730   MCHC 32.4 03/16/2014 0925   MCHC 32.7 02/23/2014 0730   RDW 13.7 03/16/2014 0925   RDW 14.1 02/23/2014 0730   LYMPHSABS 0.5* 03/16/2014 0925   LYMPHSABS 1.1 02/23/2014 0730   MONOABS 0.8 03/16/2014 0925   MONOABS 0.9 02/23/2014 0730   EOSABS 0.1 03/16/2014 0925   EOSABS 0.2 02/23/2014 0730   BASOSABS 0.1 03/16/2014 0925   BASOSABS 0.0 02/23/2014 0730     CMP     Component Value Date/Time   NA 132* 03/16/2014 0925   NA 140 02/06/2014 1110   K 3.9 03/16/2014 0925   K 4.9 02/06/2014 1110   CL 101 02/06/2014 1110   CO2 24 03/16/2014 0925   CO2 26 02/06/2014 1110   GLUCOSE 104 03/16/2014 0925   GLUCOSE 105* 02/06/2014 1110   BUN 11.7 03/16/2014 0925   BUN 8 02/06/2014 1110   CREATININE 0.7 03/16/2014 0925   CREATININE 0.72 02/06/2014 1110   CALCIUM 9.9 03/16/2014 0925   CALCIUM 10.3 02/06/2014 1110   PROT 7.3 03/16/2014 0925   PROT 8.2 02/06/2014 1110   ALBUMIN 3.3* 03/16/2014 0925   ALBUMIN  4.3 02/06/2014 1110   AST 14 03/16/2014 0925   AST 15 02/06/2014 1110   ALT 14 03/16/2014 0925   ALT 11 02/06/2014 1110   ALKPHOS 100 03/16/2014 0925   ALKPHOS 97 02/06/2014 1110   BILITOT 0.34 03/16/2014 0925   BILITOT 0.3 02/06/2014 1110   GFRNONAA >90 02/06/2014 1110   GFRAA >90 02/06/2014 1110     Impression:  The patient is tolerating radiotherapy.   Plan:  Continue radiotherapy as planned. Start Silvadene in place of biafine. Imodium PRN diarrhea.  -----------------------------------  [name], MD

## 2014-03-23 NOTE — Patient Instructions (Signed)
[facility] Discharge Instructions for Patients Receiving Chemotherapy  Today you received the following chemotherapy agents Cisplatin.  To help prevent nausea and vomiting after your treatment, we encourage you to take your nausea medication as prescribed.   If you develop nausea and vomiting that is not controlled by your nausea medication, call the clinic.   BELOW ARE SYMPTOMS THAT SHOULD BE REPORTED IMMEDIATELY:  *FEVER GREATER THAN 100.5 F  *CHILLS WITH OR WITHOUT FEVER  NAUSEA AND VOMITING THAT IS NOT CONTROLLED WITH YOUR NAUSEA MEDICATION  *UNUSUAL SHORTNESS OF BREATH  *UNUSUAL BRUISING OR BLEEDING  TENDERNESS IN MOUTH AND THROAT WITH OR WITHOUT PRESENCE OF ULCERS  *URINARY PROBLEMS  *BOWEL PROBLEMS  UNUSUAL RASH Items with * indicate a potential emergency and should be followed up as soon as possible.  Feel free to call the clinic you have any questions or concerns. The clinic phone number is [phone].    

## 2014-03-23 NOTE — Progress Notes (Signed)
Ms. Fischel has received 11 fractions to he vulva region.  She has not experienced any further bleeding as she reported on 03/19/14.  She states that she is experiencing more discomfort in the right labial region and reports that sitting increases pain in this area to a level 8/10.Currently standing while being assessed.  She reports 2 episodes of diarrhea on Saturday and Sunday.  To obtain Imodium today.

## 2014-03-23 NOTE — Progress Notes (Signed)
Given Silvadene cream as ordered by Dr. Isidore Moos with instructions to apply BID to the vulva region.  Also instructed to cleanse off old product, as thoroughly as possible, between applications.  Instructed to stop use if she has prolonged, uncomfortable stinging that does not subside after applying Silvadene and to notify Dr. Isidore Moos and this RN.  She stated understanding.  She does not have any sulfa allergies as confirmed by patient and her updated allergy/contraindication list as documented in Epic.

## 2014-03-23 NOTE — Addendum Note (Signed)
Encounter addended by: Deirdre Evener, RN on: 03/23/2014  9:53 AM<BR>     Documentation filed: [employer], Inpatient MAR, Orders

## 2014-03-23 NOTE — Addendum Note (Signed)
Encounter addended by: Deirdre Evener, RN on: 03/23/2014 10:00 AM<BR>     Documentation filed: Notes Section

## 2014-03-24 ENCOUNTER — Ambulatory Visit
Admission: RE | Admit: 2014-03-24 | Discharge: 2014-03-24 | Disposition: A | Discharge status: Home / Self Care | Payer: BC Managed Care – PPO | Source: Ambulatory Visit | Attending: Radiation Oncology | Admitting: Radiation Oncology

## 2014-03-24 ENCOUNTER — Telehealth: Payer: Self-pay | Admitting: *Deleted

## 2014-03-24 DIAGNOSIS — Z510 Encounter for antineoplastic radiation therapy: Secondary | ICD-10-CM | POA: Diagnosis not present

## 2014-03-24 NOTE — Telephone Encounter (Signed)
Called to check on patient as noted below by Dr. Marko Plume. Pt states she is drinking at least 64 ounces of fluid daily and has not had diarrhea since Sunday evening. She states she is doing the sitz baths several times a day as well. Told pt she can use lidocaine and then the Silvadene cream on top as it might be helpful. Pt agreeable to try this and very appreciative of call.

## 2014-03-24 NOTE — Telephone Encounter (Signed)
-----   Message from Gordy Levan, MD sent at 03/24/2014  7:25 AM EST ----- Labs seen and need follow up: please check on her by phone 11-10 if possible, as I did not see her with sensitizing CDDP this week and Dr Pearlie Oyster last note says some RT diarrhea. Be sure she is able to take po fluids adequately so not dehydrated, remind her that she can take up to 8 imodium in 24 hrs and to let RT tech/ RN or MD know if imodium does not control the diarrhea. She needs to do sitz baths after BM to keep perineum clean. I believe Dr Isidore Moos is having her use Silvadene now - tell her that she can put the lidocaine gel on first and Silvadene on top of that if helpful.

## 2014-03-25 ENCOUNTER — Ambulatory Visit
Admission: RE | Admit: 2014-03-25 | Discharge: 2014-03-25 | Disposition: A | Discharge status: Home / Self Care | Payer: BC Managed Care – PPO | Source: Ambulatory Visit | Attending: Radiation Oncology | Admitting: Radiation Oncology

## 2014-03-25 ENCOUNTER — Encounter: Payer: Self-pay | Admitting: Radiation Oncology

## 2014-03-25 DIAGNOSIS — Z510 Encounter for antineoplastic radiation therapy: Secondary | ICD-10-CM | POA: Diagnosis not present

## 2014-03-25 NOTE — Progress Notes (Signed)
11.11.15:  Rec'd FMLA paperwork back from physician.  Gave original to patient and faxed copy to [employer] at [phone] - scanned copy

## 2014-03-26 ENCOUNTER — Ambulatory Visit
Admission: RE | Admit: 2014-03-26 | Discharge: 2014-03-26 | Disposition: A | Discharge status: Home / Self Care | Payer: BC Managed Care – PPO | Source: Ambulatory Visit | Attending: Radiation Oncology | Admitting: Radiation Oncology

## 2014-03-26 DIAGNOSIS — Z510 Encounter for antineoplastic radiation therapy: Secondary | ICD-10-CM | POA: Diagnosis not present

## 2014-03-27 ENCOUNTER — Ambulatory Visit
Admission: RE | Admit: 2014-03-27 | Discharge: 2014-03-27 | Disposition: A | Discharge status: Home / Self Care | Payer: BC Managed Care – PPO | Source: Ambulatory Visit | Attending: Radiation Oncology | Admitting: Radiation Oncology

## 2014-03-27 DIAGNOSIS — Z510 Encounter for antineoplastic radiation therapy: Secondary | ICD-10-CM | POA: Diagnosis not present

## 2014-03-29 ENCOUNTER — Other Ambulatory Visit: Payer: Self-pay | Admitting: Oncology

## 2014-03-30 ENCOUNTER — Encounter: Payer: Self-pay | Admitting: Radiation Oncology

## 2014-03-30 ENCOUNTER — Ambulatory Visit
Admission: RE | Admit: 2014-03-30 | Discharge: 2014-03-30 | Disposition: A | Discharge status: Home / Self Care | Payer: BC Managed Care – PPO | Source: Ambulatory Visit | Attending: Radiation Oncology | Admitting: Radiation Oncology

## 2014-03-30 ENCOUNTER — Ambulatory Visit: Payer: BC Managed Care – PPO | Admitting: Nutrition

## 2014-03-30 ENCOUNTER — Ambulatory Visit (HOSPITAL_BASED_OUTPATIENT_CLINIC_OR_DEPARTMENT_OTHER): Payer: BC Managed Care – PPO

## 2014-03-30 ENCOUNTER — Other Ambulatory Visit (HOSPITAL_BASED_OUTPATIENT_CLINIC_OR_DEPARTMENT_OTHER): Payer: BC Managed Care – PPO

## 2014-03-30 ENCOUNTER — Encounter: Payer: Self-pay | Admitting: Oncology

## 2014-03-30 ENCOUNTER — Ambulatory Visit (HOSPITAL_BASED_OUTPATIENT_CLINIC_OR_DEPARTMENT_OTHER): Payer: BC Managed Care – PPO | Admitting: Oncology

## 2014-03-30 VITALS — BP 116/59 | HR 69 | Temp 97.8°F | Resp 18 | Ht 61.0 in | Wt 126.4 lb

## 2014-03-30 VITALS — BP 108/56 | HR 66 | Temp 97.7°F | Resp 12 | Wt 126.0 lb

## 2014-03-30 DIAGNOSIS — C792 Secondary malignant neoplasm of skin: Secondary | ICD-10-CM

## 2014-03-30 DIAGNOSIS — C519 Malignant neoplasm of vulva, unspecified: Secondary | ICD-10-CM

## 2014-03-30 DIAGNOSIS — Z5111 Encounter for antineoplastic chemotherapy: Secondary | ICD-10-CM

## 2014-03-30 DIAGNOSIS — Z510 Encounter for antineoplastic radiation therapy: Secondary | ICD-10-CM | POA: Diagnosis not present

## 2014-03-30 DIAGNOSIS — C774 Secondary and unspecified malignant neoplasm of inguinal and lower limb lymph nodes: Secondary | ICD-10-CM

## 2014-03-30 DIAGNOSIS — C3432 Malignant neoplasm of lower lobe, left bronchus or lung: Secondary | ICD-10-CM

## 2014-03-30 LAB — COMPREHENSIVE METABOLIC PANEL (CC13)
ALK PHOS: 109 U/L (ref 40–150)
ALT: 22 U/L (ref 0–55)
AST: 16 U/L (ref 5–34)
Albumin: 3.6 g/dL (ref 3.5–5.0)
Anion Gap: 8 mEq/L (ref 3–11)
BUN: 10 mg/dL (ref 7.0–26.0)
CHLORIDE: 100 meq/L (ref 98–109)
CO2: 24 mEq/L (ref 22–29)
Calcium: 10 mg/dL (ref 8.4–10.4)
Creatinine: 0.7 mg/dL (ref 0.6–1.1)
Glucose: 91 mg/dl (ref 70–140)
Potassium: 4 mEq/L (ref 3.5–5.1)
Sodium: 132 mEq/L — ABNORMAL LOW (ref 136–145)
Total Bilirubin: 0.32 mg/dL (ref 0.20–1.20)
Total Protein: 7.2 g/dL (ref 6.4–8.3)

## 2014-03-30 LAB — CBC WITH DIFFERENTIAL/PLATELET
BASO%: 0.3 % (ref 0.0–2.0)
Basophils Absolute: 0 10*3/uL (ref 0.0–0.1)
EOS%: 2.8 % (ref 0.0–7.0)
Eosinophils Absolute: 0.1 10*3/uL (ref 0.0–0.5)
HEMATOCRIT: 36.3 % (ref 34.8–46.6)
HGB: 12.1 g/dL (ref 11.6–15.9)
LYMPH#: 0.5 10*3/uL — AB (ref 0.9–3.3)
LYMPH%: 14.2 % (ref 14.0–49.7)
MCH: 28.9 pg (ref 25.1–34.0)
MCHC: 33.3 g/dL (ref 31.5–36.0)
MCV: 86.6 fL (ref 79.5–101.0)
MONO#: 0.3 10*3/uL (ref 0.1–0.9)
MONO%: 9.2 % (ref 0.0–14.0)
NEUT#: 2.6 10*3/uL (ref 1.5–6.5)
NEUT%: 73.5 % (ref 38.4–76.8)
NRBC: 0 % (ref 0–0)
Platelets: 146 10*3/uL (ref 145–400)
RBC: 4.19 10*6/uL (ref 3.70–5.45)
RDW: 14.1 % (ref 11.2–14.5)
WBC: 3.6 10*3/uL — AB (ref 3.9–10.3)

## 2014-03-30 LAB — MAGNESIUM (CC13): Magnesium: 2.2 mg/dl (ref 1.5–2.5)

## 2014-03-30 MED ORDER — POTASSIUM CHLORIDE 2 MEQ/ML IV SOLN
Freq: Once | INTRAVENOUS | Status: AC
  Administered 2014-03-30: 12:00:00 via INTRAVENOUS
  Filled 2014-03-30: qty 10

## 2014-03-30 MED ORDER — SILVER SULFADIAZINE 1 % EX CREA: 1.0000 "application " | TOPICAL_CREAM | Freq: Every day | CUTANEOUS | Status: DC

## 2014-03-30 MED ORDER — LIDOCAINE HCL 2 % EX GEL: CUTANEOUS | Status: DC

## 2014-03-30 MED ORDER — DEXAMETHASONE SODIUM PHOSPHATE 20 MG/5ML IJ SOLN
12.0000 mg | Freq: Once | INTRAMUSCULAR | Status: AC
  Administered 2014-03-30: 12 mg via INTRAVENOUS

## 2014-03-30 MED ORDER — SODIUM CHLORIDE 0.9 % IV SOLN
40.0000 mg/m2 | Freq: Once | INTRAVENOUS | Status: AC
  Administered 2014-03-30: 64 mg via INTRAVENOUS
  Filled 2014-03-30: qty 64

## 2014-03-30 MED ORDER — DEXAMETHASONE SODIUM PHOSPHATE 20 MG/5ML IJ SOLN
INTRAMUSCULAR | Status: AC
  Filled 2014-03-30: qty 5

## 2014-03-30 MED ORDER — ONDANSETRON 16 MG/50ML IVPB (CHCC)
16.0000 mg | Freq: Once | INTRAVENOUS | Status: AC
  Administered 2014-03-30: 16 mg via INTRAVENOUS

## 2014-03-30 MED ORDER — SODIUM CHLORIDE 0.9 % IV SOLN
Freq: Once | INTRAVENOUS | Status: AC
  Administered 2014-03-30: 11:00:00 via INTRAVENOUS

## 2014-03-30 MED ORDER — SODIUM CHLORIDE 0.9 % IV SOLN
150.0000 mg | Freq: Once | INTRAVENOUS | Status: AC
  Administered 2014-03-30: 150 mg via INTRAVENOUS
  Filled 2014-03-30: qty 5

## 2014-03-30 MED ORDER — ONDANSETRON 16 MG/50ML IVPB (CHCC)
INTRAVENOUS | Status: AC
  Filled 2014-03-30: qty 16

## 2014-03-30 NOTE — Patient Instructions (Signed)
[facility] Discharge Instructions for Patients Receiving Chemotherapy  Today you received the following chemotherapy agents cisplatin.    To help prevent nausea and vomiting after your treatment, we encourage you to take your nausea medication as directed   If you develop nausea and vomiting that is not controlled by your nausea medication, call the clinic.   BELOW ARE SYMPTOMS THAT SHOULD BE REPORTED IMMEDIATELY:  *FEVER GREATER THAN 100.5 F  *CHILLS WITH OR WITHOUT FEVER  NAUSEA AND VOMITING THAT IS NOT CONTROLLED WITH YOUR NAUSEA MEDICATION  *UNUSUAL SHORTNESS OF BREATH  *UNUSUAL BRUISING OR BLEEDING  TENDERNESS IN MOUTH AND THROAT WITH OR WITHOUT PRESENCE OF ULCERS  *URINARY PROBLEMS  *BOWEL PROBLEMS  UNUSUAL RASH Items with * indicate a potential emergency and should be followed up as soon as possible.  Feel free to call the clinic you have any questions or concerns. The clinic phone number is [phone].

## 2014-03-30 NOTE — Progress Notes (Signed)
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 Radiation continues by Dr Isidore Moos, with weekly sensitizing CDDP.  Patient has had some improvement in vulvar area discomfort, such that she has not used any oxycodone in last few days. She still has raw areas and some increasing skin irritation from radiation, using silvadene, lidocaine gel and sitz baths. I have suggested she mix the lidocaine gel in silvadene, as this can be helpful. Vulvar area is puritic in addition to some pain. She had diarrhea and nausea on 03-25-14 "that was a bad day"; she reports transient numbness in left 3 and 4 fingers at that time, which resolved. We have reviewed imodium and she did use antiemetics. She has had no diarrhea since and is eating and drinking fluids. She denies significant bleeding. She denies SOB or cough.   [place] is to see today. She does not have PAC Flu vaccine done  ONCOLOGIC HISTORY Patient had no regular medical care at least since moving to [place] several years ago. She had gradually enlarging mass at right vulva for ~ 8 months, uncomfortable with direct pressure and ulcerated with occasional bleeding. She was seen in ED on 02-02-14 with right inguinal adenopathy and right vulvar masses, then seen by Dr Elonda Husky on 02-03-14, clinically with vulvar carcinoma. She was then seen in consultation by Dr Skeet Latch on 02-05-14, with biopsy of both vulvar mass and satellite lesions on inner thigh and mons. Her exam found 7 cm right vulvar mass with 4 cm extension to distal vagina, extending anteriorly to urethral meatus and in ischiorectal area, with satellite lesions inner thigh and right mons. Pathology [id number]) showed invasive squamous cell  carcinoma arising in background of high grade squamous intraepithelial lesion from vulva biopsy and invasive moderately differentiated squamous cell carcinoma from inner thigh lesion. CXR 02-06-14 showed left lower lobe lung mass 3.6 x 3.6 x 3.6 cm, with no adenopathy and no bony lesions, emphysematous changes. CT chest 02-09-14 showed the lower lobe mass 3.7 x 3.5 x 3.3 cm as well as 3 mm RLL nodule and faint 4 mm LUL nodule, no mediastinal or hilar nodes, no axillary or supraclavicular nodes, and upper abdomen not remarkable. PET 02-18-14 measured LLL lung mass 4.6 x 3.9 cm, hypermetabolic, and noted multiple subcentimeter nodules scattered thruout lungs bilaterally; the right vulvar mass was also hypermetabolic, extending up into right side of lower vagina, extensive R>L and right external iliac adenopathy, no uptake in skeleton. She had CT biopsy of LLL pulmonary mass on 02-24-14, that pathology not yet available in EMR, tho I understand pathologist favors primary lung squamous cell carcinoma. After initial consideration of bilateral inguinal lymphadenectomy, plan is now to begin treatment with radiation. She had first of 3 stereotactic body radiation treatments to the LLL pulmonary mass today, with SBRT also planned 10-20 and 03-05-14. She began IMRT to vulvar involvement on 03-09-14, planned thru 04-24-14, with weekly CDDP as radiation sensitizer, first CDDP 03-09-14  Review of systems as above, also: No fever. No LE swelling. Peripheral IV access not difficult. Sleeping some.Remainder of 10 point Review of Systems negative.  Objective:  Vital signs in last 24 hours:  BP 116/59 mmHg  Pulse 69  Temp(Src) 97.8 F (36.6 C) (Oral)  Resp 18  Ht 5\' 1"  (  1.549 m)  Wt 126 lb 6.4 oz (57.335 kg)  BMI 23.90 kg/m2 weight is down 4 lb 03-13-14.  Alert, oriented and appropriate, looks slightly more comfortable today and able to tolerate sitting on exam table. Ambulatory without assistance.   Noalopecia  HEENT:PERRL, sclerae not icteric. Oral mucosa moist without lesions, posterior pharynx clear.  Neck supple. No JVD.  Lymphatics:no cervical,suraclavicular adenopathy. RIght inguinal node hard but seems slightly smaller, mild erythema but no skin breakdown, not tender to my exam. Resp: diminished BS thruout, otherwise clear to auscultation bilaterally and normal percussion bilaterally Cardio: regular rate and rhythm. No gallop. GI: soft, nontender, not distended, no mass or organomegaly. A few bowel sounds.  Perineum: vulvar mass with scattered skin breakdown/ ulcerations up to 0.5 - 0.8 cm, with whitish exudate there.  Musculoskeletal/ Extremities: without pitting edema, cords, tenderness Neuro: no peripheral neuropathy. Otherwise nonfocal. Strength and sensation left hand intact. Skin: mild erythema in radiation field, otherwise without rash, ecchymosis, petechiae   Lab Results: CBC today WBC 3.6, ANC 2.6, Hgb 12.1, plt 146 CMET with Na 132, K 4.0, creat 0.7, LFTs WNL, Ca 10 Mg 2.2  Studies/Results:  No results found.  Medications: I have reviewed the patient's current medications. Mix lidocaine gel and silvadene as above. After visit I decided we should add acyclovir, for possibility that some of the ulcerations may be herpetic; RN to contact patient.  DISCUSSION: reviewed imodium and oral fluids with diarrhea. Meds as above.  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 She is somewhat more comfortable today with interventions in process. IMRT with weekly CDDP begun 03-09-14, IMRT planned thru 04-24-14. Medication changes as above. 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 Also multiple bilateral subcentimeter pulmonary nodules concerning for metastatic  disease. SBRT to the LLL mass. Follow pulmonary symptoms closely as other areas are treated, reimage at least after completion of RT to vulvar area. 3.long past tobacco  4. BP not as low as previously. No antihypertensives. Continue to push po fluids and follow 5.history migraine HAs. No HA with zofran.  6.allergy PCN  7.BTL  8.has been given information for Advance Directives 9.flu vaccine given   Chemo orders confirmed and signed after labs available today. I will see her back 11-23, 11-30, 12-7.    Donyale Falcon P, MD   03/30/2014, 9:17 AM

## 2014-03-30 NOTE — Progress Notes (Signed)
   Weekly Management Note:  outpatient    ICD-9-CM ICD-10-CM   1. Primary vulvar cancer 184.4 C51.9     Current Dose:  32 Gy  Projected Dose: 60 Gy   Narrative:  The patient presents for routine under treatment assessment.  CBCT/MVCT images/Port film x-rays were reviewed.  The chart was checked. Doing well.  Feels less pain, but more "irritation" due to RT.  Tumor regressing. Using Silvadene.  Physical Findings:  weight is 126 lb (57.153 kg). Her oral temperature is 97.7 F (36.5 C). Her blood pressure is 108/56 and her pulse is 66. Her respiration is 12 and oxygen saturation is 100%.  NAD, obvious tumor regression. Moist desquamation in patches. Whitehead over right upper pubic hair region - outside RT field.  Will watch this to make sure it doesn't behave like a tumor or new satellite lesion.   CBC    Component Value Date/Time   WBC 3.6* 03/30/2014 0957   WBC 7.4 02/23/2014 0730   RBC 4.19 03/30/2014 0957   RBC 4.33 02/23/2014 0730   HGB 12.1 03/30/2014 0957   HGB 12.5 02/23/2014 0730   HCT 36.3 03/30/2014 0957   HCT 38.2 02/23/2014 0730   PLT 146 03/30/2014 0957   PLT 203 02/23/2014 0730   MCV 86.6 03/30/2014 0957   MCV 88.2 02/23/2014 0730   MCH 28.9 03/30/2014 0957   MCH 28.9 02/23/2014 0730   MCHC 33.3 03/30/2014 0957   MCHC 32.7 02/23/2014 0730   RDW 14.1 03/30/2014 0957   RDW 14.1 02/23/2014 0730   LYMPHSABS 0.5* 03/30/2014 0957   LYMPHSABS 1.1 02/23/2014 0730   MONOABS 0.3 03/30/2014 0957   MONOABS 0.9 02/23/2014 0730   EOSABS 0.1 03/30/2014 0957   EOSABS 0.2 02/23/2014 0730   BASOSABS 0.0 03/30/2014 0957   BASOSABS 0.0 02/23/2014 0730     CMP     Component Value Date/Time   NA 132* 03/30/2014 0958   NA 140 02/06/2014 1110   K 4.0 03/30/2014 0958   K 4.9 02/06/2014 1110   CL 101 02/06/2014 1110   CO2 24 03/30/2014 0958   CO2 26 02/06/2014 1110   GLUCOSE 91 03/30/2014 0958   GLUCOSE 105* 02/06/2014 1110   BUN 10.0 03/30/2014 0958   BUN 8 02/06/2014  1110   CREATININE 0.7 03/30/2014 0958   CREATININE 0.72 02/06/2014 1110   CALCIUM 10.0 03/30/2014 0958   CALCIUM 10.3 02/06/2014 1110   PROT 7.2 03/30/2014 0958   PROT 8.2 02/06/2014 1110   ALBUMIN 3.6 03/30/2014 0958   ALBUMIN 4.3 02/06/2014 1110   AST 16 03/30/2014 0958   AST 15 02/06/2014 1110   ALT 22 03/30/2014 0958   ALT 11 02/06/2014 1110   ALKPHOS 109 03/30/2014 0958   ALKPHOS 97 02/06/2014 1110   BILITOT 0.32 03/30/2014 0958   BILITOT 0.3 02/06/2014 1110   GFRNONAA >90 02/06/2014 1110   GFRAA >90 02/06/2014 1110     Impression:  The patient is tolerating radiotherapy. See PE above.  Plan:  Continue radiotherapy as planned.   -----------------------------------  [name], MD

## 2014-03-30 NOTE — Progress Notes (Signed)
She is currently in no pain. Reports vaginal discomfort, with occasional periods of "stinging." Pt complains of, Fatigue.  Denies any abnormal changes to urinary patterns.  Pt states they urinate 0 - 1 times per night.  Pt reports vaginal symptoms of burning and itching, pt reports she has some vaginal bleeding that is red in color and only light spotting, especially when wiping.  She reports this started yesterday and due to her itching.  She reports continued use of lidocaine, silvadene and sitz baths. She reports a lot of vaginal itching. In addition she reports she had numbness in her left ring and middle fingers.  Pt reports Diarrhea has stopped since Friday morning.  Reports she had a soft bowel movement yesterday and hasn't taken Imodium in since Saturday.

## 2014-03-30 NOTE — Progress Notes (Signed)
Patient reports she feels well.   Weight is decreased by 2 pounds and documented at 126 pounds November 16.   Patient states she had diarrhea for 2 days, and this is the reason she has lost a little bit of weight.   No other nutrition concerns.    Nutrition diagnosis: Food and nutrition related knowledge deficit improved.  Intervention:  Patient educated to continue strategies for small, frequent meals and snacks with high-calorie, high-protein foods to minimize weight loss. Encouraged oral nutrition supplements as needed. Teach back method used.  Monitoring, evaluation, goals: Patient will work to increase oral intake to minimize weight loss.  Next visit: Monday, November 30, during chemotherapy.  **Disclaimer: This note was dictated with voice recognition software. Similar sounding words can inadvertently be transcribed and this note may contain transcription errors which may not have been corrected upon publication of note.**

## 2014-03-31 ENCOUNTER — Telehealth: Payer: Self-pay | Admitting: *Deleted

## 2014-03-31 ENCOUNTER — Ambulatory Visit
Admission: RE | Admit: 2014-03-31 | Discharge: 2014-03-31 | Disposition: A | Discharge status: Home / Self Care | Payer: BC Managed Care – PPO | Source: Ambulatory Visit | Attending: Radiation Oncology | Admitting: Radiation Oncology

## 2014-03-31 DIAGNOSIS — Z510 Encounter for antineoplastic radiation therapy: Secondary | ICD-10-CM | POA: Diagnosis not present

## 2014-03-31 MED ORDER — ACYCLOVIR 200 MG PO CAPS: 200.0000 mg | ORAL_CAPSULE | Freq: Three times a day (TID) | ORAL | Status: DC

## 2014-03-31 NOTE — Telephone Encounter (Signed)
-----   Message from Gordy Levan, MD sent at 03/31/2014  8:27 AM EST ----- Please let her know I thought might be useful to try putting her on acyclovir in case any of the ulcerated areas are from viral infection. She should not have any problems from the acyclovir, and we can stop it in a couple of weeks if does not help. Please send in acyclovir 200 mg tid #90 thanks

## 2014-03-31 NOTE — Telephone Encounter (Signed)
Called pt as noted below by Dr. Marko Plume. She is agreeable to start acyclovir. Script sent to pharamcy and pt agreeable to pick it up.

## 2014-04-01 ENCOUNTER — Ambulatory Visit
Admission: RE | Admit: 2014-04-01 | Discharge: 2014-04-01 | Disposition: A | Discharge status: Home / Self Care | Payer: BC Managed Care – PPO | Source: Ambulatory Visit | Attending: Radiation Oncology | Admitting: Radiation Oncology

## 2014-04-01 DIAGNOSIS — Z510 Encounter for antineoplastic radiation therapy: Secondary | ICD-10-CM | POA: Diagnosis not present

## 2014-04-02 ENCOUNTER — Ambulatory Visit
Admission: RE | Admit: 2014-04-02 | Discharge: 2014-04-02 | Disposition: A | Discharge status: Home / Self Care | Payer: BC Managed Care – PPO | Source: Ambulatory Visit | Attending: Radiation Oncology | Admitting: Radiation Oncology

## 2014-04-02 DIAGNOSIS — Z510 Encounter for antineoplastic radiation therapy: Secondary | ICD-10-CM | POA: Diagnosis not present

## 2014-04-03 ENCOUNTER — Telehealth: Payer: Self-pay | Admitting: *Deleted

## 2014-04-03 ENCOUNTER — Ambulatory Visit
Admission: RE | Admit: 2014-04-03 | Discharge: 2014-04-03 | Disposition: A | Discharge status: Home / Self Care | Payer: BC Managed Care – PPO | Source: Ambulatory Visit | Attending: Radiation Oncology | Admitting: Radiation Oncology

## 2014-04-03 DIAGNOSIS — Z510 Encounter for antineoplastic radiation therapy: Secondary | ICD-10-CM | POA: Diagnosis not present

## 2014-04-03 NOTE — Telephone Encounter (Signed)
I have called and moved her lab appt on 11/23 to before radiation. Patient aware

## 2014-04-05 ENCOUNTER — Other Ambulatory Visit: Payer: Self-pay | Admitting: Oncology

## 2014-04-05 ENCOUNTER — Ambulatory Visit
Admission: RE | Admit: 2014-04-05 | Discharge: 2014-04-05 | Disposition: A | Discharge status: Home / Self Care | Payer: BC Managed Care – PPO | Source: Ambulatory Visit | Attending: Radiation Oncology | Admitting: Radiation Oncology

## 2014-04-05 DIAGNOSIS — Z510 Encounter for antineoplastic radiation therapy: Secondary | ICD-10-CM | POA: Diagnosis not present

## 2014-04-06 ENCOUNTER — Ambulatory Visit
Admission: RE | Admit: 2014-04-06 | Discharge: 2014-04-06 | Disposition: A | Discharge status: Home / Self Care | Payer: BC Managed Care – PPO | Source: Ambulatory Visit | Attending: Radiation Oncology | Admitting: Radiation Oncology

## 2014-04-06 ENCOUNTER — Encounter: Payer: Self-pay | Admitting: Oncology

## 2014-04-06 ENCOUNTER — Other Ambulatory Visit (HOSPITAL_BASED_OUTPATIENT_CLINIC_OR_DEPARTMENT_OTHER): Payer: BC Managed Care – PPO

## 2014-04-06 ENCOUNTER — Ambulatory Visit: Payer: BC Managed Care – PPO

## 2014-04-06 ENCOUNTER — Ambulatory Visit (HOSPITAL_BASED_OUTPATIENT_CLINIC_OR_DEPARTMENT_OTHER): Payer: BC Managed Care – PPO | Admitting: Oncology

## 2014-04-06 ENCOUNTER — Ambulatory Visit (HOSPITAL_BASED_OUTPATIENT_CLINIC_OR_DEPARTMENT_OTHER): Payer: BC Managed Care – PPO

## 2014-04-06 ENCOUNTER — Encounter: Payer: Self-pay | Admitting: Radiation Oncology

## 2014-04-06 VITALS — BP 109/65 | HR 85 | Temp 98.1°F | Resp 12 | Wt 125.6 lb

## 2014-04-06 DIAGNOSIS — Z510 Encounter for antineoplastic radiation therapy: Secondary | ICD-10-CM | POA: Diagnosis not present

## 2014-04-06 DIAGNOSIS — C519 Malignant neoplasm of vulva, unspecified: Secondary | ICD-10-CM

## 2014-04-06 DIAGNOSIS — Z5111 Encounter for antineoplastic chemotherapy: Secondary | ICD-10-CM

## 2014-04-06 DIAGNOSIS — C3432 Malignant neoplasm of lower lobe, left bronchus or lung: Secondary | ICD-10-CM

## 2014-04-06 LAB — CBC WITH DIFFERENTIAL/PLATELET
BASO%: 0.3 % (ref 0.0–2.0)
Basophils Absolute: 0 10*3/uL (ref 0.0–0.1)
EOS%: 3.1 % (ref 0.0–7.0)
Eosinophils Absolute: 0.1 10*3/uL (ref 0.0–0.5)
HEMATOCRIT: 35 % (ref 34.8–46.6)
HGB: 11.5 g/dL — ABNORMAL LOW (ref 11.6–15.9)
LYMPH%: 10.7 % — ABNORMAL LOW (ref 14.0–49.7)
MCH: 28.8 pg (ref 25.1–34.0)
MCHC: 32.9 g/dL (ref 31.5–36.0)
MCV: 87.7 fL (ref 79.5–101.0)
MONO#: 0.3 10*3/uL (ref 0.1–0.9)
MONO%: 10.4 % (ref 0.0–14.0)
NEUT#: 2.5 10*3/uL (ref 1.5–6.5)
NEUT%: 75.5 % (ref 38.4–76.8)
Platelets: 146 10*3/uL (ref 145–400)
RBC: 3.99 10*6/uL (ref 3.70–5.45)
RDW: 14.4 % (ref 11.2–14.5)
WBC: 3.3 10*3/uL — AB (ref 3.9–10.3)
lymph#: 0.4 10*3/uL — ABNORMAL LOW (ref 0.9–3.3)

## 2014-04-06 LAB — COMPREHENSIVE METABOLIC PANEL (CC13)
ALT: 21 U/L (ref 0–55)
AST: 15 U/L (ref 5–34)
Albumin: 3.7 g/dL (ref 3.5–5.0)
Alkaline Phosphatase: 111 U/L (ref 40–150)
Anion Gap: 11 mEq/L (ref 3–11)
BUN: 8.9 mg/dL (ref 7.0–26.0)
CHLORIDE: 102 meq/L (ref 98–109)
CO2: 21 mEq/L — ABNORMAL LOW (ref 22–29)
CREATININE: 0.7 mg/dL (ref 0.6–1.1)
Calcium: 10 mg/dL (ref 8.4–10.4)
Glucose: 88 mg/dl (ref 70–140)
Potassium: 4 mEq/L (ref 3.5–5.1)
Sodium: 135 mEq/L — ABNORMAL LOW (ref 136–145)
Total Bilirubin: 0.25 mg/dL (ref 0.20–1.20)
Total Protein: 6.9 g/dL (ref 6.4–8.3)

## 2014-04-06 LAB — MAGNESIUM (CC13): Magnesium: 2 mg/dl (ref 1.5–2.5)

## 2014-04-06 MED ORDER — POTASSIUM CHLORIDE 2 MEQ/ML IV SOLN
Freq: Once | INTRAVENOUS | Status: AC
  Administered 2014-04-06: 11:00:00 via INTRAVENOUS
  Filled 2014-04-06: qty 10

## 2014-04-06 MED ORDER — SODIUM CHLORIDE 0.9 % IV SOLN
Freq: Once | INTRAVENOUS | Status: AC
  Administered 2014-04-06: 11:00:00 via INTRAVENOUS

## 2014-04-06 MED ORDER — LORAZEPAM 2 MG/ML IJ SOLN: 0.5000 mg | INTRAMUSCULAR | Status: DC | PRN

## 2014-04-06 MED ORDER — SODIUM CHLORIDE 0.9 % IV SOLN
40.0000 mg/m2 | Freq: Once | INTRAVENOUS | Status: AC
  Administered 2014-04-06: 64 mg via INTRAVENOUS
  Filled 2014-04-06: qty 64

## 2014-04-06 MED ORDER — DEXAMETHASONE SODIUM PHOSPHATE 20 MG/5ML IJ SOLN
12.0000 mg | Freq: Once | INTRAMUSCULAR | Status: AC
  Administered 2014-04-06: 12 mg via INTRAVENOUS

## 2014-04-06 MED ORDER — FOSAPREPITANT DIMEGLUMINE INJECTION 150 MG
150.0000 mg | Freq: Once | INTRAVENOUS | Status: AC
  Administered 2014-04-06: 150 mg via INTRAVENOUS
  Filled 2014-04-06: qty 5

## 2014-04-06 MED ORDER — ONDANSETRON 16 MG/50ML IVPB (CHCC)
INTRAVENOUS | Status: AC
  Filled 2014-04-06: qty 16

## 2014-04-06 MED ORDER — DEXAMETHASONE SODIUM PHOSPHATE 20 MG/5ML IJ SOLN
INTRAMUSCULAR | Status: AC
  Filled 2014-04-06: qty 5

## 2014-04-06 MED ORDER — ONDANSETRON 16 MG/50ML IVPB (CHCC)
16.0000 mg | Freq: Once | INTRAVENOUS | Status: AC
  Administered 2014-04-06: 16 mg via INTRAVENOUS

## 2014-04-06 NOTE — Progress Notes (Signed)
[facility] End of Treatment Note  Name:Jody Beard  Date: 03/04/2014 DIY:[id number] DOB:07-09-1950   Status:outpatient     DIAGNOSIS: Stage IB [id number] Left lower lung squamous cell carcinoma    INDICATION FOR TREATMENT: Curative   TREATMENT DATES:  10/16, 10/19 and 03/04/14                          SITE/DOSE:   Left lower lung tumor / 54Gy in 3 fractions                         BEAMS/ENERGY:     SBRT technique / 6MV FFF photons              NARRATIVE:       She tolerated SBRT to her lung well.                     PLAN: Proceed with ChRT for vulvar cancer in 5 days.  -----------------------------------  [name], MD

## 2014-04-06 NOTE — Progress Notes (Signed)
She is currently in no pain. Pt complains of, Fatigue and Generalized Weakness, reports feeling "dizzy" occasionally.  Reports no abnormal urinary symptoms. Denies having vaginal discharge.  Reports vaginally discomfort has improved greatly and her only complaint is pubic pruritis which she claims is due to the hair loss.  She started taking Benedryl 25mg  as needed on Thursday 04/02/14. Dr Marko Plume started her on acyclovir on 03/31/14 due to slight vaginal redness.  Pt reports Diarrhea  2 times a week, reports continued use of Imodium as needed.

## 2014-04-06 NOTE — Progress Notes (Signed)
   Weekly Management Note:  outpatient    ICD-9-CM ICD-10-CM   1. Primary vulvar cancer 184.4 C51.9     Current Dose:  44 Gy  Projected Dose: 60 Gy   Narrative:  The patient presents for routine under treatment assessment.  CBCT/MVCT images/Port film x-rays were reviewed.  The chart was checked. She is doing relatively well.  Vulva Irritation decreased. Occasional diarrhea, taking Imodium.    Physical Findings:  weight is 125 lb 9.6 oz (56.972 kg). Her oral temperature is 98.1 F (36.7 C). Her blood pressure is 109/65 and her pulse is 85. Her respiration is 12 and oxygen saturation is 97%.  vulva - swollen, but marked tumor regression.  Moist desquamation in patches.  Whitehead over right pubic hair is gone.   CBC    Component Value Date/Time   WBC 3.3* 04/06/2014 0842   WBC 7.4 02/23/2014 0730   RBC 3.99 04/06/2014 0842   RBC 4.33 02/23/2014 0730   HGB 11.5* 04/06/2014 0842   HGB 12.5 02/23/2014 0730   HCT 35.0 04/06/2014 0842   HCT 38.2 02/23/2014 0730   PLT 146 04/06/2014 0842   PLT 203 02/23/2014 0730   MCV 87.7 04/06/2014 0842   MCV 88.2 02/23/2014 0730   MCH 28.8 04/06/2014 0842   MCH 28.9 02/23/2014 0730   MCHC 32.9 04/06/2014 0842   MCHC 32.7 02/23/2014 0730   RDW 14.4 04/06/2014 0842   RDW 14.1 02/23/2014 0730   LYMPHSABS 0.4* 04/06/2014 0842   LYMPHSABS 1.1 02/23/2014 0730   MONOABS 0.3 04/06/2014 0842   MONOABS 0.9 02/23/2014 0730   EOSABS 0.1 04/06/2014 0842   EOSABS 0.2 02/23/2014 0730   BASOSABS 0.0 04/06/2014 0842   BASOSABS 0.0 02/23/2014 0730     CMP     Component Value Date/Time   NA 135* 04/06/2014 0842   NA 140 02/06/2014 1110   K 4.0 04/06/2014 0842   K 4.9 02/06/2014 1110   CL 101 02/06/2014 1110   CO2 21* 04/06/2014 0842   CO2 26 02/06/2014 1110   GLUCOSE 88 04/06/2014 0842   GLUCOSE 105* 02/06/2014 1110   BUN 8.9 04/06/2014 0842   BUN 8 02/06/2014 1110   CREATININE 0.7 04/06/2014 0842   CREATININE 0.72 02/06/2014 1110   CALCIUM  10.0 04/06/2014 0842   CALCIUM 10.3 02/06/2014 1110   PROT 6.9 04/06/2014 0842   PROT 8.2 02/06/2014 1110   ALBUMIN 3.7 04/06/2014 0842   ALBUMIN 4.3 02/06/2014 1110   AST 15 04/06/2014 0842   AST 15 02/06/2014 1110   ALT 21 04/06/2014 0842   ALT 11 02/06/2014 1110   ALKPHOS 111 04/06/2014 0842   ALKPHOS 97 02/06/2014 1110   BILITOT 0.25 04/06/2014 0842   BILITOT 0.3 02/06/2014 1110   GFRNONAA >90 02/06/2014 1110   GFRAA >90 02/06/2014 1110     Impression:  The patient is tolerating radiotherapy.   Plan:  Continue radiotherapy as planned.    -----------------------------------  [name], MD

## 2014-04-06 NOTE — Patient Instructions (Signed)
[facility] Discharge Instructions for Patients Receiving Chemotherapy  Today you received the following chemotherapy agents: Cisplatin.  To help prevent nausea and vomiting after your treatment, we encourage you to take your nausea medication as prescribed.  412} If you develop nausea and vomiting that is not controlled by your nausea medication, call the clinic.   BELOW ARE SYMPTOMS THAT SHOULD BE REPORTED IMMEDIATELY:  *FEVER GREATER THAN 100.5 F  *CHILLS WITH OR WITHOUT FEVER  NAUSEA AND VOMITING THAT IS NOT CONTROLLED WITH YOUR NAUSEA MEDICATION  *UNUSUAL SHORTNESS OF BREATH  *UNUSUAL BRUISING OR BLEEDING  TENDERNESS IN MOUTH AND THROAT WITH OR WITHOUT PRESENCE OF ULCERS  *URINARY PROBLEMS  *BOWEL PROBLEMS  UNUSUAL RASH Items with * indicate a potential emergency and should be followed up as soon as possible.  Feel free to call the clinic you have any questions or concerns. The clinic phone number is [phone].

## 2014-04-06 NOTE — Progress Notes (Signed)
OFFICE PROGRESS NOTE   04/06/2014   Physicians:Emma Rossi/ W.Brewster ,Rosalio Loud  INTERVAL HISTORY:  Patient is seen in bedroom in infusion area, receiving cycle 5 weekly sensitizing CDDP with radiation in progress for advanced vulvar carcinoma; she has also had SBRT to dominant LLL pulmonary mass.   Patient notes progressive improvement in vulvar area, now able to sit much more easily. She is using sitz baths and lidocaine gel mixed with silvadene as instructed. She had diarrhea on 11-18 and 11-19, improved with <=3 imodium in 24 hours. Appetite has been better since she has been more comfortable otherwise. She denies increased SOB or cough. She slept well last pm.   She does not have PAC Flu vaccine done  ONCOLOGIC HISTORY Patient had no regular medical care at least since moving to [place] several years ago. She had gradually enlarging mass at right vulva for ~ 8 months, uncomfortable with direct pressure and ulcerated with occasional bleeding. She was seen in ED on 02-02-14 with right inguinal adenopathy and right vulvar masses, then seen by Dr Elonda Husky on 02-03-14, clinically with vulvar carcinoma. She was then seen in consultation by Dr Skeet Latch on 02-05-14, with biopsy of both vulvar mass and satellite lesions on inner thigh and mons. Her exam found 7 cm right vulvar mass with 4 cm extension to distal vagina, extending anteriorly to urethral meatus and in ischiorectal area, with satellite lesions inner thigh and right mons. Pathology [phone]) showed invasive squamous cell carcinoma arising in background of high grade squamous intraepithelial lesion from vulva biopsy and invasive moderately differentiated squamous cell carcinoma from inner thigh lesion. CXR 02-06-14 showed left lower lobe lung mass 3.6 x 3.6 x 3.6 cm, with no adenopathy and no bony lesions, emphysematous changes. CT chest 02-09-14 showed the lower lobe mass 3.7 x 3.5 x 3.3 cm as well as 3 mm RLL nodule and faint 4 mm LUL  nodule, no mediastinal or hilar nodes, no axillary or supraclavicular nodes, and upper abdomen not remarkable. PET 02-18-14 measured LLL lung mass 4.6 x 3.9 cm, hypermetabolic, and noted multiple subcentimeter nodules scattered thruout lungs bilaterally; the right vulvar mass was also hypermetabolic, extending up into right side of lower vagina, extensive R>L and right external iliac adenopathy, no uptake in skeleton. 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 After initial consideration of bilateral inguinal lymphadenectomy, plan subsequently changed to radiation with sensitizing CDDP. She had 3 stereotactic body radiation treatments to the LLL pulmonary mass in 02-2014. She began IMRT to vulvar area on 03-09-14, planned thru 04-24-14, with weekly CDDP as radiation sensitizer, first CDDP 03-09-14  Review of systems as above, also: Itching in pubic area with hair loss there, better with benadryl at hs. No significant bleeding. Is able to do oral prehydration as instructed. Had difficult time pushing po fluids with diarrhea and will try antiemetic if that happens again. No hair loss Remainder of 10 point Review of Systems negative.  Objective:  Vital signs in last 24 hours:  Vitals per infusion flow sheet wgt 125 lb  9 oz, BP 109/65, HR 85 reg, resp 18 not labored supine, 98.1, O2 sat 97% Alert, oriented and appropriate. Appears comfortable supine on RA in bed. No alopecia  HEENT:PERRL, sclerae not icteric. Oral mucosa moist without lesions, posterior pharynx clear.  Neck supple. No JVD.  Lymphatics:no cervical,suraclavicular adenopathy. RIght inguinal node seems less prominent, ~ 1.5 x 2 cm, hard, not tender. Resp:  clear to auscultation anteriorly Cardio: regular rate and rhythm. No gallop. GI: soft, nontender, not distended, no mass or organomegaly. Normally active bowel sounds.  Musculoskeletal/ Extremities: without pitting  edema, cords, tenderness Neuro: no peripheral neuropathy. Otherwise nonfocal Skin without rash, ecchymosis, petechiae. Peripheral IV distal right arm site ok.   Lab Results:  Results for orders placed or performed in visit on 04/06/14  CBC with Differential  Result Value Ref Range   WBC 3.3 (L) 3.9 - 10.3 10e3/uL   NEUT# 2.5 1.5 - 6.5 10e3/uL   HGB 11.5 (L) 11.6 - 15.9 g/dL   HCT 35.0 34.8 - 46.6 %   Platelets 146 145 - 400 10e3/uL   MCV 87.7 79.5 - 101.0 fL   MCH 28.8 25.1 - 34.0 pg   MCHC 32.9 31.5 - 36.0 g/dL   RBC 3.99 3.70 - 5.45 10e6/uL   RDW 14.4 11.2 - 14.5 %   lymph# 0.4 (L) 0.9 - 3.3 10e3/uL   MONO# 0.3 0.1 - 0.9 10e3/uL   Eosinophils Absolute 0.1 0.0 - 0.5 10e3/uL   Basophils Absolute 0.0 0.0 - 0.1 10e3/uL   NEUT% 75.5 38.4 - 76.8 %   LYMPH% 10.7 (L) 14.0 - 49.7 %   MONO% 10.4 0.0 - 14.0 %   EOS% 3.1 0.0 - 7.0 %   BASO% 0.3 0.0 - 2.0 %  Comprehensive metabolic panel (Cmet) - [facility]  Result Value Ref Range   Sodium 135 (L) 136 - 145 mEq/L   Potassium 4.0 3.5 - 5.1 mEq/L   Chloride 102 98 - 109 mEq/L   CO2 21 (L) 22 - 29 mEq/L   Glucose 88 70 - 140 mg/dl   BUN 8.9 7.0 - 26.0 mg/dL   Creatinine 0.7 0.6 - 1.1 mg/dL   Total Bilirubin 0.25 0.20 - 1.20 mg/dL   Alkaline Phosphatase 111 40 - 150 U/L   AST 15 5 - 34 U/L   ALT 21 0 - 55 U/L   Total Protein 6.9 6.4 - 8.3 g/dL   Albumin 3.7 3.5 - 5.0 g/dL   Calcium 10.0 8.4 - 10.4 mg/dL   Anion Gap 11 3 - 11 mEq/L  Magnesium  Result Value Ref Range   Magnesium 2.0 1.5 - 2.5 mg/dl    Labs reviewed with patient, written copies provided. Studies/Results:  No results found.  Medications: I have reviewed the patient's current medications. Fine to continue benadryl 25 mg at hs prn sleep. Reviewed use of imodium. Try antiemetic if difficulty taking po fluids, in case this is more nausea related than she can tell.   DISCUSSION: will decide closer to time if she will have CDDP also on 04-20-14, as last RT is about that day,  tho if she continues to tolerate chemo very well would be reasonable to give.   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 Symptoms are much better with treatment in progress. I will see her back on 04-13-14 with chemo that day.  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 Also multiple bilateral subcentimeter pulmonary nodules concerning for metastatic disease. SBRT to the LLL mass 10-16, 19 and 21-2015. Follow pulmonary symptoms closely as other areas are treated, reimage at least after completion of RT to vulvar area. 3.long past tobacco  4. BP runs somewhat low, not on antihypertensives. Continue to push  po fluids and follow 5.history migraine HAs. No HA with zofran.  6.allergy PCN  7.BTL  8.has been given information for Advance Directives 9.flu vaccine given    All questions answered and patient is in agreement with plans. Chemo orders confirmed. She knows to call prior to next scheduled visit if needed   Gordy Levan, MD   04/06/2014, 1:34 PM

## 2014-04-07 ENCOUNTER — Ambulatory Visit
Admission: RE | Admit: 2014-04-07 | Discharge: 2014-04-07 | Disposition: A | Discharge status: Home / Self Care | Payer: BC Managed Care – PPO | Source: Ambulatory Visit | Attending: Radiation Oncology | Admitting: Radiation Oncology

## 2014-04-07 DIAGNOSIS — Z510 Encounter for antineoplastic radiation therapy: Secondary | ICD-10-CM | POA: Diagnosis not present

## 2014-04-08 ENCOUNTER — Ambulatory Visit
Admission: RE | Admit: 2014-04-08 | Discharge: 2014-04-08 | Disposition: A | Discharge status: Home / Self Care | Payer: BC Managed Care – PPO | Source: Ambulatory Visit | Attending: Radiation Oncology | Admitting: Radiation Oncology

## 2014-04-08 DIAGNOSIS — Z510 Encounter for antineoplastic radiation therapy: Secondary | ICD-10-CM | POA: Diagnosis not present

## 2014-04-10 ENCOUNTER — Other Ambulatory Visit: Payer: Self-pay | Admitting: Oncology

## 2014-04-10 ENCOUNTER — Ambulatory Visit: Payer: BC Managed Care – PPO

## 2014-04-13 ENCOUNTER — Ambulatory Visit
Admission: RE | Admit: 2014-04-13 | Discharge: 2014-04-13 | Disposition: A | Discharge status: Home / Self Care | Payer: BC Managed Care – PPO | Source: Ambulatory Visit | Attending: Radiation Oncology | Admitting: Radiation Oncology

## 2014-04-13 ENCOUNTER — Ambulatory Visit (HOSPITAL_BASED_OUTPATIENT_CLINIC_OR_DEPARTMENT_OTHER): Payer: BC Managed Care – PPO | Admitting: Oncology

## 2014-04-13 ENCOUNTER — Encounter: Payer: Self-pay | Admitting: Oncology

## 2014-04-13 ENCOUNTER — Ambulatory Visit: Payer: BC Managed Care – PPO | Admitting: Nutrition

## 2014-04-13 ENCOUNTER — Other Ambulatory Visit: Payer: Self-pay | Admitting: *Deleted

## 2014-04-13 ENCOUNTER — Encounter: Payer: Self-pay | Admitting: Radiation Oncology

## 2014-04-13 ENCOUNTER — Other Ambulatory Visit: Payer: BC Managed Care – PPO

## 2014-04-13 ENCOUNTER — Ambulatory Visit: Payer: BC Managed Care – PPO

## 2014-04-13 ENCOUNTER — Ambulatory Visit (HOSPITAL_BASED_OUTPATIENT_CLINIC_OR_DEPARTMENT_OTHER): Payer: BC Managed Care – PPO

## 2014-04-13 ENCOUNTER — Other Ambulatory Visit (HOSPITAL_BASED_OUTPATIENT_CLINIC_OR_DEPARTMENT_OTHER): Payer: BC Managed Care – PPO

## 2014-04-13 VITALS — BP 92/66 | HR 88 | Temp 98.2°F | Resp 10 | Wt 122.5 lb

## 2014-04-13 VITALS — BP 104/64 | HR 75 | Temp 98.0°F | Resp 18 | Ht 61.0 in | Wt 123.6 lb

## 2014-04-13 DIAGNOSIS — C792 Secondary malignant neoplasm of skin: Secondary | ICD-10-CM

## 2014-04-13 DIAGNOSIS — Z5111 Encounter for antineoplastic chemotherapy: Secondary | ICD-10-CM

## 2014-04-13 DIAGNOSIS — C519 Malignant neoplasm of vulva, unspecified: Secondary | ICD-10-CM

## 2014-04-13 DIAGNOSIS — C778 Secondary and unspecified malignant neoplasm of lymph nodes of multiple regions: Secondary | ICD-10-CM

## 2014-04-13 DIAGNOSIS — C3432 Malignant neoplasm of lower lobe, left bronchus or lung: Secondary | ICD-10-CM

## 2014-04-13 DIAGNOSIS — C7982 Secondary malignant neoplasm of genital organs: Secondary | ICD-10-CM

## 2014-04-13 DIAGNOSIS — Z87891 Personal history of nicotine dependence: Secondary | ICD-10-CM

## 2014-04-13 DIAGNOSIS — C774 Secondary and unspecified malignant neoplasm of inguinal and lower limb lymph nodes: Secondary | ICD-10-CM

## 2014-04-13 DIAGNOSIS — Z510 Encounter for antineoplastic radiation therapy: Secondary | ICD-10-CM | POA: Diagnosis not present

## 2014-04-13 LAB — COMPREHENSIVE METABOLIC PANEL (CC13)
ALT: 17 U/L (ref 0–55)
ANION GAP: 13 meq/L — AB (ref 3–11)
AST: 15 U/L (ref 5–34)
Albumin: 3.6 g/dL (ref 3.5–5.0)
Alkaline Phosphatase: 112 U/L (ref 40–150)
BILIRUBIN TOTAL: 0.36 mg/dL (ref 0.20–1.20)
BUN: 9.4 mg/dL (ref 7.0–26.0)
CO2: 23 meq/L (ref 22–29)
Calcium: 9.9 mg/dL (ref 8.4–10.4)
Chloride: 98 mEq/L (ref 98–109)
Creatinine: 0.8 mg/dL (ref 0.6–1.1)
GLUCOSE: 109 mg/dL (ref 70–140)
Potassium: 3.8 mEq/L (ref 3.5–5.1)
Sodium: 134 mEq/L — ABNORMAL LOW (ref 136–145)
Total Protein: 7 g/dL (ref 6.4–8.3)

## 2014-04-13 LAB — CBC WITH DIFFERENTIAL/PLATELET
BASO%: 0.9 % (ref 0.0–2.0)
Basophils Absolute: 0 10*3/uL (ref 0.0–0.1)
EOS ABS: 0.1 10*3/uL (ref 0.0–0.5)
EOS%: 2.2 % (ref 0.0–7.0)
HEMATOCRIT: 34.1 % — AB (ref 34.8–46.6)
HGB: 11.2 g/dL — ABNORMAL LOW (ref 11.6–15.9)
LYMPH%: 5.5 % — AB (ref 14.0–49.7)
MCH: 28.6 pg (ref 25.1–34.0)
MCHC: 32.9 g/dL (ref 31.5–36.0)
MCV: 86.8 fL (ref 79.5–101.0)
MONO#: 0.5 10*3/uL (ref 0.1–0.9)
MONO%: 12 % (ref 0.0–14.0)
NEUT%: 79.4 % — AB (ref 38.4–76.8)
NEUTROS ABS: 3.5 10*3/uL (ref 1.5–6.5)
PLATELETS: 146 10*3/uL (ref 145–400)
RBC: 3.93 10*6/uL (ref 3.70–5.45)
RDW: 14.3 % (ref 11.2–14.5)
WBC: 4.4 10*3/uL (ref 3.9–10.3)
lymph#: 0.2 10*3/uL — ABNORMAL LOW (ref 0.9–3.3)

## 2014-04-13 LAB — MAGNESIUM (CC13): MAGNESIUM: 2.1 mg/dL (ref 1.5–2.5)

## 2014-04-13 MED ORDER — SODIUM CHLORIDE 0.9 % IV SOLN
Freq: Once | INTRAVENOUS | Status: AC
  Administered 2014-04-13: 10:00:00 via INTRAVENOUS

## 2014-04-13 MED ORDER — DEXAMETHASONE SODIUM PHOSPHATE 20 MG/5ML IJ SOLN
INTRAMUSCULAR | Status: AC
  Filled 2014-04-13: qty 5

## 2014-04-13 MED ORDER — SODIUM CHLORIDE 0.9 % IV SOLN
150.0000 mg | Freq: Once | INTRAVENOUS | Status: AC
  Administered 2014-04-13: 150 mg via INTRAVENOUS
  Filled 2014-04-13: qty 5

## 2014-04-13 MED ORDER — ONDANSETRON 16 MG/50ML IVPB (CHCC)
16.0000 mg | Freq: Once | INTRAVENOUS | Status: AC
  Administered 2014-04-13: 16 mg via INTRAVENOUS

## 2014-04-13 MED ORDER — POTASSIUM CHLORIDE 2 MEQ/ML IV SOLN
Freq: Once | INTRAVENOUS | Status: AC
  Administered 2014-04-13: 11:00:00 via INTRAVENOUS
  Filled 2014-04-13: qty 10

## 2014-04-13 MED ORDER — TRAMADOL HCL 50 MG PO TABS: 50.0000 mg | ORAL_TABLET | Freq: Three times a day (TID) | ORAL | Status: DC | PRN

## 2014-04-13 MED ORDER — CISPLATIN CHEMO INJECTION 100MG/100ML
40.0000 mg/m2 | Freq: Once | INTRAVENOUS | Status: AC
  Administered 2014-04-13: 64 mg via INTRAVENOUS
  Filled 2014-04-13: qty 64

## 2014-04-13 MED ORDER — ONDANSETRON 16 MG/50ML IVPB (CHCC)
INTRAVENOUS | Status: AC
  Filled 2014-04-13: qty 16

## 2014-04-13 MED ORDER — DEXAMETHASONE SODIUM PHOSPHATE 20 MG/5ML IJ SOLN
12.0000 mg | Freq: Once | INTRAMUSCULAR | Status: AC
  Administered 2014-04-13: 12 mg via INTRAVENOUS

## 2014-04-13 NOTE — Progress Notes (Signed)
   Weekly Management Note:  outpatient    ICD-9-CM ICD-10-CM   1. Primary vulvar cancer 184.4 C51.9     Current Dose:  50 Gy  Projected Dose: 66Gy   Narrative:  The patient presents for routine under treatment assessment.  CBCT/MVCT images/Port film x-rays were reviewed.  The chart was checked. She is doing relatively well.  Vulva Irritation tolerable.  Dry in areas, moist in other areas over perineum.  Physical Findings:  weight is 122 lb 8 oz (55.566 kg). Her oral temperature is 98.2 F (36.8 C). Her blood pressure is 92/66 and her pulse is 88. Her respiration is 10 and oxygen saturation is 100%.  vulva - swollen, but continued marked tumor regression. I still see tumor at least in posterior right vulva.  Moist desquamation in patches.  Dryness and erythema diffusely.  CBC    Component Value Date/Time   WBC 4.4 04/13/2014 0747   WBC 7.4 02/23/2014 0730   RBC 3.93 04/13/2014 0747   RBC 4.33 02/23/2014 0730   HGB 11.2* 04/13/2014 0747   HGB 12.5 02/23/2014 0730   HCT 34.1* 04/13/2014 0747   HCT 38.2 02/23/2014 0730   PLT 146 04/13/2014 0747   PLT 203 02/23/2014 0730   MCV 86.8 04/13/2014 0747   MCV 88.2 02/23/2014 0730   MCH 28.6 04/13/2014 0747   MCH 28.9 02/23/2014 0730   MCHC 32.9 04/13/2014 0747   MCHC 32.7 02/23/2014 0730   RDW 14.3 04/13/2014 0747   RDW 14.1 02/23/2014 0730   LYMPHSABS 0.2* 04/13/2014 0747   LYMPHSABS 1.1 02/23/2014 0730   MONOABS 0.5 04/13/2014 0747   MONOABS 0.9 02/23/2014 0730   EOSABS 0.1 04/13/2014 0747   EOSABS 0.2 02/23/2014 0730   BASOSABS 0.0 04/13/2014 0747   BASOSABS 0.0 02/23/2014 0730     CMP     Component Value Date/Time   NA 134* 04/13/2014 0747   NA 140 02/06/2014 1110   K 3.8 04/13/2014 0747   K 4.9 02/06/2014 1110   CL 101 02/06/2014 1110   CO2 23 04/13/2014 0747   CO2 26 02/06/2014 1110   GLUCOSE 109 04/13/2014 0747   GLUCOSE 105* 02/06/2014 1110   BUN 9.4 04/13/2014 0747   BUN 8 02/06/2014 1110   CREATININE 0.8  04/13/2014 0747   CREATININE 0.72 02/06/2014 1110   CALCIUM 9.9 04/13/2014 0747   CALCIUM 10.3 02/06/2014 1110   PROT 7.0 04/13/2014 0747   PROT 8.2 02/06/2014 1110   ALBUMIN 3.6 04/13/2014 0747   ALBUMIN 4.3 02/06/2014 1110   AST 15 04/13/2014 0747   AST 15 02/06/2014 1110   ALT 17 04/13/2014 0747   ALT 11 02/06/2014 1110   ALKPHOS 112 04/13/2014 0747   ALKPHOS 97 02/06/2014 1110   BILITOT 0.36 04/13/2014 0747   BILITOT 0.3 02/06/2014 1110   GFRNONAA >90 02/06/2014 1110   GFRAA >90 02/06/2014 1110     Impression:  The patient is tolerating radiotherapy.   Plan:  Continue radiotherapy as planned, but add 3 fraction boost of 6 Gy to gross disease. I discussed this plan with Dr Denman George who concurs. Our goal is locoregional control and hopefully sparing her from morbidity of surgery.  -----------------------------------  [name], MD

## 2014-04-13 NOTE — Progress Notes (Signed)
She rates her pain as a 5 on a scale of 0-10. Pt complains of skin sensitiveness and itching, Fatigue, Generalized Weakness, Pain  Burning and Pain Occurs  Constantly. Pt reports she is taking Tramadol, once a day. Denies urinary changes.  Pt reports vaginal symptoms of burning and itching.  Reports some bright red spotting when wiping. Encouraged to use baby wipes. Pt reports Nausea and Diarrhea 1 time a day. Continues to take Zofran.

## 2014-04-13 NOTE — Progress Notes (Signed)
Nutrition followup completed with patient. Patient reports she had a "bad day" this past Friday and over the weekend.   Her nausea and diarrhea have resolved. Last bowel movement was Saturday. Weight documented as 122.5 pounds November 30.  Decreased from 126.4 pounds November 16.  Nutrition diagnosis: Food and nutrition related knowledge deficit improved.  Intervention: Patient educated to continue strategies for high-calorie, high-protein meals and snacks. Recommend oral nutrition supplements between meals. Teach back method used.  Monitoring, evaluation, goals: Patient has had weight loss secondary to nausea and diarrhea.  Patient will work to increase oral intake to minimize further decline in weight.  Next visit:Monday, December 7, during chemotherapy.  **Disclaimer: This note was dictated with voice recognition software. Similar sounding words can inadvertently be transcribed and this note may contain transcription errors which may not have been corrected upon publication of note.**

## 2014-04-13 NOTE — Patient Instructions (Signed)
Take tramadol (ultram) 50 mg every 8 hours.  Take imodium 2 tablets up to 4 times daily for diarrhea. Call if not sufficient  Try lidocaine get (xylocaine) directly instead of mixing with silvadene  Try baby wipes instead of toilet paper

## 2014-04-13 NOTE — Progress Notes (Signed)
OFFICE PROGRESS NOTE   04/13/2014   Physicians:Emma Rossi/ W.Brewster ,Rosalio Loud  INTERVAL HISTORY:   Patient is seen, alone for visit, in continuing attention to advanced vulvar squamous cell carcinoma, which may also be metastatic to lung vs second primary lung cancer. She continues radiation by Dr Isidore Moos and is for cycle 6 weekly sensitizing CDDP today.   She has had more local discomfort since she was seen last week, and had difficult time taking po's on 04-10-14. She has used lidocaine + silvadene mixed, feels this may be too "drying", with significant itching in vulvar area. She uses sitz baths regularly, has been taking tramadol only 50 mg at hs which does help a little, is patting dry with toilet paper, has had some local bleeding when uses the toilet paper. She did not have frank nausea, but took little po on 11-27 and felt light-headed then; she tried antiemetic as suggested, which seemed somewhat helpful, and was able to eat and drink last 2 days. Notices metallic taste. She has had loose stools, no longer frankly watery using imodium ~ 3 tablets in 24 hours. (see med instructions below).   ONCOLOGIC HISTORY Patient had no regular medical care at least since moving to [place] several years ago. She had gradually enlarging mass at right vulva for ~ 8 months, uncomfortable with direct pressure and ulcerated with occasional bleeding. She was seen in ED on 02-02-14 with right inguinal adenopathy and right vulvar masses, then seen by Dr Elonda Husky on 02-03-14, clinically with vulvar carcinoma. She was then seen in consultation by Dr Skeet Latch on 02-05-14, with biopsy of both vulvar mass and satellite lesions on inner thigh and mons. Her exam found 7 cm right vulvar mass with 4 cm extension to distal vagina, extending anteriorly to urethral meatus and in ischiorectal area, with satellite lesions inner thigh and right mons. Pathology [phone]) showed invasive squamous cell carcinoma arising in  background of high grade squamous intraepithelial lesion from vulva biopsy and invasive moderately differentiated squamous cell carcinoma from inner thigh lesion. CXR 02-06-14 showed left lower lobe lung mass 3.6 x 3.6 x 3.6 cm, with no adenopathy and no bony lesions, emphysematous changes. CT chest 02-09-14 showed the lower lobe mass 3.7 x 3.5 x 3.3 cm as well as 3 mm RLL nodule and faint 4 mm LUL nodule, no mediastinal or hilar nodes, no axillary or supraclavicular nodes, and upper abdomen not remarkable. PET 02-18-14 measured LLL lung mass 4.6 x 3.9 cm, hypermetabolic, and noted multiple subcentimeter nodules scattered thruout lungs bilaterally; the right vulvar mass was also hypermetabolic, extending up into right side of lower vagina, extensive R>L and right external iliac adenopathy, no uptake in skeleton. 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 After initial consideration of bilateral inguinal lymphadenectomy, plan subsequently changed to radiation with sensitizing CDDP. She had 3 stereotactic body radiation treatments to the LLL pulmonary mass in 02-2014. She began IMRT to vulvar area on 03-09-14, planned thru 04-24-14, with weekly CDDP as radiation sensitizer, first CDDP 03-09-14   Review of systems as above, also: No other itching, no other bleeding. No significant cough, no wheezing. Bladder ok. No LE swelling. 2 sticks for blood draw today, peripheral IVs have been adequately accomplished. Remainder of 10 point Review of Systems negative.  Objective:  Vital signs in last 24 hours:  BP 104/64 mmHg  Pulse 75  Temp(Src) 98 F (36.7 C) (Oral)  Resp 18  Ht 5\' 1"  (1.549 m)  Wt 123 lb 9.6 oz (56.065 kg)  BMI 23.37 kg/m2 Weight down 2 lbs Alert, oriented and appropriate. Ambulatory without assistance, unable to sit flat.  No alopecia  HEENT:PERRL, sclerae not icteric. Oral mucosa fairly moist, without lesions, posterior  pharynx clear.  Neck supple. No JVD.  Lymphatics:no cervical,supraclavicular, left inguinal adenopathy. Right inguinal node still easily palpable tho slightly smaller that prior to RT. Resp: diminished BS thruout otherwise clear to auscultation bilaterally and no dullness to percussion bilaterally Cardio: regular rate and rhythm. No gallop. GI: soft, nontender, not distended, no mass or organomegaly. Normally active bowel sounds.  Musculoskeletal/ Extremities: without pitting edema, cords, tenderness Neuro: no peripheral neuropathy. Otherwise nonfocal. PSYCH appropriate mood and affect. Skin erythema without desquamation inguinal areas and mons. Superficial ulcerations over posterior aspect of vulvar involvement, no active bleeding.  Size of vulvar mass much improved even from my last exam.   Lab Results:  Results for orders placed or performed in visit on 04/13/14  CBC with Differential  Result Value Ref Range   WBC 4.4 3.9 - 10.3 10e3/uL   NEUT# 3.5 1.5 - 6.5 10e3/uL   HGB 11.2 (L) 11.6 - 15.9 g/dL   HCT 34.1 (L) 34.8 - 46.6 %   Platelets 146 145 - 400 10e3/uL   MCV 86.8 79.5 - 101.0 fL   MCH 28.6 25.1 - 34.0 pg   MCHC 32.9 31.5 - 36.0 g/dL   RBC 3.93 3.70 - 5.45 10e6/uL   RDW 14.3 11.2 - 14.5 %   lymph# 0.2 (L) 0.9 - 3.3 10e3/uL   MONO# 0.5 0.1 - 0.9 10e3/uL   Eosinophils Absolute 0.1 0.0 - 0.5 10e3/uL   Basophils Absolute 0.0 0.0 - 0.1 10e3/uL   NEUT% 79.4 (H) 38.4 - 76.8 %   LYMPH% 5.5 (L) 14.0 - 49.7 %   MONO% 12.0 0.0 - 14.0 %   EOS% 2.2 0.0 - 7.0 %   BASO% 0.9 0.0 - 2.0 %    CMET has Na 134, K 3.8, T prot 7.0 and otherwise WNL Mg 2.1  Studies/Results:  No results found.  Medications: I have reviewed the patient's current medications. Written and oral instructions:  take tramadol (ultram) 50 mg every 8 hours.  Take imodium 2 tablets up to 4 times daily for diarrhea. Call if not sufficient  Try lidocaine get (xylocaine) directly instead of mixing with  silvadene Use antiemetic if unable to tolerate po's   DISCUSSION: meds as above. Expect will give CDDP also on 04-20-14, which is last planned RT, as long as tolerating treatment then. Will get CT chest ~ mid Dec, then expect restaging following RT to be at least 4 weeks after RT completes. Try baby wipes instead of toilet paper  Communication from Dr Isidore Moos following visit, to this MD and  Gyn onc: as patient is tolerating treatment well and having significant improvement, Dr Isidore Moos is adding 3 fractions to boost gross disease to 66 Gy, as discussed with Dr Denman George, in hopes of avoiding surgery. Dr Marko Plume will order PET at least 4 weeks after treatment, and patient should see Dr Denman George after PET.    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 RT extended by 3 fractions; if stable, will give cycle 7 weekly CDDP on 12-7 with the additional RT. I will get CT chest in 2-3 weeks and see  her after that scan; she will need PET ~ 4 weeks after completion of RT and follow up with Dr Denman George then. Medication adjustments as above. 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 Also multiple bilateral subcentimeter pulmonary nodules concerning for metastatic disease, lung primary or vulvar. SBRT to the LLL mass 10-16, 19 and 21-2015. Continuing to follow pulmonary status as other areas are treated, reimage after completion of vulvar area RT as above. 3.long past tobacco  4. BP runs somewhat low, not on antihypertensives. Continue to push po fluids and follow. She may need additional IVF end of this week and possibly next week, RN to follow up with her by phone and set this up if so. 5.history migraine HAs. No HA with zofran.  6.allergy PCN  7.BTL  8.has been given information for Advance Directives 9.flu  vaccine given    Chemo orders confirmed. All questions answered. Time spent 30 min including >50% counseling and coordination of care.  Nikkolas Coomes P, MD   04/13/2014, 8:25 AM

## 2014-04-13 NOTE — Progress Notes (Signed)
Pre-cisplatin UOP: 450 cc Post UOP: 850 Patient received approx 700 cc NS in addition to cisplatin prehydration IVF; patient feels well hydrated and is able to take po liquids.

## 2014-04-13 NOTE — Patient Instructions (Signed)
[facility] Discharge Instructions for Patients Receiving Chemotherapy  Today you received the following chemotherapy agents Cisplatin.  To help prevent nausea and vomiting after your treatment, we encourage you to take your nausea medication as prescribed.   If you develop nausea and vomiting that is not controlled by your nausea medication, call the clinic.   BELOW ARE SYMPTOMS THAT SHOULD BE REPORTED IMMEDIATELY:  *FEVER GREATER THAN 100.5 F  *CHILLS WITH OR WITHOUT FEVER  NAUSEA AND VOMITING THAT IS NOT CONTROLLED WITH YOUR NAUSEA MEDICATION  *UNUSUAL SHORTNESS OF BREATH  *UNUSUAL BRUISING OR BLEEDING  TENDERNESS IN MOUTH AND THROAT WITH OR WITHOUT PRESENCE OF ULCERS  *URINARY PROBLEMS  *BOWEL PROBLEMS  UNUSUAL RASH Items with * indicate a potential emergency and should be followed up as soon as possible.  Feel free to call the clinic you have any questions or concerns. The clinic phone number is [phone].    

## 2014-04-14 ENCOUNTER — Ambulatory Visit
Admission: RE | Admit: 2014-04-14 | Discharge: 2014-04-14 | Disposition: A | Discharge status: Home / Self Care | Payer: BC Managed Care – PPO | Source: Ambulatory Visit | Attending: Radiation Oncology | Admitting: Radiation Oncology

## 2014-04-14 DIAGNOSIS — Z510 Encounter for antineoplastic radiation therapy: Secondary | ICD-10-CM | POA: Diagnosis not present

## 2014-04-15 ENCOUNTER — Ambulatory Visit
Admission: RE | Admit: 2014-04-15 | Discharge: 2014-04-15 | Disposition: A | Discharge status: Home / Self Care | Payer: BC Managed Care – PPO | Source: Ambulatory Visit | Attending: Radiation Oncology | Admitting: Radiation Oncology

## 2014-04-15 DIAGNOSIS — Z510 Encounter for antineoplastic radiation therapy: Secondary | ICD-10-CM | POA: Diagnosis not present

## 2014-04-16 ENCOUNTER — Ambulatory Visit
Admission: RE | Admit: 2014-04-16 | Discharge: 2014-04-16 | Disposition: A | Discharge status: Home / Self Care | Payer: BC Managed Care – PPO | Source: Ambulatory Visit | Attending: Radiation Oncology | Admitting: Radiation Oncology

## 2014-04-16 ENCOUNTER — Telehealth: Payer: Self-pay

## 2014-04-16 ENCOUNTER — Encounter: Payer: Self-pay | Admitting: Radiation Oncology

## 2014-04-16 VITALS — BP 92/71 | HR 92 | Temp 97.9°F

## 2014-04-16 DIAGNOSIS — C519 Malignant neoplasm of vulva, unspecified: Secondary | ICD-10-CM

## 2014-04-16 DIAGNOSIS — Z510 Encounter for antineoplastic radiation therapy: Secondary | ICD-10-CM | POA: Diagnosis not present

## 2014-04-16 NOTE — Telephone Encounter (Signed)
No return call received from patient. 

## 2014-04-16 NOTE — Progress Notes (Signed)
Jody Beard here today with report of feeling temporary tingling/numbnes in her 3rd and fourth finger of her left hand associated with dizziness while sitting in her car prior to entering the [facility].  Presently this has resolved  and she has a strong grip in this hand.  She received chemotherapy on this Monday, CDDP.

## 2014-04-16 NOTE — Progress Notes (Signed)
  Radiation Oncology         [phone]) [phone] ________________________________  Name: Jody Beard MRN: [id number]  Date: 04/16/2014  DOB: 24-Feb-1951  Weekly Radiation Therapy Management  Malignant neoplasm of lower lobe of left lung   Staging form: Lung, AJCC 7th Edition     Clinical: Stage IB (T2a, N0, M0) - Signed by [name], MD on 03/02/2014 Primary vulvar cancer   Staging form: Vulva, AJCC 7th Edition     Clinical: Stage IIIB (T2, N2b, M0) - Unsigned   Current Dose: 56 Gy     Planned Dose:  66 Gy  Narrative . . . . . . . . The patient presents for routine under treatment assessment.                                   The patient developed some tingling and numbness in her third and fourth finger of her left hand associated with some mild dizziness while sitting in her car prior to entering the [facility]. This spontaneously resolved. Patient feels fine at this time. She denied any loss of consciousness,  she did admit to some nausea at the time of her dizziness.                                 Set-up films were reviewed.                                 The chart was checked. Physical Findings. . .  temperature is 97.9 F (36.6 C). Her blood pressure is 92/71 and her pulse is 92. . Patient is alert and in no distress. The lungs are clear. The heart has a regular rhythm and rate. The neurological exam is nonfocal Impression . . . . . . . The patient is tolerating radiation. Plan . . . . . . . . . . . . Continue treatment as planned. I have asked patient to let us know if this issue recurs. If she develops more significant symptoms she knows to present to the emergency room.  ________________________________   Blair Promise, PhD, MD

## 2014-04-16 NOTE — Telephone Encounter (Signed)
-----   Message from Gordy Levan, MD sent at 04/13/2014  9:06 AM EST ----- May need extra IVF later this week - RN please check with her ~ Thurs 12-3 and set up 1 liter NS with K 20 and Mg 8 over 2-3 hrs if needed - She had a rough time Friday of last week. Thanks

## 2014-04-16 NOTE — Telephone Encounter (Signed)
Requested that [name] call back and let nurse know if she is drinking at least 64 oz of fluid a day as her BP was low today at Jody Beard visit. Jody Beard left messaged for Jody Beard nurse stating that patient had some dizziness and numbness in 3rd and 4th  fingers in left hand as noted in Jody Beard office note 04-16-14. Jody Beard stated that the antiemetics are effective with her  Nausea.

## 2014-04-17 ENCOUNTER — Telehealth: Payer: Self-pay | Admitting: Oncology

## 2014-04-17 ENCOUNTER — Other Ambulatory Visit: Payer: Self-pay | Admitting: Oncology

## 2014-04-17 ENCOUNTER — Ambulatory Visit (HOSPITAL_BASED_OUTPATIENT_CLINIC_OR_DEPARTMENT_OTHER): Payer: BC Managed Care – PPO

## 2014-04-17 ENCOUNTER — Ambulatory Visit
Admission: RE | Admit: 2014-04-17 | Discharge: 2014-04-17 | Disposition: A | Discharge status: Home / Self Care | Payer: BC Managed Care – PPO | Source: Ambulatory Visit | Attending: Radiation Oncology | Admitting: Radiation Oncology

## 2014-04-17 ENCOUNTER — Ambulatory Visit: Payer: BC Managed Care – PPO

## 2014-04-17 ENCOUNTER — Other Ambulatory Visit (HOSPITAL_BASED_OUTPATIENT_CLINIC_OR_DEPARTMENT_OTHER): Payer: BC Managed Care – PPO

## 2014-04-17 ENCOUNTER — Encounter: Payer: Self-pay | Admitting: Oncology

## 2014-04-17 ENCOUNTER — Ambulatory Visit (HOSPITAL_BASED_OUTPATIENT_CLINIC_OR_DEPARTMENT_OTHER): Payer: BC Managed Care – PPO | Admitting: Oncology

## 2014-04-17 ENCOUNTER — Ambulatory Visit (HOSPITAL_COMMUNITY)
Admission: RE | Admit: 2014-04-17 | Discharge: 2014-04-17 | Disposition: A | Discharge status: Home / Self Care | Payer: BC Managed Care – PPO | Source: Ambulatory Visit | Attending: Oncology | Admitting: Oncology

## 2014-04-17 VITALS — BP 108/58 | HR 80 | Temp 97.9°F | Resp 20

## 2014-04-17 DIAGNOSIS — C519 Malignant neoplasm of vulva, unspecified: Secondary | ICD-10-CM

## 2014-04-17 DIAGNOSIS — R42 Dizziness and giddiness: Secondary | ICD-10-CM

## 2014-04-17 DIAGNOSIS — C3432 Malignant neoplasm of lower lobe, left bronchus or lung: Secondary | ICD-10-CM | POA: Diagnosis present

## 2014-04-17 DIAGNOSIS — I959 Hypotension, unspecified: Secondary | ICD-10-CM

## 2014-04-17 DIAGNOSIS — R200 Anesthesia of skin: Secondary | ICD-10-CM | POA: Insufficient documentation

## 2014-04-17 DIAGNOSIS — G629 Polyneuropathy, unspecified: Secondary | ICD-10-CM

## 2014-04-17 DIAGNOSIS — Z9221 Personal history of antineoplastic chemotherapy: Secondary | ICD-10-CM | POA: Insufficient documentation

## 2014-04-17 DIAGNOSIS — Z510 Encounter for antineoplastic radiation therapy: Secondary | ICD-10-CM | POA: Diagnosis not present

## 2014-04-17 LAB — COMPREHENSIVE METABOLIC PANEL (CC13)
ALBUMIN: 3.3 g/dL — AB (ref 3.5–5.0)
ALT: 14 U/L (ref 0–55)
AST: 12 U/L (ref 5–34)
Alkaline Phosphatase: 121 U/L (ref 40–150)
Anion Gap: 13 mEq/L — ABNORMAL HIGH (ref 3–11)
BUN: 10.6 mg/dL (ref 7.0–26.0)
CHLORIDE: 95 meq/L — AB (ref 98–109)
CO2: 22 mEq/L (ref 22–29)
CREATININE: 0.8 mg/dL (ref 0.6–1.1)
Calcium: 9.9 mg/dL (ref 8.4–10.4)
EGFR: 75 mL/min/{1.73_m2} — AB (ref >90)
GLUCOSE: 97 mg/dL (ref 70–140)
POTASSIUM: 3.9 meq/L (ref 3.5–5.1)
Sodium: 130 mEq/L — ABNORMAL LOW (ref 136–145)
Total Bilirubin: 0.38 mg/dL (ref 0.20–1.20)
Total Protein: 6.8 g/dL (ref 6.4–8.3)

## 2014-04-17 LAB — CBC WITH DIFFERENTIAL/PLATELET
BASO%: 0.2 % (ref 0.0–2.0)
BASOS ABS: 0 10*3/uL (ref 0.0–0.1)
EOS%: 0.2 % (ref 0.0–7.0)
Eosinophils Absolute: 0 10*3/uL (ref 0.0–0.5)
HEMATOCRIT: 31.5 % — AB (ref 34.8–46.6)
HEMOGLOBIN: 10.7 g/dL — AB (ref 11.6–15.9)
LYMPH#: 0.2 10*3/uL — AB (ref 0.9–3.3)
LYMPH%: 5.7 % — AB (ref 14.0–49.7)
MCH: 29.2 pg (ref 25.1–34.0)
MCHC: 34 g/dL (ref 31.5–36.0)
MCV: 85.8 fL (ref 79.5–101.0)
MONO#: 0.5 10*3/uL (ref 0.1–0.9)
MONO%: 12 % (ref 0.0–14.0)
NEUT#: 3.5 10*3/uL (ref 1.5–6.5)
NEUT%: 81.9 % — AB (ref 38.4–76.8)
Platelets: 136 10*3/uL — ABNORMAL LOW (ref 145–400)
RBC: 3.67 10*6/uL — ABNORMAL LOW (ref 3.70–5.45)
RDW: 15.4 % — ABNORMAL HIGH (ref 11.2–14.5)
WBC: 4.2 10*3/uL (ref 3.9–10.3)
nRBC: 0 % (ref 0–0)

## 2014-04-17 LAB — MAGNESIUM (CC13): MAGNESIUM: 1.9 mg/dL (ref 1.5–2.5)

## 2014-04-17 MED ORDER — SODIUM CHLORIDE 0.9 % IV SOLN
1000.0000 mL | Freq: Once | INTRAVENOUS | Status: AC
  Administered 2014-04-17: 10:00:00 via INTRAVENOUS

## 2014-04-17 MED ORDER — IOHEXOL 300 MG/ML  SOLN
100.0000 mL | Freq: Once | INTRAMUSCULAR | Status: AC | PRN
  Administered 2014-04-17: 100 mL via INTRAVENOUS

## 2014-04-17 NOTE — Progress Notes (Signed)
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 She continues radiation by Dr Isidore Moos. Patient seen today for complaints of an episode of dizziness with difficulty focusing yesterday which lasted about 15 minutes. Patient also had numbness in her left 3rd and 4th fingers at that time. Dizziness and numbness in fingers has resolved. She noticed numbness on the left side of her upper lip this am lasting about 15 minutes. This has now resolved. Denies headaches, visions changes, slurred speech, weakness in her extremities. Thinks that she is eating and drinking better. No longer having diarrhea. Denies nausea and vomiting.       She is using Oxycodone 3 times a day (increased from 2 times a day earlier this week) which is helping her discomfort. Uses Ativan as needed for nausea. Of note, patient had taken both of these medications yesterday prior to episode of dizziness.    ONCOLOGIC HISTORY Patient had no regular medical care at least since moving to [place] several years ago. She had gradually enlarging mass at right vulva for ~ 8 months, uncomfortable with direct pressure and ulcerated with occasional bleeding. She was seen in ED on 02-02-14 with right inguinal adenopathy and right vulvar masses, then seen by Dr Elonda Husky on 02-03-14, clinically with vulvar carcinoma. She was then seen in consultation by Dr Skeet Latch on 02-05-14, with biopsy of both vulvar mass and satellite lesions on inner thigh and mons. Her exam found 7 cm right vulvar mass with 4 cm extension to distal vagina, extending anteriorly to urethral meatus and in ischiorectal area, with satellite lesions inner thigh and right mons. Pathology [phone]) showed invasive squamous cell carcinoma arising in  background of high grade squamous intraepithelial lesion from vulva biopsy and invasive moderately differentiated squamous cell carcinoma from inner thigh lesion. CXR 02-06-14 showed left lower lobe lung mass 3.6 x 3.6 x 3.6 cm, with no adenopathy and no bony lesions, emphysematous changes. CT chest 02-09-14 showed the lower lobe mass 3.7 x 3.5 x 3.3 cm as well as 3 mm RLL nodule and faint 4 mm LUL nodule, no mediastinal or hilar nodes, no axillary or supraclavicular nodes, and upper abdomen not remarkable. PET 02-18-14 measured LLL lung mass 4.6 x 3.9 cm, hypermetabolic, and noted multiple subcentimeter nodules scattered thruout lungs bilaterally; the right vulvar mass was also hypermetabolic, extending up into right side of lower vagina, extensive R>L and right external iliac adenopathy, no uptake in skeleton. 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 After initial consideration of bilateral inguinal lymphadenectomy, plan subsequently changed to radiation with sensitizing CDDP. She had 3 stereotactic body radiation treatments to the LLL pulmonary mass in 02-2014. She began IMRT to vulvar area on 03-09-14, planned thru 04-24-14, with weekly CDDP as radiation sensitizer, first CDDP 03-09-14   Review of systems as above, also: No other itching, no other bleeding. No significant cough, no wheezing. Bladder ok. No LE swelling. 2 sticks for blood draw today, peripheral IVs have been adequately accomplished. Remainder of 10 point Review of Systems negative.  Objective:  Vital signs in last 24 hours:  BP 108/58 mmHg  Pulse 80  Temp(Src) 97.9 F (36.6 C)  Resp 20 Alert, oriented and appropriate. Ambulatory without assistance,  unable to sit flat.  No alopecia  HEENT: EOMI. PERRL, sclerae not icteric. Oral mucosa fairly moist, without lesions, posterior pharynx clear.  Neck supple. No JVD.  Lymphatics:no cervical,supraclavicular, left  inguinal adenopathy. Right inguinal node still easily palpable tho slightly smaller that prior to RT. Resp: diminished BS thruout otherwise clear to auscultation bilaterally and no dullness to percussion bilaterally Cardio: regular rate and rhythm. No gallop. GI: soft, nontender, not distended, no mass or organomegaly. Normally active bowel sounds.  Musculoskeletal/ Extremities: without pitting edema, cords, tenderness Neuro: CN II-XI are grossly intact. no peripheral neuropathy. Otherwise nonfocal. PSYCH appropriate mood and affect.    Lab Results:  Results for orders placed or performed in visit on 04/13/14  CBC with Differential  Result Value Ref Range   WBC 4.4 3.9 - 10.3 10e3/uL   NEUT# 3.5 1.5 - 6.5 10e3/uL   HGB 11.2 (L) 11.6 - 15.9 g/dL   HCT 34.1 (L) 34.8 - 46.6 %   Platelets 146 145 - 400 10e3/uL   MCV 86.8 79.5 - 101.0 fL   MCH 28.6 25.1 - 34.0 pg   MCHC 32.9 31.5 - 36.0 g/dL   RBC 3.93 3.70 - 5.45 10e6/uL   RDW 14.3 11.2 - 14.5 %   lymph# 0.2 (L) 0.9 - 3.3 10e3/uL   MONO# 0.5 0.1 - 0.9 10e3/uL   Eosinophils Absolute 0.1 0.0 - 0.5 10e3/uL   Basophils Absolute 0.0 0.0 - 0.1 10e3/uL   NEUT% 79.4 (H) 38.4 - 76.8 %   LYMPH% 5.5 (L) 14.0 - 49.7 %   MONO% 12.0 0.0 - 14.0 %   EOS% 2.2 0.0 - 7.0 %   BASO% 0.9 0.0 - 2.0 %  Comprehensive metabolic panel (Cmet) - [facility]  Result Value Ref Range   Sodium 134 (L) 136 - 145 mEq/L   Potassium 3.8 3.5 - 5.1 mEq/L   Chloride 98 98 - 109 mEq/L   CO2 23 22 - 29 mEq/L   Glucose 109 70 - 140 mg/dl   BUN 9.4 7.0 - 26.0 mg/dL   Creatinine 0.8 0.6 - 1.1 mg/dL   Total Bilirubin 0.36 0.20 - 1.20 mg/dL   Alkaline Phosphatase 112 40 - 150 U/L   AST 15 5 - 34 U/L   ALT 17 0 - 55 U/L   Total Protein 7.0 6.4 - 8.3 g/dL   Albumin 3.6 3.5 - 5.0 g/dL   Calcium 9.9 8.4 - 10.4 mg/dL   Anion Gap 13 (H) 3 - 11 mEq/L  Magnesium  Result Value Ref Range   Magnesium 2.1 1.5 - 2.5 mg/dl    CBC and CMET reviewed from today. WBC 4.24, Hgb 10.7, Plt  136 Na+ 130, K+ 3.93, glucose 96.71, creatinine 0.84, liver function normal. Mg 1.92  Studies/Results:  No results found.  Medications: I have reviewed the patient's current medications. Written and oral instructions: Begin enteric coated aspirin 81 mg daily.  DISCUSSION: Concerned about possible TIA. Begin ASA as noted above. Expect will give CDDP also on 04-20-14, which is last planned RT, as long as tolerating treatment then. Will get CT chest ~ mid Dec, then expect restaging following RT to be at least 4 weeks after RT completes.   Communication from Dr Isidore Moos following visit, to this MD and  Gyn onc: as patient is tolerating treatment well and having significant improvement, Dr Isidore Moos is adding 3 fractions to boost gross disease to 66 Gy, as discussed with Dr Denman George, in hopes of avoiding surgery. Dr Marko Plume will  order PET at least 4 weeks after treatment, and patient should see Dr Denman George after PET.    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 RT extended by 3 fractions; if stable, will give cycle 7 weekly CDDP on 12-7 with the additional RT. I will get CT chest in 2-3 weeks and see her after that scan; she will need PET ~ 4 weeks after completion of RT and follow up with Dr Denman George then.  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 Also multiple bilateral subcentimeter pulmonary nodules concerning for metastatic disease, lung primary or vulvar. SBRT to the LLL mass 10-16, 19 and 21-2015. Continuing to follow pulmonary status as other areas are treated, reimage after completion of vulvar area RT as above. 3.long past tobacco  4. BP runs somewhat low, not on antihypertensives. Continue to push po fluids and follow. IVF today. She may need additional IVF next week. 5.history migraine  HAs. No HA with zofran.  6.allergy PCN  7.BTL  8.has been given information for Advance Directives 9.flu vaccine given 10. Dizziness and intermittent neuropathy. Cause is unclear. Neuro exam is non-focal today. Could be TIA vs. Brain mets. Patient instructed to begin ASA as above. Will obtain STAT CT of head today to eval for infarct and brain mets. Patient instructed to go to nearest ER if she develops recurrent dizziness, difficulty focusing, slurred speech, or weakness in extremities for evaluation. Patient understands these instructions.   Case reviewed with Dr Marko Plume. She will keep follow-up as scheduled in our office on 12/7.   All questions answered. Time spent 30 min including >50% counseling and coordination of care.  Mikey Bussing, NP   04/17/2014, 12:53 PM

## 2014-04-17 NOTE — Patient Instructions (Signed)
Begin enteric coated aspirin 81 mg daily. This is available over the counter.  You will have a CT of the head today.  If you develop any further dizziness, numbness, vision changes, weakness go to nearest ER for evaluation.

## 2014-04-17 NOTE — Patient Instructions (Signed)
Dehydration, Adult Dehydration is when you lose more fluids from the body than you take in. Vital organs like the kidneys, brain, and heart cannot function without a proper amount of fluids and salt. Any loss of fluids from the body can cause dehydration.  CAUSES   Vomiting.  Diarrhea.  Excessive sweating.  Excessive urine output.  Fever. SYMPTOMS  Mild dehydration  Thirst.  Dry lips.  Slightly dry mouth. Moderate dehydration  Very dry mouth.  Sunken eyes.  Skin does not bounce back quickly when lightly pinched and released.  Dark urine and decreased urine production.  Decreased tear production.  Headache. Severe dehydration  Very dry mouth.  Extreme thirst.  Rapid, weak pulse (more than 100 beats per minute at rest).  Cold hands and feet.  Not able to sweat in spite of heat and temperature.  Rapid breathing.  Blue lips.  Confusion and lethargy.  Difficulty being awakened.  Minimal urine production.  No tears. DIAGNOSIS  Your caregiver will diagnose dehydration based on your symptoms and your exam. Blood and urine tests will help confirm the diagnosis. The diagnostic evaluation should also identify the cause of dehydration. TREATMENT  Treatment of mild or moderate dehydration can often be done at home by increasing the amount of fluids that you drink. It is best to drink small amounts of fluid more often. Drinking too much at one time can make vomiting worse. Refer to the home care instructions below. Severe dehydration needs to be treated at the hospital where you will probably be given intravenous (IV) fluids that contain water and electrolytes. HOME CARE INSTRUCTIONS   Ask your caregiver about specific rehydration instructions.  Drink enough fluids to keep your urine clear or pale yellow.  Drink small amounts frequently if you have nausea and vomiting.  Eat as you normally do.  Avoid:  Foods or drinks high in sugar.  Carbonated  drinks.  Juice.  Extremely hot or cold fluids.  Drinks with caffeine.  Fatty, greasy foods.  Alcohol.  Tobacco.  Overeating.  Gelatin desserts.  Wash your hands well to avoid spreading bacteria and viruses.  Only take over-the-counter or prescription medicines for pain, discomfort, or fever as directed by your caregiver.  Ask your caregiver if you should continue all prescribed and over-the-counter medicines.  Keep all follow-up appointments with your caregiver. SEEK MEDICAL CARE IF:  You have abdominal pain and it increases or stays in one area (localizes).  You have a rash, stiff neck, or severe headache.  You are irritable, sleepy, or difficult to awaken.  You are weak, dizzy, or extremely thirsty. SEEK IMMEDIATE MEDICAL CARE IF:   You are unable to keep fluids down or you get worse despite treatment.  You have frequent episodes of vomiting or diarrhea.  You have blood or green matter (bile) in your vomit.  You have blood in your stool or your stool looks black and tarry.  You have not urinated in 6 to 8 hours, or you have only urinated a small amount of very dark urine.  You have a fever.  You faint. MAKE SURE YOU:   Understand these instructions.  Will watch your condition.  Will get help right away if you are not doing well or get worse. Document Released: 05/01/2005 Document Revised: 07/24/2011 Document Reviewed: 12/19/2010 ExitCare Patient Information 2015 [employer]. This information is not intended to replace advice given to you by your health care provider. Make sure you discuss any questions you have with your health care   provider.  

## 2014-04-17 NOTE — Telephone Encounter (Signed)
per pof to sch CT stat-CT already sch by CS

## 2014-04-17 NOTE — Telephone Encounter (Signed)
added appt per [facility]...per charge nurse no available time for IVF....[place] aware

## 2014-04-17 NOTE — Telephone Encounter (Signed)
per pof to add iv today & sch CT-both already sch

## 2014-04-20 ENCOUNTER — Encounter: Payer: Self-pay | Admitting: Radiation Oncology

## 2014-04-20 ENCOUNTER — Other Ambulatory Visit: Payer: Self-pay | Admitting: Oncology

## 2014-04-20 ENCOUNTER — Ambulatory Visit: Payer: BC Managed Care – PPO

## 2014-04-20 ENCOUNTER — Ambulatory Visit
Admission: RE | Admit: 2014-04-20 | Discharge: 2014-04-20 | Disposition: A | Discharge status: Home / Self Care | Payer: BC Managed Care – PPO | Source: Ambulatory Visit | Attending: Radiation Oncology | Admitting: Radiation Oncology

## 2014-04-20 ENCOUNTER — Other Ambulatory Visit (HOSPITAL_BASED_OUTPATIENT_CLINIC_OR_DEPARTMENT_OTHER): Payer: BC Managed Care – PPO

## 2014-04-20 ENCOUNTER — Ambulatory Visit (HOSPITAL_BASED_OUTPATIENT_CLINIC_OR_DEPARTMENT_OTHER): Payer: BC Managed Care – PPO | Admitting: Oncology

## 2014-04-20 ENCOUNTER — Encounter: Payer: Self-pay | Admitting: Oncology

## 2014-04-20 ENCOUNTER — Ambulatory Visit (HOSPITAL_BASED_OUTPATIENT_CLINIC_OR_DEPARTMENT_OTHER): Payer: BC Managed Care – PPO

## 2014-04-20 ENCOUNTER — Ambulatory Visit: Payer: BC Managed Care – PPO | Admitting: Nutrition

## 2014-04-20 ENCOUNTER — Other Ambulatory Visit: Payer: BC Managed Care – PPO

## 2014-04-20 VITALS — BP 96/56 | HR 79 | Temp 98.0°F | Resp 18 | Ht 61.0 in | Wt 122.5 lb

## 2014-04-20 VITALS — BP 105/69 | HR 79 | Temp 97.7°F | Ht 61.0 in | Wt 125.5 lb

## 2014-04-20 DIAGNOSIS — C778 Secondary and unspecified malignant neoplasm of lymph nodes of multiple regions: Secondary | ICD-10-CM

## 2014-04-20 DIAGNOSIS — C519 Malignant neoplasm of vulva, unspecified: Secondary | ICD-10-CM

## 2014-04-20 DIAGNOSIS — Z510 Encounter for antineoplastic radiation therapy: Secondary | ICD-10-CM | POA: Diagnosis not present

## 2014-04-20 DIAGNOSIS — G458 Other transient cerebral ischemic attacks and related syndromes: Secondary | ICD-10-CM

## 2014-04-20 DIAGNOSIS — C792 Secondary malignant neoplasm of skin: Secondary | ICD-10-CM

## 2014-04-20 DIAGNOSIS — C3432 Malignant neoplasm of lower lobe, left bronchus or lung: Secondary | ICD-10-CM

## 2014-04-20 DIAGNOSIS — D630 Anemia in neoplastic disease: Secondary | ICD-10-CM

## 2014-04-20 DIAGNOSIS — C7982 Secondary malignant neoplasm of genital organs: Secondary | ICD-10-CM

## 2014-04-20 DIAGNOSIS — Z5111 Encounter for antineoplastic chemotherapy: Secondary | ICD-10-CM

## 2014-04-20 LAB — COMPREHENSIVE METABOLIC PANEL (CC13)
ALT: 15 U/L (ref 0–55)
AST: 11 U/L (ref 5–34)
Albumin: 3.2 g/dL — ABNORMAL LOW (ref 3.5–5.0)
Alkaline Phosphatase: 106 U/L (ref 40–150)
Anion Gap: 13 mEq/L — ABNORMAL HIGH (ref 3–11)
BUN: 9.1 mg/dL (ref 7.0–26.0)
CALCIUM: 9.5 mg/dL (ref 8.4–10.4)
CHLORIDE: 99 meq/L (ref 98–109)
CO2: 21 mEq/L — ABNORMAL LOW (ref 22–29)
Creatinine: 0.7 mg/dL (ref 0.6–1.1)
EGFR: 87 mL/min/{1.73_m2} — ABNORMAL LOW (ref >90)
Glucose: 115 mg/dl (ref 70–140)
Potassium: 3.6 mEq/L (ref 3.5–5.1)
Sodium: 132 mEq/L — ABNORMAL LOW (ref 136–145)
Total Bilirubin: 0.29 mg/dL (ref 0.20–1.20)
Total Protein: 6.5 g/dL (ref 6.4–8.3)

## 2014-04-20 LAB — CBC WITH DIFFERENTIAL/PLATELET
BASO%: 0.3 % (ref 0.0–2.0)
Basophils Absolute: 0 10*3/uL (ref 0.0–0.1)
EOS%: 1.5 % (ref 0.0–7.0)
Eosinophils Absolute: 0.1 10*3/uL (ref 0.0–0.5)
HCT: 29.8 % — ABNORMAL LOW (ref 34.8–46.6)
HGB: 9.9 g/dL — ABNORMAL LOW (ref 11.6–15.9)
LYMPH#: 0.3 10*3/uL — AB (ref 0.9–3.3)
LYMPH%: 6.4 % — AB (ref 14.0–49.7)
MCH: 29 pg (ref 25.1–34.0)
MCHC: 33.2 g/dL (ref 31.5–36.0)
MCV: 87.4 fL (ref 79.5–101.0)
MONO#: 0.4 10*3/uL (ref 0.1–0.9)
MONO%: 9.8 % (ref 0.0–14.0)
NEUT#: 3.2 10*3/uL (ref 1.5–6.5)
NEUT%: 82 % — ABNORMAL HIGH (ref 38.4–76.8)
PLATELETS: 119 10*3/uL — AB (ref 145–400)
RBC: 3.41 10*6/uL — AB (ref 3.70–5.45)
RDW: 15.5 % — ABNORMAL HIGH (ref 11.2–14.5)
WBC: 3.9 10*3/uL (ref 3.9–10.3)

## 2014-04-20 LAB — MAGNESIUM (CC13): Magnesium: 1.8 mg/dl (ref 1.5–2.5)

## 2014-04-20 MED ORDER — ONDANSETRON 16 MG/50ML IVPB (CHCC)
16.0000 mg | Freq: Once | INTRAVENOUS | Status: AC
  Administered 2014-04-20: 16 mg via INTRAVENOUS

## 2014-04-20 MED ORDER — ONDANSETRON 16 MG/50ML IVPB (CHCC)
INTRAVENOUS | Status: AC
Start: 2014-04-20 — End: 2014-04-20
  Filled 2014-04-20: qty 16

## 2014-04-20 MED ORDER — SODIUM CHLORIDE 0.9 % IV SOLN
Freq: Once | INTRAVENOUS | Status: AC
  Administered 2014-04-20: 11:00:00 via INTRAVENOUS

## 2014-04-20 MED ORDER — DEXAMETHASONE SODIUM PHOSPHATE 20 MG/5ML IJ SOLN
INTRAMUSCULAR | Status: AC
  Filled 2014-04-20: qty 5

## 2014-04-20 MED ORDER — DEXAMETHASONE SODIUM PHOSPHATE 20 MG/5ML IJ SOLN
12.0000 mg | Freq: Once | INTRAMUSCULAR | Status: AC
  Administered 2014-04-20: 12 mg via INTRAVENOUS

## 2014-04-20 MED ORDER — POTASSIUM CHLORIDE 2 MEQ/ML IV SOLN
Freq: Once | INTRAVENOUS | Status: AC
  Administered 2014-04-20: 11:00:00 via INTRAVENOUS
  Filled 2014-04-20: qty 10

## 2014-04-20 MED ORDER — SODIUM CHLORIDE 0.9 % IV SOLN
150.0000 mg | Freq: Once | INTRAVENOUS | Status: AC
  Administered 2014-04-20: 150 mg via INTRAVENOUS
  Filled 2014-04-20: qty 5

## 2014-04-20 MED ORDER — CISPLATIN CHEMO INJECTION 100MG/100ML
40.0000 mg/m2 | Freq: Once | INTRAVENOUS | Status: AC
  Administered 2014-04-20: 64 mg via INTRAVENOUS
  Filled 2014-04-20: qty 64

## 2014-04-20 NOTE — Progress Notes (Signed)
   Weekly Management Note:  outpatient    ICD-9-CM ICD-10-CM   1. Primary vulvar cancer 184.4 C51.9     Current Dose:  60 Gy  Projected Dose: 66Gy   Narrative:  The patient presents for routine under treatment assessment.  CBCT/MVCT images/Port film x-rays were reviewed.  The chart was checked. She is doing relatively well.  Vulva irritation is still tolerable.  Seeing med/onc today.   CT head last week negative for neurologic complaints that resolved after decreasing pain meds.  Physical Findings:  height is 5\' 1"  (1.549 m) and weight is 125 lb 8 oz (56.926 kg). Her temperature is 97.7 F (36.5 C). Her blood pressure is 105/69 and her pulse is 79.  vulva - swollen, but continued marked tumor regression. I still see tumor at least in posterior right vulva.  Moist desquamation in patches.  Dryness and erythema diffusely. Some superficial moist desquamation of groin.  CBC    Component Value Date/Time   WBC 3.9 04/20/2014 0801   WBC 7.4 02/23/2014 0730   RBC 3.41* 04/20/2014 0801   RBC 4.33 02/23/2014 0730   HGB 9.9* 04/20/2014 0801   HGB 12.5 02/23/2014 0730   HCT 29.8* 04/20/2014 0801   HCT 38.2 02/23/2014 0730   PLT 119* 04/20/2014 0801   PLT 203 02/23/2014 0730   MCV 87.4 04/20/2014 0801   MCV 88.2 02/23/2014 0730   MCH 29.0 04/20/2014 0801   MCH 28.9 02/23/2014 0730   MCHC 33.2 04/20/2014 0801   MCHC 32.7 02/23/2014 0730   RDW 15.5* 04/20/2014 0801   RDW 14.1 02/23/2014 0730   LYMPHSABS 0.3* 04/20/2014 0801   LYMPHSABS 1.1 02/23/2014 0730   MONOABS 0.4 04/20/2014 0801   MONOABS 0.9 02/23/2014 0730   EOSABS 0.1 04/20/2014 0801   EOSABS 0.2 02/23/2014 0730   BASOSABS 0.0 04/20/2014 0801   BASOSABS 0.0 02/23/2014 0730     CMP     Component Value Date/Time   NA 132* 04/20/2014 0802   NA 140 02/06/2014 1110   K 3.6 04/20/2014 0802   K 4.9 02/06/2014 1110   CL 101 02/06/2014 1110   CO2 21* 04/20/2014 0802   CO2 26 02/06/2014 1110   GLUCOSE 115 04/20/2014 0802   GLUCOSE 105* 02/06/2014 1110   BUN 9.1 04/20/2014 0802   BUN 8 02/06/2014 1110   CREATININE 0.7 04/20/2014 0802   CREATININE 0.72 02/06/2014 1110   CALCIUM 9.5 04/20/2014 0802   CALCIUM 10.3 02/06/2014 1110   PROT 6.5 04/20/2014 0802   PROT 8.2 02/06/2014 1110   ALBUMIN 3.2* 04/20/2014 0802   ALBUMIN 4.3 02/06/2014 1110   AST 11 04/20/2014 0802   AST 15 02/06/2014 1110   ALT 15 04/20/2014 0802   ALT 11 02/06/2014 1110   ALKPHOS 106 04/20/2014 0802   ALKPHOS 97 02/06/2014 1110   BILITOT 0.29 04/20/2014 0802   BILITOT 0.3 02/06/2014 1110   GFRNONAA >90 02/06/2014 1110   GFRAA >90 02/06/2014 1110     Impression:  The patient is tolerating radiotherapy.   Plan:  Continue radiotherapy as planned  Silvadene, sitz baths, Biafine to continue. -----------------------------------  [name], MD

## 2014-04-20 NOTE — Progress Notes (Signed)
Nutrition follow up completed with patient in chemotherapy. Patient reports she had a reaction to her medications this past week. Patient ate well over the weekend. Weight increase in documented as 125.5 pounds December 7 up from 122.5 pounds November 30.  Patient is status post 30 fractions of radiation therapy. Labs reviewed and noted sodium 132, and albumin 3.2.  Nutrition diagnosis: Food and nutrition related knowledge deficit resolved.  Patient educated to continue strategies for high-calorie, high-protein meals and snacks. Questions were answered.  Teach back method used.  Patient will work to continue weight stabilization./Gain.  Follow up has not been scheduled.  Patient will contact me for questions.

## 2014-04-20 NOTE — Progress Notes (Signed)
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 IMRT is planned thru 04-23-14, with last planned weekly CDDP today. She will have CT chest on 05-04-14; she will need PET >= 4 weeks after completion of RT (4 weeks = Jan 7) and to see Dr Denman George after than imaging. Patient was seen for unscheduled visit by APP on 04-17-14 due to acute episode of slurred speech x 15 min on 04-16-14, with numbness left upper lip and 3-4-5 fingers of left hand. CT head with contrast 04-17-14 showed no metastatic disease or other acute findings, and patient began 81 mg ASA daily. SHe also had IVF on 04-17-14.  I have explained to patient that I doubt the problem was related to increase in tramadol to every 8 hrs a couple of days prior, and recommend evaluation for TIA. She has had no residual or recurrent neurologic symptoms since 04-16-14. She has tried to decrease tramadol despite continued marked discomfort from skin reaction in perineum.  Patient is still very uncomfortable from extensive skin ulceration across perineum. Viscous lidocaine + silvadene is most helpful, and she continues regular sitz baths. She denies fever, diarrhea, increased SOB. She has some minimal bleeding from perineum. She denies significant nausea, tho appetite is poor. She has had diarrhea only once in last 48 hrs, controlled with imodium.  She does not have PAC Flu vaccine done.  ONCOLOGIC HISTORY Patient had no regular medical care at least since moving to [place] several years ago. She had gradually enlarging mass at right vulva for ~ 8 months, uncomfortable with direct pressure and ulcerated with occasional bleeding. She was seen in ED on 02-02-14 with right inguinal adenopathy and right vulvar masses, then seen by Dr Elonda Husky on 02-03-14,  clinically with vulvar carcinoma. She was then seen in consultation by Dr Skeet Latch on 02-05-14, with biopsy of both vulvar mass and satellite lesions on inner thigh and mons. Her exam found 7 cm right vulvar mass with 4 cm extension to distal vagina, extending anteriorly to urethral meatus and in ischiorectal area, with satellite lesions inner thigh and right mons. Pathology [phone]) showed invasive squamous cell carcinoma arising in background of high grade squamous intraepithelial lesion from vulva biopsy and invasive moderately differentiated squamous cell carcinoma from inner thigh lesion. CXR 02-06-14 showed left lower lobe lung mass 3.6 x 3.6 x 3.6 cm, with no adenopathy and no bony lesions, emphysematous changes. CT chest 02-09-14 showed the lower lobe mass 3.7 x 3.5 x 3.3 cm as well as 3 mm RLL nodule and faint 4 mm LUL nodule, no mediastinal or hilar nodes, no axillary or supraclavicular nodes, and upper abdomen not remarkable. PET 02-18-14 measured LLL lung mass 4.6 x 3.9 cm, hypermetabolic, and noted multiple subcentimeter nodules scattered thruout lungs bilaterally; the right vulvar mass was also hypermetabolic, extending up into right side of lower vagina, extensive R>L and right external iliac adenopathy, no uptake in skeleton. 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 After initial consideration of bilateral inguinal lymphadenectomy, plan subsequently changed to radiation with sensitizing CDDP. She had 3 stereotactic body radiation treatments to the LLL pulmonary mass in 02-2014. She began IMRT to vulvar area on 03-09-14, planned thru 04-24-14, with weekly CDDP as radiation sensitizer, first CDDP 03-09-14  Review of systems as above, also: No productive cough or hemoptysis. No increased SOB. No new or different pain. Difficulty sleeping due to perineal discomfort. No LE swelling. Remainder of 10 point Review of Systems  negative.  Objective:  Vital signs in last 24 hours:  BP 96/56 mmHg  Pulse 79  Temp(Src) 98 F (36.7 C) (Oral)  Resp 18  Ht 5' 1"  (1.549 m)  Wt 122 lb 8 oz (55.566 kg)  BMI 23.16 kg/m2  SpO2 99% weight stable from 04-13-14. Weight down 0.5 lb. Alert, oriented and appropriate. Ambulatory without assistance, cannot sit..  No alopecia  HEENT:PERRL, sclerae not icteric. Oral mucosa moist without lesions, posterior pharynx clear.  Neck supple. No JVD.  Lymphatics:no cervical,supraclavicular adenopathy Resp: diminished BS thruout without wheezes, rales or dullness to percussionclear bilaterally Cardio: regular rate and rhythm. No gallop. GI: soft, nontender, not distended, no mass or organomegaly. Some bowel sounds.  Perineum with extensive wet desquamation especially right vulvar area, no bleeding or drainage. Musculoskeletal/ Extremities: without pitting edema, cords, tenderness Neuro: speech fluent and appropriate. No facial droop. Motor and sensory intact and symmetrical. Skin poor turgor, dry, without rash, ecchymosis, petechiae  Lab Results:  Results for orders placed or performed in visit on 04/20/14  CBC with Differential  Result Value Ref Range   WBC 3.9 3.9 - 10.3 10e3/uL   NEUT# 3.2 1.5 - 6.5 10e3/uL   HGB 9.9 (L) 11.6 - 15.9 g/dL   HCT 29.8 (L) 34.8 - 46.6 %   Platelets 119 (L) 145 - 400 10e3/uL   MCV 87.4 79.5 - 101.0 fL   MCH 29.0 25.1 - 34.0 pg   MCHC 33.2 31.5 - 36.0 g/dL   RBC 3.41 (L) 3.70 - 5.45 10e6/uL   RDW 15.5 (H) 11.2 - 14.5 %   lymph# 0.3 (L) 0.9 - 3.3 10e3/uL   MONO# 0.4 0.1 - 0.9 10e3/uL   Eosinophils Absolute 0.1 0.0 - 0.5 10e3/uL   Basophils Absolute 0.0 0.0 - 0.1 10e3/uL   NEUT% 82.0 (H) 38.4 - 76.8 %   LYMPH% 6.4 (L) 14.0 - 49.7 %   MONO% 9.8 0.0 - 14.0 %   EOS% 1.5 0.0 - 7.0 %   BASO% 0.3 0.0 - 2.0 %  Magnesium  Result Value Ref Range   Magnesium 1.8 1.5 - 2.5 mg/dl  Comprehensive metabolic panel  Result Value Ref Range   Sodium 132  (L) 136 - 145 mEq/L   Potassium 3.6 3.5 - 5.1 mEq/L   Chloride 99 98 - 109 mEq/L   CO2 21 (L) 22 - 29 mEq/L   Glucose 115 70 - 140 mg/dl   BUN 9.1 7.0 - 26.0 mg/dL   Creatinine 0.7 0.6 - 1.1 mg/dL   Total Bilirubin 0.29 0.20 - 1.20 mg/dL   Alkaline Phosphatase 106 40 - 150 U/L   AST 11 5 - 34 U/L   ALT 15 0 - 55 U/L   Total Protein 6.5 6.4 - 8.3 g/dL   Albumin 3.2 (L) 3.5 - 5.0 g/dL   Calcium 9.5 8.4 - 10.4 mg/dL   Anion Gap 13 (H) 3 - 11 mEq/L   EGFR 87 (L) >90 ml/min/1.73 m2     Studies/Results: CT HEAD WITHOUT AND WITH CONTRAST  04-17-14  TECHNIQUE: Contiguous axial images were obtained from the base of the skull through the vertex without and with intravenous contrast  CONTRAST: 172m OMNIPAQUE IOHEXOL 300 MG/ML SOLN  COMPARISON: MR head 04/12/2012. CT head without contrast 04/11/2012. PET scan  02/18/2014.  FINDINGS: No evidence for acute infarction, hemorrhage, mass lesion, hydrocephalus, or extra-axial fluid. There is no atrophy or white matter disease. Post infusion, no abnormal enhancement. Calvarium intact. No sinus or mastoid disease.  IMPRESSION: Negative exam.  Medications: I have reviewed the patient's current medications. Doubt tramadol was cause of transient neurologic symptoms last week, and was helpful for pain. Continue low dose ASA.  DISCUSSION: neurology referral - I have spoken with that office [phone]) and LM for [facility] about this situation; RN to fax referral to [phone]. If symptoms recur, she needs to be seen immediately in ED.  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 RT extended by 3 fractions thru 04-23-14; will give cycle 7 weekly CDDP today with the additional RT. CT chest 05-04-14 and I will see her with labs after that scan; she will need PET ~ 4 weeks after completion of RT and  follow up with Dr Denman George then.  2.Squamous cell carcinoma of LLL lung: long past tobacco; imaging and final path suggests primary lung, but is same histology as vulvar primary;  multiple bilateral subcentimeter pulmonary nodules concerning for metastatic disease, lung primary or vulvar. SBRT to the LLL mass 10-16, 19 and 21-2015. Continuing to follow pulmonary status as other areas are treated, reimage after completion of vulvar area RT as above. Long past tobacco. 3.transient speech difficulty with numbness of lip and fingers 3-4-5 left hand last week, resolved after ~ 15 min without interventions. Head CT without obvious met or bleed. Suggests TIA. Neurology referral, now on ASA.  4. B somewhat low, not on antihypertensives. Continue to push po fluids and follow. She may need additional IVF. 5.history migraine HAs. No HA with zofran.  6.allergy PCN  7.BTL  8.has been given information for Advance Directives 9.flu vaccine given  10.anemia related to chemo and pelvic RT. Not obviously symptomatic with limited activity now. Follow.  Patient understands discussion and recommendations. She will call prior to next scheduled visit if needed. Chemo orders confirmed. Time spent 35 min including >50% counseling and coordination of care.   Jody Beard P, MD   04/20/2014, 9:05 AM

## 2014-04-20 NOTE — Progress Notes (Signed)
Jody Beard has received 30 fractions to the vulva region.  She currently denies any pain since she applied a combination of silvadene and lidocaine gel.  Prior to application graded pain as a level 5/10. 1 diarrheal episode on Saturday.  Note spots of blood on tissue when wiping.  Denies any dysuria or difficultly voiding.  Reports fatigue and taking frequent naps.

## 2014-04-20 NOTE — Patient Instructions (Signed)
[facility] Discharge Instructions for Patients Receiving Chemotherapy  Today you received the following chemotherapy agents Cisplatin  To help prevent nausea and vomiting after your treatment, we encourage you to take your nausea medication as needed   If you develop nausea and vomiting that is not controlled by your nausea medication, call the clinic.   BELOW ARE SYMPTOMS THAT SHOULD BE REPORTED IMMEDIATELY:  *FEVER GREATER THAN 100.5 F  *CHILLS WITH OR WITHOUT FEVER  NAUSEA AND VOMITING THAT IS NOT CONTROLLED WITH YOUR NAUSEA MEDICATION  *UNUSUAL SHORTNESS OF BREATH  *UNUSUAL BRUISING OR BLEEDING  TENDERNESS IN MOUTH AND THROAT WITH OR WITHOUT PRESENCE OF ULCERS  *URINARY PROBLEMS  *BOWEL PROBLEMS  UNUSUAL RASH Items with * indicate a potential emergency and should be followed up as soon as possible.  Feel free to call the clinic you have any questions or concerns. The clinic phone number is [phone].

## 2014-04-20 NOTE — Progress Notes (Signed)
Pt voided 300 cc prior to Cisplatin

## 2014-04-21 ENCOUNTER — Encounter: Payer: Self-pay | Admitting: Radiation Oncology

## 2014-04-21 ENCOUNTER — Ambulatory Visit
Admission: RE | Admit: 2014-04-21 | Discharge: 2014-04-21 | Disposition: A | Discharge status: Home / Self Care | Payer: BC Managed Care – PPO | Source: Ambulatory Visit | Attending: Radiation Oncology | Admitting: Radiation Oncology

## 2014-04-21 ENCOUNTER — Ambulatory Visit: Payer: BC Managed Care – PPO

## 2014-04-21 DIAGNOSIS — Z510 Encounter for antineoplastic radiation therapy: Secondary | ICD-10-CM | POA: Diagnosis not present

## 2014-04-21 NOTE — Progress Notes (Signed)
IMRT DEVICE NOTE - vulvar cancer  8.0 delivered field widths represent one IMRT treatment device. The code is [phone].   IMRT planning Note  IMRT plan was devised and approved by me to boost her gross disease to an additional 6 Gy in 3 fractions to decrease risk of local failure and need for surgery.  IMRT spares her femoral heads, bowel, bladder.  -----------------------------------  [name], MD

## 2014-04-22 ENCOUNTER — Ambulatory Visit: Payer: BC Managed Care – PPO

## 2014-04-22 ENCOUNTER — Ambulatory Visit
Admission: RE | Admit: 2014-04-22 | Discharge: 2014-04-22 | Disposition: A | Discharge status: Home / Self Care | Payer: BC Managed Care – PPO | Source: Ambulatory Visit | Attending: Radiation Oncology | Admitting: Radiation Oncology

## 2014-04-22 DIAGNOSIS — Z510 Encounter for antineoplastic radiation therapy: Secondary | ICD-10-CM | POA: Diagnosis not present

## 2014-04-23 ENCOUNTER — Ambulatory Visit
Admission: RE | Admit: 2014-04-23 | Discharge: 2014-04-23 | Disposition: A | Discharge status: Home / Self Care | Payer: BC Managed Care – PPO | Source: Ambulatory Visit | Attending: Radiation Oncology | Admitting: Radiation Oncology

## 2014-04-23 ENCOUNTER — Encounter: Payer: Self-pay | Admitting: Radiation Oncology

## 2014-04-23 ENCOUNTER — Encounter: Payer: Self-pay | Admitting: Oncology

## 2014-04-23 ENCOUNTER — Ambulatory Visit: Payer: BC Managed Care – PPO

## 2014-04-23 DIAGNOSIS — Z510 Encounter for antineoplastic radiation therapy: Secondary | ICD-10-CM | POA: Diagnosis not present

## 2014-04-23 NOTE — Progress Notes (Signed)
She was approved for her ondansetron # 90 in a 90 day period.  It is valid from 03/24/14 until 04/23/2015.  Case ID [id number]. Called [facility] in [place] spoke to Wiggins to let them know.  Left a message for the patient.

## 2014-04-24 ENCOUNTER — Ambulatory Visit: Payer: BC Managed Care – PPO

## 2014-04-26 ENCOUNTER — Telehealth: Payer: Self-pay | Admitting: Oncology

## 2014-04-26 ENCOUNTER — Other Ambulatory Visit: Payer: Self-pay | Admitting: Oncology

## 2014-04-26 DIAGNOSIS — C519 Malignant neoplasm of vulva, unspecified: Secondary | ICD-10-CM

## 2014-04-26 NOTE — Progress Notes (Signed)
  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   Moist desquamation was addressed with pain medication, Silvadene, sitz baths.   Plan: The patient has completed radiation treatment. The patient will return to radiation oncology clinic for routine followup in one month. I advised them to call or return sooner if they have any questions or concerns related to their recovery or treatment.  -----------------------------------  [name], MD

## 2014-04-26 NOTE — Telephone Encounter (Signed)
lvm for pt that MD wants her to come 12.22....i did not cx 12.28...advised pt to call back to confirm is 12.22 ok...then i will cx 12.28

## 2014-04-27 ENCOUNTER — Telehealth: Payer: Self-pay | Admitting: Oncology

## 2014-04-27 ENCOUNTER — Ambulatory Visit (INDEPENDENT_AMBULATORY_CARE_PROVIDER_SITE_OTHER): Payer: BC Managed Care – PPO | Admitting: Neurology

## 2014-04-27 ENCOUNTER — Telehealth: Payer: Self-pay | Admitting: *Deleted

## 2014-04-27 ENCOUNTER — Encounter: Payer: Self-pay | Admitting: Neurology

## 2014-04-27 VITALS — BP 100/65 | HR 90 | Ht 62.0 in | Wt 118.0 lb

## 2014-04-27 DIAGNOSIS — G459 Transient cerebral ischemic attack, unspecified: Secondary | ICD-10-CM | POA: Insufficient documentation

## 2014-04-27 DIAGNOSIS — I499 Cardiac arrhythmia, unspecified: Secondary | ICD-10-CM

## 2014-04-27 DIAGNOSIS — C799 Secondary malignant neoplasm of unspecified site: Secondary | ICD-10-CM

## 2014-04-27 NOTE — Patient Instructions (Signed)
Overall you are doing fairly well but I do want to suggest a few things today:   Remember to drink plenty of fluid, eat healthy meals and do not skip any meals. Try to eat protein with a every meal and eat a healthy snack such as fruit or nuts in between meals. Try to keep a regular sleep-wake schedule and try to exercise daily, particularly in the form of walking, 20-30 minutes a day, if you can.   As far as your medications are concerned, I would like to suggest; Daily baby aspirin for stroke prevention  As far as diagnostic testing: MRI brain, MRA vessels, Carotid Dopplers, Echo, labs  I would like to see you back in 3 months, sooner if we need to. Please call us with any interim questions, concerns, problems, updates or refill requests.   Please also call us for any test results so we can go over those with you on the phone.  My clinical assistant and will answer any of your questions and relay your messages to me and also relay most of my messages to you.   Our phone number is [phone]. We also have an after hours call service for urgent matters and there is a physician on-call for urgent questions. For any emergencies you know to call 911 or go to the nearest emergency room

## 2014-04-27 NOTE — Telephone Encounter (Signed)
per msg left for Am...12.22 appt is ok....12.28 cx.Marland Kitchen

## 2014-04-27 NOTE — Telephone Encounter (Signed)
-----   Message from Gordy Levan, MD sent at 04/26/2014 11:32 AM EST ----- Please call to check on her on 12-14, as she may need IVF - one liter NS with 20 KCL this week if so  Please let her know that I am asking schedulers to move her apt from 12-28 to 12-22 if that is ok with her. She has chest CT on 12-21. POF for MD done.  thanks

## 2014-04-27 NOTE — Telephone Encounter (Signed)
Spoke with patient, states she is doing well-no need for IVF. Pt will call us if she starts feeling poorly. Reminded of appts. Pt not aware of CT scan- is OK with having it done on 12/21.

## 2014-04-27 NOTE — Progress Notes (Signed)
[facility]    Provider:  Dr Jaynee Eagles Referring Provider: Gordy Levan, MD Primary Care Physician:  No PCP Per Patient  CC:  TIA  HPI:  Jody Beard is a 63 y.o. female here as a referral from Dr. Marko Plume for follow up of possible TIA. 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 This happened 10 days ago. Lasted 5-10 minutes. No recent illnesses. She is getting chemo. She told her physician and they wanted her evaluated for TIA. Since decreasing the medication she has been fine, no more similar episodes. She used to have bad migraines and once she had numbness on the left side of the face, MRI was normal and was dxed with complications from migraine.  Currently denies focal weakness, numbness, paresthesias, vision changes, hearing changes, speech changes. She is sensitive to medications and thinks it was the pain pills. No speech difficulties. No SOB, no CP, no LOC, no other episodes of confusion or seizure-like events. No history of strokes. No current focal neurologic deficits.  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 Recently in radiation.   Diagnosed by Dr Elonda Husky on 02-03-14, clinically with vulvar carcinoma. 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 Ct showed No acute intracranial abnormalities including mass lesion or mass effect, hydrocephalus, extra-axial fluid collection, midline shift, hemorrhage, or acute infarction, large ischemic events (personally reviewed images)     Review of Systems: Patient complains of symptoms per HPI as well as the following symptoms No CP, No SOB. Pertinent negatives per HPI. All others negative.   History   Social History  . Marital Status: Married    Spouse Name: Fraser Din    Number of Children: 2  . Years of Education: 12   Occupational History  . Not on file.   Social History Main Topics  . Smoking status: Former Smoker -- 1.00 packs/day for 30 years    Types: Cigarettes    Quit date: 07/13/2013  . Smokeless tobacco: Never Used     Comment: current e-cig  . Alcohol Use: No     Comment: rare  . Drug Use: No  . Sexual Activity: Not on file   Other Topics Concern  . Not on file   Social History Narrative   Patient lives at home with Micheline Rough her husband   Patient has a high school education   Patient has 2 children    Patient is right handed    Patient is on leave of absence from [employer]     Family History  Problem Relation Age of Onset  . Heart disease Mother   . Kidney cancer Father   . Prostate cancer Cousin     Past Medical History  Diagnosis Date  . Vulvar lesion   .  Eczema   . H/O bronchitis   . Headache(784.0)     migraines years ago  . Vulvar cancer 01/2014    future radiation and chemo  . Cancer 01/2014    vulvar    Past Surgical History  Procedure Laterality Date  . Vulva /perineum biopsy  02/05/14  . Tubal ligation  1980's  . Dilation and curettage of uterus  1980's    Current Outpatient Prescriptions  Medication Sig Dispense Refill  . acyclovir (ZOVIRAX) 200 MG capsule Take 1 capsule (200 mg total) by mouth 3 (three) times daily. 90 capsule 0  . diphenhydrAMINE (BENADRYL) 25 mg capsule Take 25 mg by mouth every 6 (six) hours as  needed.    . loperamide (IMODIUM A-D) 2 MG tablet Take 2 mg by mouth 4 (four) times daily as needed for diarrhea or loose stools.    Marland Kitchen LORazepam (ATIVAN) 0.5 MG tablet Place 1/2-1 tablet under the tongue or swallow every 6 hrs as needed for nausea. Will make you drowsy. 20 tablet 0  . silver sulfADIAZINE (SILVADENE) 1 % cream Apply 1 application topically daily. Or as directed 85 g 3  . traMADol (ULTRAM) 50 MG tablet Take 1 tablet (50 mg total) by mouth every 8 (eight) hours as needed. 90 tablet 1   No current facility-administered medications for this visit.    Allergies as of 04/27/2014 - Review Complete 04/27/2014  Allergen Reaction Noted  . Other Rash 02/19/2014  . 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 Need to rule out ischemic event. Given history, also need to rule out metastasis to the brain. Will perform a stroke workup, will add contrast to MRI of the brain given hx of cancer. F/u after workup.    MRI of brain w/wo contrast MRA of head Carotid dopplers Echo to eval for cardiogenic source Lipid panel, fasting labs hgba1c labs Baby aspirin daily unless contraindicated    Sarina Ill, MD  [facility] [address] [place], [place] [address]  Phone [phone] Fax [phone]

## 2014-04-28 LAB — LIPID PANEL
CHOL/HDL RATIO: 4.8 ratio — AB (ref 0.0–4.4)
Cholesterol, Total: 228 mg/dL — ABNORMAL HIGH (ref 100–199)
HDL: 48 mg/dL (ref >39)
LDL Calculated: 133 mg/dL — ABNORMAL HIGH (ref 0–99)
Triglycerides: 236 mg/dL — ABNORMAL HIGH (ref 0–149)
VLDL CHOLESTEROL CAL: 47 mg/dL — AB (ref 5–40)

## 2014-04-28 LAB — HEMOGLOBIN A1C
Est. average glucose Bld gHb Est-mCnc: 128 mg/dL
Hgb A1c MFr Bld: 6.1 % — ABNORMAL HIGH (ref 4.8–5.6)

## 2014-04-28 NOTE — Addendum Note (Signed)
Addended by: Sarina Ill B on: 04/28/2014 08:34 AM   Modules accepted: Orders

## 2014-04-30 ENCOUNTER — Ambulatory Visit (INDEPENDENT_AMBULATORY_CARE_PROVIDER_SITE_OTHER): Payer: BC Managed Care – PPO

## 2014-04-30 DIAGNOSIS — G459 Transient cerebral ischemic attack, unspecified: Secondary | ICD-10-CM

## 2014-04-30 DIAGNOSIS — C799 Secondary malignant neoplasm of unspecified site: Secondary | ICD-10-CM

## 2014-04-30 MED ORDER — GADOPENTETATE DIMEGLUMINE 469.01 MG/ML IV SOLN: 10.0000 mL | Freq: Once | INTRAVENOUS | Status: AC | PRN

## 2014-05-03 ENCOUNTER — Other Ambulatory Visit: Payer: Self-pay | Admitting: Oncology

## 2014-05-04 ENCOUNTER — Other Ambulatory Visit (HOSPITAL_COMMUNITY): Payer: Self-pay | Admitting: Neurology

## 2014-05-04 ENCOUNTER — Ambulatory Visit (HOSPITAL_BASED_OUTPATIENT_CLINIC_OR_DEPARTMENT_OTHER): Payer: BC Managed Care – PPO | Admitting: Radiology

## 2014-05-04 ENCOUNTER — Ambulatory Visit (HOSPITAL_COMMUNITY)
Admission: RE | Admit: 2014-05-04 | Discharge: 2014-05-04 | Disposition: A | Discharge status: Home / Self Care | Payer: BC Managed Care – PPO | Source: Ambulatory Visit | Attending: Oncology | Admitting: Oncology

## 2014-05-04 ENCOUNTER — Other Ambulatory Visit: Payer: Self-pay | Admitting: Neurology

## 2014-05-04 ENCOUNTER — Encounter (HOSPITAL_COMMUNITY): Payer: Self-pay

## 2014-05-04 DIAGNOSIS — I499 Cardiac arrhythmia, unspecified: Secondary | ICD-10-CM

## 2014-05-04 DIAGNOSIS — C519 Malignant neoplasm of vulva, unspecified: Secondary | ICD-10-CM | POA: Insufficient documentation

## 2014-05-04 DIAGNOSIS — G459 Transient cerebral ischemic attack, unspecified: Secondary | ICD-10-CM

## 2014-05-04 DIAGNOSIS — Z923 Personal history of irradiation: Secondary | ICD-10-CM | POA: Insufficient documentation

## 2014-05-04 DIAGNOSIS — Z9221 Personal history of antineoplastic chemotherapy: Secondary | ICD-10-CM | POA: Insufficient documentation

## 2014-05-04 DIAGNOSIS — C3432 Malignant neoplasm of lower lobe, left bronchus or lung: Secondary | ICD-10-CM

## 2014-05-04 DIAGNOSIS — R911 Solitary pulmonary nodule: Secondary | ICD-10-CM | POA: Diagnosis not present

## 2014-05-04 MED ORDER — ATORVASTATIN CALCIUM 10 MG PO TABS: 10.0000 mg | ORAL_TABLET | Freq: Every day | ORAL | Status: DC

## 2014-05-04 MED ORDER — IOHEXOL 300 MG/ML  SOLN
80.0000 mL | Freq: Once | INTRAMUSCULAR | Status: AC | PRN
  Administered 2014-05-04: 80 mL via INTRAVENOUS

## 2014-05-04 NOTE — Progress Notes (Signed)
Spoke to patient. Reviewed MRI of the brain, MRA of the head and echo. All unremarkable. She should continue asa 81mg  and will start lipitor 10mg  qhs for LDL of 133 (goal < 100). Her hgba1c 6.1, explained this is not diabetic but is at increased risk. Should watch diet, limit carbs and sugars. She has carotid dopplers scheduled later this month.

## 2014-05-04 NOTE — Progress Notes (Signed)
Echocardiogram performed with bubble study.

## 2014-05-05 ENCOUNTER — Ambulatory Visit (HOSPITAL_BASED_OUTPATIENT_CLINIC_OR_DEPARTMENT_OTHER): Payer: BC Managed Care – PPO | Admitting: Lab

## 2014-05-05 ENCOUNTER — Ambulatory Visit (HOSPITAL_BASED_OUTPATIENT_CLINIC_OR_DEPARTMENT_OTHER): Payer: BC Managed Care – PPO | Admitting: Oncology

## 2014-05-05 ENCOUNTER — Encounter: Payer: Self-pay | Admitting: Oncology

## 2014-05-05 ENCOUNTER — Other Ambulatory Visit: Payer: Self-pay | Admitting: Oncology

## 2014-05-05 ENCOUNTER — Telehealth: Payer: Self-pay | Admitting: Oncology

## 2014-05-05 VITALS — BP 93/54 | HR 81 | Temp 98.3°F | Resp 18 | Ht 62.0 in | Wt 119.9 lb

## 2014-05-05 DIAGNOSIS — C3492 Malignant neoplasm of unspecified part of left bronchus or lung: Secondary | ICD-10-CM

## 2014-05-05 DIAGNOSIS — F17201 Nicotine dependence, unspecified, in remission: Secondary | ICD-10-CM

## 2014-05-05 DIAGNOSIS — D6489 Other specified anemias: Secondary | ICD-10-CM

## 2014-05-05 DIAGNOSIS — C3432 Malignant neoplasm of lower lobe, left bronchus or lung: Secondary | ICD-10-CM

## 2014-05-05 DIAGNOSIS — D509 Iron deficiency anemia, unspecified: Secondary | ICD-10-CM

## 2014-05-05 DIAGNOSIS — C519 Malignant neoplasm of vulva, unspecified: Secondary | ICD-10-CM

## 2014-05-05 DIAGNOSIS — C774 Secondary and unspecified malignant neoplasm of inguinal and lower limb lymph nodes: Secondary | ICD-10-CM

## 2014-05-05 DIAGNOSIS — D6481 Anemia due to antineoplastic chemotherapy: Secondary | ICD-10-CM

## 2014-05-05 DIAGNOSIS — T451X5A Adverse effect of antineoplastic and immunosuppressive drugs, initial encounter: Secondary | ICD-10-CM

## 2014-05-05 DIAGNOSIS — D630 Anemia in neoplastic disease: Secondary | ICD-10-CM

## 2014-05-05 LAB — CBC WITH DIFFERENTIAL/PLATELET
BASO%: 0.5 % (ref 0.0–2.0)
BASOS ABS: 0 10*3/uL (ref 0.0–0.1)
EOS%: 1.5 % (ref 0.0–7.0)
Eosinophils Absolute: 0.1 10*3/uL (ref 0.0–0.5)
HCT: 26.1 % — ABNORMAL LOW (ref 34.8–46.6)
HGB: 8.4 g/dL — ABNORMAL LOW (ref 11.6–15.9)
LYMPH%: 23.8 % (ref 14.0–49.7)
MCH: 29.3 pg (ref 25.1–34.0)
MCHC: 32.4 g/dL (ref 31.5–36.0)
MCV: 90.5 fL (ref 79.5–101.0)
MONO#: 0.5 10*3/uL (ref 0.1–0.9)
MONO%: 11.6 % (ref 0.0–14.0)
NEUT#: 2.6 10*3/uL (ref 1.5–6.5)
NEUT%: 62.6 % (ref 38.4–76.8)
Platelets: 216 10*3/uL (ref 145–400)
RBC: 2.88 10*6/uL — ABNORMAL LOW (ref 3.70–5.45)
RDW: 20 % — AB (ref 11.2–14.5)
WBC: 4.2 10*3/uL (ref 3.9–10.3)
lymph#: 1 10*3/uL (ref 0.9–3.3)

## 2014-05-05 LAB — COMPREHENSIVE METABOLIC PANEL (CC13)
ALT: 10 U/L (ref 0–55)
ANION GAP: 9 meq/L (ref 3–11)
AST: 12 U/L (ref 5–34)
Albumin: 3 g/dL — ABNORMAL LOW (ref 3.5–5.0)
Alkaline Phosphatase: 116 U/L (ref 40–150)
BILIRUBIN TOTAL: 0.2 mg/dL (ref 0.20–1.20)
BUN: 11.5 mg/dL (ref 7.0–26.0)
CHLORIDE: 103 meq/L (ref 98–109)
CO2: 25 meq/L (ref 22–29)
Calcium: 9 mg/dL (ref 8.4–10.4)
Creatinine: 0.8 mg/dL (ref 0.6–1.1)
EGFR: 84 mL/min/{1.73_m2} — ABNORMAL LOW (ref >90)
Glucose: 109 mg/dl (ref 70–140)
Potassium: 3.7 mEq/L (ref 3.5–5.1)
Sodium: 137 mEq/L (ref 136–145)
TOTAL PROTEIN: 6.2 g/dL — AB (ref 6.4–8.3)

## 2014-05-05 LAB — MAGNESIUM (CC13): MAGNESIUM: 1.8 mg/dL (ref 1.5–2.5)

## 2014-05-05 LAB — TECHNOLOGIST REVIEW

## 2014-05-05 NOTE — Progress Notes (Signed)
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 She received radiation by Dr Isidore Moos from 10-26 thru 04-23-14, given with sensitizing CDDP x 7 from 10-26 thru 04-20-14. She had repeat CT chest 05-04-14; she is for PET in Jan prior to visit back to gyn oncology.  She noticed scattered rash since CT, minimally puritic, left hand, buttocks, possibly lower face.  Patient has recently seen Dr Sarina Ill of neurology due to concern about possible TIAs. Brain MRI 05-01-14 showed no metastatic disease; she has also had brain MRA, bubble echocardiogram and is for carotid dopplers. She has had no further episodes of the speech slurring/  Numbness lip and left fingers.  Patient is progressively more comfortable over perineum, tho still ulcerated areas and tenderness mostly anteriorly. She has not used pain medication or nausea meds in last few days, is still using topical barrier cream + viscous lidocaine. She denies SOB or cough. Appetite is improving, enjoyed pizza. No diarrhea.   No PAC Flu vaccine done  ONCOLOGIC HISTORY  Patient presented with gradually enlarging mass at right vulva for ~ 8 months, uncomfortable with direct pressure and ulcerated with occasional bleeding. She was seen in ED on 02-02-14 with right inguinal adenopathy and right vulvar masses, then seen by Dr Elonda Husky on 02-03-14, clinically with vulvar carcinoma. She was then seen in consultation by Dr Skeet Latch on 02-05-14, with biopsy of both vulvar mass and satellite lesions on inner thigh and mons. Her exam found 7 cm right vulvar mass with 4 cm extension to distal vagina, extending anteriorly to urethral meatus and in ischiorectal area, with satellite lesions inner  thigh and right mons. Pathology [phone]) showed invasive squamous cell carcinoma arising in background of high grade squamous intraepithelial lesion from vulva biopsy and invasive moderately differentiated squamous cell carcinoma from inner thigh lesion. CXR 02-06-14 showed left lower lobe lung mass 3.6 x 3.6 x 3.6 cm, with no adenopathy and no bony lesions, emphysematous changes. CT chest 02-09-14 showed the lower lobe mass 3.7 x 3.5 x 3.3 cm as well as 3 mm RLL nodule and faint 4 mm LUL nodule, no mediastinal or hilar nodes, no axillary or supraclavicular nodes, and upper abdomen not remarkable. PET 02-18-14 measured LLL lung mass 4.6 x 3.9 cm, hypermetabolic, and noted multiple subcentimeter nodules scattered thruout lungs bilaterally; the right vulvar mass was also hypermetabolic, extending up into right side of lower vagina, extensive R>L and right external iliac adenopathy, no uptake in skeleton. 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 After initial consideration of bilateral inguinal lymphadenectomy, plan subsequently changed to radiation with sensitizing CDDP. She had 3 stereotactic body radiation treatments to the LLL pulmonary mass in 02-2014. She began IMRT to vulvar area on 03-09-14, completed 04-23-14, with weekly CDDP as radiation sensitizer, 7 cycles of CDDP 03-09-14 thru 04-20-14. Follow up CT chest 05-04-14 had cavitary, spiculated LLL mass 3.3 x 2.3 cm, having been 4.6 x 3.9 cm prior to IMRT, and no change in scattered small bilateral pulmonary nodules.     Review of systems as above, also: No fever or symptoms of infection. Is able to sleep. No LE swelling. Remainder of 10 point Review of Systems negative.  Objective:  Vital signs in last 24 hours:  BP 93/54 mmHg  Pulse 81  Temp(Src) 98.3 F (36.8 C) (Oral)  Resp 18  Ht 5\' 2"  (1.575 m)  Wt 119 lb 14.4 oz (54.386 kg)  BMI 21.92 kg/m2 Weight up 2  lbs. Alert, oriented and appropriate. Ambulatory without assistance. Positioning on exam table still difficult. No alopecia  HEENT:PERRL, sclerae not icteric. Oral mucosa moist without lesions, posterior pharynx clear.  Neck supple. No JVD.  Lymphatics:no cervical,supraclavicular, axillary or left inguinal adenopathy. Right inguinal node smaller but still easily palpable, firm, not fixed ~  1.5 x 2 cm  Resp: Breath sounds diminished thruout without wheezes or rales and no dullness to percussion bilaterally Cardio: regular rate and rhythm. No gallop. GI: soft, nontender, not distended, no mass or organomegaly. Normally active bowel sounds.  Perineum with more intact skin especially in inguinal areas and posteriorly, still superficial ulcerations scattered.  Musculoskeletal/ Extremities: without pitting edema, cords, tenderness Neuro: no peripheral neuropathy. Otherwise nonfocal Skin slightly pustular erythematous papule dorsum left hand, scattered right buttock, 2 areas lower face, not vesicular. Otherwise without ecchymosis, petechiae. Metastatic area right inner upper thigh less prominent.   Lab Results:  Results for orders placed or performed in visit on 05/05/14  TECHNOLOGIST REVIEW  Result Value Ref Range   Technologist Review Metas and Myelocytes present   CBC with Differential  Result Value Ref Range   WBC 4.2 3.9 - 10.3 10e3/uL   NEUT# 2.6 1.5 - 6.5 10e3/uL   HGB 8.4 (L) 11.6 - 15.9 g/dL   HCT 26.1 (L) 34.8 - 46.6 %   Platelets 216 145 - 400 10e3/uL   MCV 90.5 79.5 - 101.0 fL   MCH 29.3 25.1 - 34.0 pg   MCHC 32.4 31.5 - 36.0 g/dL   RBC 2.88 (L) 3.70 - 5.45 10e6/uL   RDW 20.0 (H) 11.2 - 14.5 %   lymph# 1.0 0.9 - 3.3 10e3/uL   MONO# 0.5 0.1 - 0.9 10e3/uL   Eosinophils Absolute 0.1 0.0 - 0.5 10e3/uL   Basophils Absolute 0.0 0.0 - 0.1 10e3/uL   NEUT% 62.6 38.4 - 76.8 %   LYMPH% 23.8 14.0 - 49.7 %   MONO% 11.6 0.0 - 14.0 %   EOS% 1.5 0.0 - 7.0 %   BASO% 0.5 0.0 - 2.0 %     CMET and Mg available after visit have K 3.7, creat 0.8, T prot 6.2, alb 3.0 and Mg 1.8.   Studies/Results:  Ct Chest W Contrast  05/04/2014   CLINICAL DATA:  Vulvar cancer diagnosed 02/2014, chemotherapy and XRT complete, follow-up left lower lobe lung mass (metastases versus lung cancer)  EXAM: CT CHEST WITH CONTRAST  TECHNIQUE: Multidetector CT imaging of the chest was performed during intravenous contrast administration.  CONTRAST:  65mL OMNIPAQUE IOHEXOL 300 MG/ML  SOLN  COMPARISON:  PET-CT dated 02/18/2014  FINDINGS: 3.3 x 2.3 cm cavitary, spiculated left lower lobe mass (series 5/ image 42), previously 4.6 x 3.9 cm. Surrounding mild ground-glass opacity.  Two adjacent 3 mm nodules in the left lower lobe (series 5/ image 35), unchanged. 5 mm nodule in the right lower lobe (series 5/ image 44), unchanged. Two 2 mm nodules in the right lower lobe (series 5/image 47), unchanged. 3 mm nodule in the posterior right upper lobe (series 5/image 21), unchanged.  Mild centrilobular emphysematous changes. No pleural effusion or pneumothorax.  Visualized thyroid is unremarkable.  The heart is normal in size. No pericardial effusion. Mild atherosclerotic calcifications of the  aortic arch.  No suspicious mediastinal, hilar, or axillary lymphadenopathy.  Visualized upper abdomen is unremarkable.  Mild degenerative changes of the visualized thoracolumbar spine.  IMPRESSION: 3.3 cm cavitary, spiculated left lower lobe mass, decreased.  Scattered bilateral pulmonary nodules measuring up to 5 mm in the right lower lobe, unchanged.   Electronically Signed   By: Julian Hy M.D.   On: 05/04/2014 08:59   PACs images reviewed by MD.   Jasmine December: MRI brain (with and without) 05-01-14 TECHNIQUE: MRI of the brain with and without contrast was obtained utilizing 5 mm axial slices with T1, T2, T2 flair, T2 star gradient echo and diffusion weighted views. T1 sagittal, T2 coronal and postcontrast views in the axial and  coronal plane were obtained. CONTRAST: 10ml magnevist  IMAGING SITE: [facility] [address] (1.5 Tesla MRI)   FINDINGS:  No abnormal lesions are seen on diffusion-weighted views to suggest acute ischemia. The cortical sulci, fissures and cisterns are normal in size and appearance. Lateral, third and fourth ventricle are normal in size and appearance. No extra-axial fluid collections are seen. No evidence of mass effect or midline shift. Few minimal, punctate juxtacortical and subcortical T2 hyperintensities.   No abnormal lesions are seen on post contrast views. On sagittal views the posterior fossa, pituitary gland and corpus callosum are unremarkable. No evidence of intracranial hemorrhage on gradient-echo views. The orbits and their contents, paranasal sinuses and calvarium are unremarkable. Intracranial flow voids are present.   IMPRESSION:  Equivocal MRI brain (with and without) demonstrating: 1. Few minimal, punctate juxtacortical and subcortical T2 hyperintensities. These findings are non-specific and considerations include autoimmune, inflammatory, post-infectious, microvascular ischemic or migraine associated etiologies or normal variant. 2. No acute findings. No abnormal enhancing lesions.    Medications: I have reviewed the patient's current medications. Will begin iron as Hemocyte or ferrous fumarate, daily on empty stomach with OJ.  She can stop prophylactic acyclovir when completes present prescription.   DISCUSSION: CT chest results discussed: no change in scattered bilateral lung nodules all <1 cm suggests these may not be malignant. Biopsy proven squamous cell carcinoma in LLL smaller since IMRT. Will reassess with PET requested shortly prior to seeing Drs Denman George and Isidore Moos on 05-29-13. Until then, patient hopefully will continue to improve from treatment recently completed.  Anemia discussed, appears related to pelvic RT and chemo. Add oral iron, call if more symptomatic  piror to next visit/ labs. I will see her back ~ Jan 21 or sooner if needed.  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 Improved local disease with RT/ sensitizing CDDP, recovering from skin reaction. Continue local measures for comfort, improve nutrition as possible, PET requested prior to follow up as above.  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 Bilateral subcentimeter pulmonary nodules stable compared to CT chest 01-2014 so may not be malignant. PET information will be helpful considering options from here. 3.transient speech difficulty with numbness of lip and fingers 3-4-5 left hand resolved after ~ 15 min without interventions. Neurology evaluation in process, no metastatic disease found. On ASA 4. BP somewhat low, which seems to be baseline, not on antihypertensives. Continue to push po fluids and follow,  5.history migraine HAs. No HA with zofran.  6.allergy PCN  7.BTL  8.has been given information for Advance Directives 9.flu vaccine given  10.anemia related to chemo and pelvic RT.Add oral iron, follow. 11.living will done 12.scattered skin rash: etiology not clear. OK to try benadryl, call if worse. Does not look typical to me for IV contrast reaction.  All questions answered and she knows to call prior to next scheduled visit if needed. Time spent 25 min including >50% counseling and coordination of care.    Zanaria Morell P, MD   05/05/2014, 2:09 PM

## 2014-05-05 NOTE — Telephone Encounter (Signed)
per pof to sch pt appt-sent LL email to add PET order-cld pt & left message of appt-mailed copy of sch

## 2014-05-06 DIAGNOSIS — T451X5A Adverse effect of antineoplastic and immunosuppressive drugs, initial encounter: Secondary | ICD-10-CM

## 2014-05-06 DIAGNOSIS — D6489 Other specified anemias: Secondary | ICD-10-CM | POA: Insufficient documentation

## 2014-05-06 DIAGNOSIS — D6481 Anemia due to antineoplastic chemotherapy: Secondary | ICD-10-CM | POA: Insufficient documentation

## 2014-05-06 MED ORDER — FERROUS FUMARATE 325 (106 FE) MG PO TABS: ORAL_TABLET | ORAL | Status: DC

## 2014-05-10 ENCOUNTER — Other Ambulatory Visit: Payer: Self-pay | Admitting: Oncology

## 2014-05-11 ENCOUNTER — Other Ambulatory Visit: Payer: BC Managed Care – PPO

## 2014-05-11 ENCOUNTER — Ambulatory Visit: Payer: BC Managed Care – PPO | Admitting: Oncology

## 2014-05-13 ENCOUNTER — Ambulatory Visit (INDEPENDENT_AMBULATORY_CARE_PROVIDER_SITE_OTHER): Payer: BC Managed Care – PPO

## 2014-05-13 DIAGNOSIS — G459 Transient cerebral ischemic attack, unspecified: Secondary | ICD-10-CM

## 2014-05-26 ENCOUNTER — Encounter: Payer: Self-pay | Admitting: *Deleted

## 2014-05-26 ENCOUNTER — Telehealth: Payer: Self-pay | Admitting: Neurology

## 2014-05-26 NOTE — Telephone Encounter (Signed)
Jody Beard - Please let patient know her carotid doppler studies were within normal limits. Thank you.

## 2014-05-27 ENCOUNTER — Encounter (HOSPITAL_COMMUNITY): Payer: Self-pay

## 2014-05-27 ENCOUNTER — Ambulatory Visit (HOSPITAL_COMMUNITY)
Admission: RE | Admit: 2014-05-27 | Discharge: 2014-05-27 | Disposition: A | Discharge status: Home / Self Care | Payer: 59 | Source: Ambulatory Visit | Attending: Oncology | Admitting: Oncology

## 2014-05-27 DIAGNOSIS — C519 Malignant neoplasm of vulva, unspecified: Secondary | ICD-10-CM | POA: Diagnosis not present

## 2014-05-27 DIAGNOSIS — C3432 Malignant neoplasm of lower lobe, left bronchus or lung: Secondary | ICD-10-CM

## 2014-05-27 LAB — GLUCOSE, CAPILLARY: GLUCOSE-CAPILLARY: 123 mg/dL — AB (ref 70–99)

## 2014-05-27 MED ORDER — FLUDEOXYGLUCOSE F - 18 (FDG) INJECTION
5.8500 | Freq: Once | INTRAVENOUS | Status: AC | PRN
  Administered 2014-05-27: 5.85 via INTRAVENOUS

## 2014-05-27 NOTE — Telephone Encounter (Signed)
Called and left patient message that her studies were normal.

## 2014-05-29 ENCOUNTER — Ambulatory Visit: Payer: BC Managed Care – PPO | Admitting: Radiation Oncology

## 2014-05-29 ENCOUNTER — Ambulatory Visit: Payer: 59 | Attending: Gynecologic Oncology | Admitting: Gynecologic Oncology

## 2014-05-29 ENCOUNTER — Encounter: Payer: Self-pay | Admitting: Gynecologic Oncology

## 2014-05-29 VITALS — BP 114/58 | HR 104 | Temp 98.1°F | Resp 18 | Ht 62.0 in | Wt 123.1 lb

## 2014-05-29 DIAGNOSIS — C519 Malignant neoplasm of vulva, unspecified: Secondary | ICD-10-CM

## 2014-05-29 DIAGNOSIS — R918 Other nonspecific abnormal finding of lung field: Secondary | ICD-10-CM

## 2014-05-29 DIAGNOSIS — Z923 Personal history of irradiation: Secondary | ICD-10-CM | POA: Insufficient documentation

## 2014-05-29 DIAGNOSIS — Z87891 Personal history of nicotine dependence: Secondary | ICD-10-CM | POA: Insufficient documentation

## 2014-05-29 DIAGNOSIS — L258 Unspecified contact dermatitis due to other agents: Secondary | ICD-10-CM

## 2014-05-29 DIAGNOSIS — L259 Unspecified contact dermatitis, unspecified cause: Secondary | ICD-10-CM | POA: Insufficient documentation

## 2014-05-29 MED ORDER — METHYLPREDNISOLONE (PAK) 4 MG PO TABS: ORAL_TABLET | ORAL | Status: DC

## 2014-05-29 NOTE — Patient Instructions (Signed)
We will call you with the biopsy results from today. Followup with Dr. Denman George in 3 months. Please call us sooner with any questions or concerns.

## 2014-05-29 NOTE — Progress Notes (Signed)
Followup Note: Gyn-Onc  CC: Dr. Elonda Husky    CC:  Chief Complaint  Patient presents with  . Vulvar Cancer  contact dermatitis  Assessment/Plan:  Jody Beard  is a 64 y.o.  With a history of stage IV vulvar ca (S[facility]) and possible second primary lung cancer who is 6 weeks s/p completing primary chemoradiation. She appears to have had a mixed response clinically with resolution of the vulva, nodal, and left lower lung lesions however progression of the multiple bilateral pulmonary nodules.  I am recommending continuing chemotherapy with cisplatin and paclitaxel until she has a complete clinical response in the lungs.  We will follow up the biopsy from today, and if it shows complete pathologic response at the vulva will monitor for recurrence in the following months. However if it shows residual squamous cell carcinoma, we'll plan to re-biopsy if she has a complete response to chemotherapy with the metastatic lung lesions. I see no indication to perform a radical vulvectomy for Mr. Quackenbush while she has progressive disseminated malignancy and no adverse side effects from her vulva disease.  Medrol Dosepak for contact dermatitis.  I will see her again in 3 months, and at which time I believe she will be still undergoing chemotherapy, to monitor the vulva for persistence of effect versus recurrence/progression.  HPI:   Jody Beard is a 64 year old woman with a history of noticing a right vulvar mass that had been progressing since approximately fabric 2015. In September 2015 she was seen by Dr. Elonda Husky who performed a biopsy which confirmed squamous cell carcinoma of the vulva. The disease was extremely extensive on the vulva, with a 7cm vulvar lesion that extended additionally into the 4cmof the distal vagina,  the vaginal submucosa and the urethra.  Satellite lesions appreciated on the inner thigh and mons.  Bilateral inguinal adenopathy noted.  During preoperative evaluation for a planned inguinal  lymphadenectomy, and chest x-ray revealed a large 3 cm solid lung mass. A PET/CT was performed and was demonstrated extensive uptake in an enlarged inguinal and pelvic lymph nodes, as well as a left lower lobe lung mass that was avid and multiple bilateral pulmonary nodules. The chest mass was biopsied and confirmed to be squamous cell carcinoma. It was unclear if it was a second primary lung cancer, or a metastasis from her vulva.   Treatment planning determined that she would receive primary chemoradiation to control the disease at the vulva, inguinal and pelvic nodes, with cisplatin radiosensitizing chemotherapy. Her radiation treatment dates were between 03/09/2014 and 04/23/2014. She received 60 gray to the vulvovaginal and pelvic inguinal nodes, with a vulvovaginal and nodal boost using IMRT. She received concurrent radiosensitizing cisplatin chemotherapy.  She tolerated the radiation treatment relatively well with excellent clinical tumor regression on physical examination.  On 05/27/14 she received a PET scan which revealed good response in the pelvis, vulvar and left lower lobe, however there was interval increase in number and size of the numerous bilateral pulmonary nodules suggesting a mixed response to therapy.   She presents today for evaluation of response to therapy she presents today for evaluation of response to therapy and further treatment planning.  INTERVAL HX: The patient reports changing laundry detergent at Christmas time after which she experienced a widespread pruritic desquamating rash in a distribution consistent with contact dermatitis to her clothing. For this she's been using Benadryl orally which helps with the itch but has not helped with the tissue response.  Review of Systems:  Constitutional  Feels  well,  Cardiovascular  No chest pain, shortness of breath, or edema  Pulmonary  No cough or wheeze.  Gastro Intestinal  No nausea, vomitting, or diarrhoea. No  bright red blood per rectum, no abdominal pain, change in bowel movement, or constipation.  Genito Urinary  No frequency, urgency, dysuria,.  Reports discomfort sitting, no vaginal bleeding Musculo Skeletal  No myalgia, arthralgia, joint swelling or pain  Neurologic  No weakness, numbness, change in gait,  Psychology  No depression.  Patient reports anxiety about the diagnosis  Current Meds:  Outpatient Encounter Prescriptions as of 05/29/2014  Medication Sig  . diphenhydrAMINE (BENADRYL) 25 mg capsule Take 25 mg by mouth every 6 (six) hours as needed.  . ferrous fumarate (HEMOCYTE - 106 MG FE) 325 (106 FE) MG TABS tablet Take one tablet daily on empty stomach with OJ.  . loperamide (IMODIUM A-D) 2 MG tablet Take 2 mg by mouth 4 (four) times daily as needed for diarrhea or loose stools.  Marland Kitchen LORazepam (ATIVAN) 0.5 MG tablet Place 1/2-1 tablet under the tongue or swallow every 6 hrs as needed for nausea. Will make you drowsy.  . silver sulfADIAZINE (SILVADENE) 1 % cream Apply 1 application topically daily. Or as directed  . traMADol (ULTRAM) 50 MG tablet Take 1 tablet (50 mg total) by mouth every 8 (eight) hours as needed.  Marland Kitchen atorvastatin (LIPITOR) 10 MG tablet Take 1 tablet (10 mg total) by mouth at bedtime. (Patient not taking: Reported on 05/29/2014)  . methylPREDNIsolone (MEDROL DOSPACK) 4 MG tablet follow package directions  . [DISCONTINUED] acyclovir (ZOVIRAX) 200 MG capsule Take 1 capsule (200 mg total) by mouth 3 (three) times daily.    Allergy:  Allergies  Allergen Reactions  . Other Rash    Bleach Cleaner  . Penicillins Rash    Social Hx:   History   Social History  . Marital Status: Married    Spouse Name: Jody Beard    Number of Children: 2  . Years of Education: 12   Occupational History  . Not on file.   Social History Main Topics  . Smoking status: Former Smoker -- 1.00 packs/day for 30 years    Types: Cigarettes    Quit date: 07/13/2013  . Smokeless tobacco: Never  Used     Comment: current e-cig  . Alcohol Use: No     Comment: rare  . Drug Use: No  . Sexual Activity: No   Other Topics Concern  . Not on file   Social History Narrative   Patient lives at home with Micheline Rough her husband   Patient has a high school education   Patient has 2 children    Patient is right handed    Patient is on leave of absence from [employer]     Past Surgical Hx:  Past Surgical History  Procedure Laterality Date  . Vulva /perineum biopsy  02/05/14  . Tubal ligation  1980's  . Dilation and curettage of uterus  1980's    Past Medical Hx:  Past Medical History  Diagnosis Date  . Vulvar lesion   . Eczema   . H/O bronchitis   . Headache(784.0)     migraines years ago  . Vulvar cancer 01/2014    future radiation and chemo  . Cancer 01/2014    vulvar  . Hx of radiation therapy 03/09/14-04/23/14    vulva/vaginal, pelvic/inguinal nodes 60 Gy 30 fx, vulvar/vaginal nodal boost 6 Gy 3 fx    Past Gynecological History:  G2P2 last pap > 9 years ago. No LMP recorded. Patient is postmenopausal.  Family Hx:  Family History  Problem Relation Age of Onset  . Heart disease Mother   . Kidney cancer Father   . Prostate cancer Cousin     Vitals:  Blood pressure 114/58, pulse 104, temperature 98.1 F (36.7 C), temperature source Oral, resp. rate 18, height 5\' 2"  (1.575 m), weight 123 lb 1.6 oz (55.838 kg).  Physical Exam: WD in NAD Neck  Supple NROM, without any enlargements.  Lymph Node Survey No cervical or supraclavicular  Adenopathy. No palpable inguinal lymphadenopathy. Skin Widespread erythematous, desquamating rash on the abdomen, back, upper and lower extremities. The face and neck are spared. Cardiovascular  Pulse normal rate, regularity and rhythm. S1 and S2 normal.  Lungs  Clear to auscultation bilaterally, Good air movement.  Skin  No rash/lesions/breakdown  Psychiatry  Alert and oriented appropriate mood affect speech and reasoning. Abdomen   Normoactive bowel sounds, abdomen soft, non-tender. Surgical  sites intact without evidence of hernia.  Back No CVA tenderness Genito Urinary  Vulva/vagina:  There is an excellent response to radiation with no gross visible tumor or palpable tumor. There is some mild nodularity in the right labia majora and this was chosen as the biopsy site. Radiation changes are visible on the vulvar including tightness of skin, loss of hair, and atrophy. Extremities  No bilateral cyanosis, clubbing or edema.  VULVAR BIOPSIES The procedure was reviewed with the patient including risks and potential complications. The right labia majora was prepped with betadine. 3 cc of 1% lidocaine was used to anesthetize the areas. A 30mm punch was collected from the right labia majora. The bleeding was controlled with monsels. The patient tolerated the procedure with minimal discomfort. Donaciano Eva, MD 05/29/2014, 3:57 PM

## 2014-06-02 NOTE — Progress Notes (Signed)
Radiation Oncology         [phone] ________________________________  Name: Jody Beard MRN: [id number]  Date: 06/03/2014  DOB: [birth year]  Follow-Up Visit Note  outpatient  CC: No PCP Per Patient  Janie Morning, MD  Diagnosis and Prior Radiotherapy:    ICD-9-CM ICD-10-CM   1. 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  Her main complaint is a skin rash from detergent.  She reports perineal pain has mainly resolved.  Unfortunately restaging scans show progressive lung nodules c/w mets.  She is seeing med/onc to discuss chemotherapy in about a week.  Dr Denman George appreciated excellent response of the vulvar disease; biopsy results pending to r/o residual disease                              ALLERGIES:  is allergic to other and penicillins.  Meds: Current Outpatient Prescriptions  Medication Sig Dispense Refill  . diphenhydrAMINE (BENADRYL) 25 mg capsule Take 25 mg by mouth every 6 (six) hours as needed.    . ferrous fumarate (HEMOCYTE - 106 MG FE) 325 (106 FE) MG TABS tablet Take one tablet daily on empty stomach with OJ. 30 each 4  . methylPREDNIsolone (MEDROL DOSPACK) 4 MG tablet  follow package directions 21 tablet 0  . silver sulfADIAZINE (SILVADENE) 1 % cream Apply 1 application topically daily. Or as directed 85 g 3  . traMADol (ULTRAM) 50 MG tablet Take 1 tablet (50 mg total) by mouth every 8 (eight) hours as needed. 90 tablet 1  . atorvastatin (LIPITOR) 10 MG tablet Take 1 tablet (10 mg total) by mouth at bedtime. (Patient not taking: Reported on 06/03/2014) 30 tablet 11   No current facility-administered medications for this encounter.    Physical Findings: The patient is in no acute distress. Patient is alert and oriented.  weight is 127 lb 14.4 oz (58.015 kg). Her oral temperature is 97.8 F (36.6 C). Her blood pressure is 128/69 and her pulse is 75. Her respiration is 20. Marland Kitchen    Diffuse erythematous, dry skin rash over body.  Skin in pelvis/perineal fields has healed and is intact.  Slight nodularity over perineum/vulva remains. No groin masses.  Lab Findings: Lab Results  Component Value Date   WBC 4.2 05/05/2014   HGB 8.4* 05/05/2014   HCT 26.1* 05/05/2014   MCV 90.5 05/05/2014   PLT 216 05/05/2014  Radiographic Findings: Nm Pet Image Restag (ps) Skull Base To Thigh  05/27/2014   CLINICAL DATA:  Subsequent treatment strategy for vulvar cancer. Additional history of left lower lobe mass, biopsy-proven squamous cell carcinoma (favored to be primary based on pathology results). History of radiation therapy to the pelvis, and SBRT to the left lower lobe lesion.  EXAM: NUCLEAR MEDICINE PET SKULL BASE TO THIGH  TECHNIQUE: 5.85 mCi F-18 FDG was injected intravenously. Full-ring PET imaging was performed from the skull base to thigh after the radiotracer. CT data was obtained and used for attenuation correction and anatomic localization.  FASTING BLOOD GLUCOSE:  Value: 123 mg/dl  COMPARISON:  PET-CT 02/18/2014.  FINDINGS: NECK  No hypermetabolic lymph nodes in the neck.  CHEST  Compared to the prior examination, the left lower lobe mass has decreased in size,  currently measuring only 2.1 x 3.2 cm, and is now cavitary in appearance, compatible with intralesional necrosis. There is only low-level metabolic activity associated with this lesion at this time (SUVmax = 1.7). Multiple other smaller pulmonary nodules are again noted throughout the lungs bilaterally, increased in size and number compared to the prior examination, compatible with progressive metastatic disease to the lungs. The largest of these currently measures 8 mm in the anterior aspect of the right lower lobe (image 50 of series 6) and demonstrates some low-level metabolic activity (SUVmax = 1.7), which for lesion this size is highly suspicious for malignancy. No hypermetabolic mediastinal or hilar nodes. Mild centrilobular and paraseptal emphysema.  ABDOMEN/PELVIS  Previously noted soft tissue mass is in the vulvar region and lower vagina are no longer identified on today's examination. The continues to be some low-level hypermetabolic activity at the level of the introitus, particularly on the right side (SUVmax = 4.0), presumably indicative of residual disease. The extensive bilateral inguinal and external iliac lymphadenopathy noted on the prior examination has also significantly regressed, although there are numerous borderline enlarged and mildly enlarged bilateral inguinal and external iliac lymph nodes which demonstrate low-level metabolic activity on today's examination (SUVmax = 1.9-3.6), largest of which is in the right inguinal region measuring only 11 mm. No other new hypermetabolic lymphadenopathy noted elsewhere in the abdomen or pelvis.  No abnormal hypermetabolic activity within the liver, pancreas, adrenal glands, or spleen. 4 mm nonobstructive calculus in the lower pole collecting system of the right kidney. Extensive atherosclerosis throughout the abdominal and pelvic vasculature, without definite aneurysm. No significant volume of ascites. No pneumoperitoneum. No pathologic dilatation of  small bowel.  SKELETON  No focal hypermetabolic activity to suggest skeletal metastasis.  IMPRESSION: 1. Today's study demonstrates a positive response to radiation therapy both in the pelvis and with regard to the large left lower lobe mass, where there has been regression of disease in both locations. However, there is an increased number and size of numerous pulmonary nodules in the lungs bilaterally, compatible with progressive metastatic disease to the lungs, which could have originated from either primary lesion. 2. Additional incidental findings, as above, similar to prior examinations.   Electronically Signed   By: Vinnie Langton M.D.   On: 05/27/2014 10:43    Impression/Plan:  Excellent locoregional response to therapy.  Biopsy results pending.  Unfortunately, lung mets are clearly progressive.  See med/onc as scheduled for possible chemotherapy.  I will see her back in 48mo, sooner if needed. Rec'd oatmeal soaks for skin itching, rash. Offered vaginal dilator to prevent stenosis but she declines this today.  _____________________________________   [name], MD

## 2014-06-03 ENCOUNTER — Ambulatory Visit
Admission: RE | Admit: 2014-06-03 | Discharge: 2014-06-03 | Disposition: A | Discharge status: Home / Self Care | Payer: 59 | Source: Ambulatory Visit | Attending: Radiation Oncology | Admitting: Radiation Oncology

## 2014-06-03 ENCOUNTER — Other Ambulatory Visit: Payer: Self-pay

## 2014-06-03 ENCOUNTER — Encounter: Payer: Self-pay | Admitting: Radiation Oncology

## 2014-06-03 VITALS — BP 128/69 | HR 75 | Temp 97.8°F | Resp 20 | Wt 127.9 lb

## 2014-06-03 DIAGNOSIS — C519 Malignant neoplasm of vulva, unspecified: Secondary | ICD-10-CM

## 2014-06-03 NOTE — Progress Notes (Signed)
Patient denies pain, fatigue, loss of appetite, vaginal discharge, nausea, bladder/bowel issues. She is currently on steroid dose-pack for rash she acquired after her clothing was washed in wrong detergent. She states the skin in her pelvic treatment area "is much better". Dr Everitt Amber performed vulvar biopsy on her on 05/19/14, spoke with Adonis Brook in pathology who states Dr Saralyn Pilar is waiting on "recut".

## 2014-06-04 ENCOUNTER — Other Ambulatory Visit (HOSPITAL_BASED_OUTPATIENT_CLINIC_OR_DEPARTMENT_OTHER): Payer: 59

## 2014-06-04 ENCOUNTER — Ambulatory Visit (HOSPITAL_BASED_OUTPATIENT_CLINIC_OR_DEPARTMENT_OTHER): Payer: 59 | Admitting: Oncology

## 2014-06-04 ENCOUNTER — Other Ambulatory Visit: Payer: Self-pay | Admitting: Oncology

## 2014-06-04 ENCOUNTER — Telehealth: Payer: Self-pay | Admitting: Oncology

## 2014-06-04 ENCOUNTER — Encounter: Payer: Self-pay | Admitting: Oncology

## 2014-06-04 VITALS — BP 115/52 | HR 79 | Temp 98.5°F | Resp 18 | Ht 62.0 in | Wt 125.5 lb

## 2014-06-04 DIAGNOSIS — C778 Secondary and unspecified malignant neoplasm of lymph nodes of multiple regions: Secondary | ICD-10-CM

## 2014-06-04 DIAGNOSIS — C792 Secondary malignant neoplasm of skin: Secondary | ICD-10-CM

## 2014-06-04 DIAGNOSIS — T451X5A Adverse effect of antineoplastic and immunosuppressive drugs, initial encounter: Secondary | ICD-10-CM

## 2014-06-04 DIAGNOSIS — C7982 Secondary malignant neoplasm of genital organs: Secondary | ICD-10-CM

## 2014-06-04 DIAGNOSIS — L259 Unspecified contact dermatitis, unspecified cause: Secondary | ICD-10-CM

## 2014-06-04 DIAGNOSIS — D509 Iron deficiency anemia, unspecified: Secondary | ICD-10-CM

## 2014-06-04 DIAGNOSIS — C7802 Secondary malignant neoplasm of left lung: Secondary | ICD-10-CM

## 2014-06-04 DIAGNOSIS — C3492 Malignant neoplasm of unspecified part of left bronchus or lung: Secondary | ICD-10-CM

## 2014-06-04 DIAGNOSIS — D6481 Anemia due to antineoplastic chemotherapy: Secondary | ICD-10-CM

## 2014-06-04 DIAGNOSIS — C3432 Malignant neoplasm of lower lobe, left bronchus or lung: Secondary | ICD-10-CM

## 2014-06-04 DIAGNOSIS — C519 Malignant neoplasm of vulva, unspecified: Secondary | ICD-10-CM

## 2014-06-04 DIAGNOSIS — C7801 Secondary malignant neoplasm of right lung: Secondary | ICD-10-CM

## 2014-06-04 DIAGNOSIS — R21 Rash and other nonspecific skin eruption: Secondary | ICD-10-CM

## 2014-06-04 LAB — COMPREHENSIVE METABOLIC PANEL (CC13)
ALT: 27 U/L (ref 0–55)
AST: 32 U/L (ref 5–34)
Albumin: 3.1 g/dL — ABNORMAL LOW (ref 3.5–5.0)
Alkaline Phosphatase: 88 U/L (ref 40–150)
Anion Gap: 8 mEq/L (ref 3–11)
BUN: 14 mg/dL (ref 7.0–26.0)
CHLORIDE: 103 meq/L (ref 98–109)
CO2: 30 mEq/L — ABNORMAL HIGH (ref 22–29)
Calcium: 8.8 mg/dL (ref 8.4–10.4)
Creatinine: 0.8 mg/dL (ref 0.6–1.1)
EGFR: 76 mL/min/{1.73_m2} — ABNORMAL LOW (ref >90)
Glucose: 95 mg/dl (ref 70–140)
POTASSIUM: 4.4 meq/L (ref 3.5–5.1)
Sodium: 142 mEq/L (ref 136–145)
Total Bilirubin: <0.2 mg/dL (ref 0.20–1.20)
Total Protein: 6.6 g/dL (ref 6.4–8.3)

## 2014-06-04 LAB — CBC WITH DIFFERENTIAL/PLATELET
BASO%: 0.3 % (ref 0.0–2.0)
Basophils Absolute: 0 10*3/uL (ref 0.0–0.1)
EOS%: 2.2 % (ref 0.0–7.0)
Eosinophils Absolute: 0.3 10*3/uL (ref 0.0–0.5)
HCT: 29.7 % — ABNORMAL LOW (ref 34.8–46.6)
HGB: 9.1 g/dL — ABNORMAL LOW (ref 11.6–15.9)
LYMPH#: 1.7 10*3/uL (ref 0.9–3.3)
LYMPH%: 13.6 % — ABNORMAL LOW (ref 14.0–49.7)
MCH: 30.4 pg (ref 25.1–34.0)
MCHC: 30.6 g/dL — AB (ref 31.5–36.0)
MCV: 99.3 fL (ref 79.5–101.0)
MONO#: 1 10*3/uL — ABNORMAL HIGH (ref 0.1–0.9)
MONO%: 7.7 % (ref 0.0–14.0)
NEUT%: 76.2 % (ref 38.4–76.8)
NEUTROS ABS: 9.5 10*3/uL — AB (ref 1.5–6.5)
Platelets: 318 10*3/uL (ref 145–400)
RBC: 3 10*6/uL — AB (ref 3.70–5.45)
RDW: 20.1 % — AB (ref 11.2–14.5)
WBC: 12.4 10*3/uL — ABNORMAL HIGH (ref 3.9–10.3)

## 2014-06-04 LAB — TECHNOLOGIST REVIEW

## 2014-06-04 MED ORDER — METHYLPREDNISOLONE 4 MG PO TABS: 4.0000 mg | ORAL_TABLET | Freq: Three times a day (TID) | ORAL | Status: DC

## 2014-06-04 NOTE — Telephone Encounter (Signed)
, °

## 2014-06-04 NOTE — Progress Notes (Signed)
OFFICE PROGRESS NOTE      Physicians:Emma Rossi/ W.Skeet Latch ,[name], Jannet Mantis  INTERVAL HISTORY:  Patient is seen, alone for visit, in continuing attention to metastatic vulvar carcinoma and possible concomitant primary squamous cell lung carcinoma. She completed radiation to vulvar area including pelvic and inguinal nodes 04-23-14, given with sensitizing CDDP chemotherapy: she also had radiation to LLL lung mass. She had repeat CT chest 05-04-14, PET 05-27-14 and saw Dr Denman George on 05-29-14. The PET shows partial response in vulvar and pelvic areas and in the LLL mass, but increase in number and size of bilateral pulmonary nodules. She is to see both Dr Denman George and Dr Isidore Moos again in April.  When I saw patient last on 05-05-14, she had some minimal rash on left hand. She tells me now that the rash progressed to involve all of extremities and trunk, with severe puritis. She has history of contact skin allergies to scented detergents and soaps/ lotion, and realized after rash was severe that laundry detergent had changed (husband shopped and did laundry while she was receiving treatments). Rash was still severe when she saw Dr Denman George on 05-29-14, given Medrol dose pack then which she completed this AM. The rash is improving on feet and lower legs, still marked on arms and trunk; she is already itching more several hours out from last medrol dose this AM. She has had desquamation of dry skin as rash improves, no fever, no mucositis. Due to severity of rash even after completion of steroid dose pack, I have called 4 dermatology offices in [place], with information that she has seen Dr Allyn Kenner ~ 3 years ago. Dr Nevada Crane has opening in his [place] office for 06-08-14, which we appreciate.  If not for the rash, patient would be feeling much better from standpoint of vulvar disease and local treatment complications. Appetite is better, including improved taste. She denies increased  SOB or cough, no chest pain or hemoptysis. Stools are more formed and less frequent, voiding ok, much less perineal pain, no bleeding.  No PAC Flu vaccine done  ONCOLOGIC HISTORY  Patient presented with gradually enlarging mass at right vulva for ~ 8 months, uncomfortable with direct pressure and ulcerated with occasional bleeding. She was seen in ED on 02-02-14 with right inguinal adenopathy and right vulvar masses, then seen by Dr Elonda Husky on 02-03-14, clinically with vulvar carcinoma. She was then seen in consultation by Dr Skeet Latch on 02-05-14, with biopsy of both vulvar mass and satellite lesions on inner thigh and mons. Her exam found 7 cm right vulvar mass with 4 cm extension to distal vagina, extending anteriorly to urethral meatus and in ischiorectal area, with satellite lesions inner thigh and right mons. Pathology [id number]) showed invasive squamous cell carcinoma arising in background of high grade squamous intraepithelial lesion from vulva biopsy and invasive moderately differentiated squamous cell carcinoma from inner thigh lesion. CXR 02-06-14 showed left lower lobe lung mass 3.6 x 3.6 x 3.6 cm, with no adenopathy and no bony lesions, emphysematous changes. CT chest 02-09-14 showed the lower lobe mass 3.7 x 3.5 x 3.3 cm as well as 3 mm RLL nodule and faint 4 mm LUL nodule, no mediastinal or hilar nodes, no axillary or supraclavicular nodes, and upper abdomen not remarkable. PET 02-18-14 measured LLL lung mass 4.6 x 3.9 cm, hypermetabolic, and noted multiple subcentimeter nodules scattered thruout lungs bilaterally; the right vulvar mass was also hypermetabolic, extending up into right side of lower vagina, extensive R>L  and right external iliac adenopathy, no uptake in skeleton. 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 After initial consideration of bilateral inguinal lymphadenectomy, plan subsequently changed to radiation with  sensitizing CDDP. She had 3 stereotactic body radiation treatments to the LLL pulmonary mass 54 Gy in 3 fractions 10-16, 10-19 and 03-04-14. She received IMRT to vulvar area from  03-09-14, completed 04-23-14, with weekly CDDP as radiation sensitizer, 7 cycles of CDDP 03-09-14 thru 04-20-14.   Radiation to vulva, vaginal, and pelvic/inguinal nodes  was 60 Gy in 30 fractions to gross disease, 54 Gy in 30 fractions to high risk areas, 50.4Gy in 30 fractions to intermediate risk areas. She had vulvar, vaginal, and nodal boost of 6 Gy in 3 fractions to gross disease. Follow up CT chest 05-04-14 had cavitary, spiculated LLL mass 3.3 x 2.3 cm, having been 4.6 x 3.9 cm prior to IMRT, and no change in scattered small bilateral pulmonary nodules.         Review of systems as above, also: No symptoms of oral or esophageal thrush on steroids. No GERD with steroids, is taking with food. No further neurologic symptoms.Remainder of 10 point Review of Systems negative.  Objective:  Vital signs in last 24 hours:  BP 115/52 mmHg  Pulse 79  Temp(Src) 98.5 F (36.9 C) (Oral)  Resp 18  Ht 5' 2"  (1.575 m)  Wt 125 lb 8 oz (56.926 kg)  BMI 22.95 kg/m2  SpO2 100% weight up 2 lbs  Alert, oriented and appropriate. Ambulatory without difficulty, able to sit more easily. NAD but extensive rash which she is scratching on arms. More talkative and very cheerful with steroids.  No allopecia  HEENT:PERRL, sclerae not icteric. Oral mucosa moist without lesions, posterior pharynx clear.  Neck supple. No JVD.  Lymphatics:no cervical,supraclavicular adenopathy Resp: somewhat diminished BS thruout otherwise clear to auscultation bilaterally and normal percussion bilaterally. Respirations not labored on RA with activity in exam room. Cardio: regular rate and rhythm. No gallop. GI: soft, nontender, not distended, no mass or organomegaly. Normally active bowel sounds.  Musculoskeletal/ Extremities: without pitting edema,  cords, tenderness Neuro: no peripheral neuropathy. Otherwise nonfocal. PSYCH as above Skin palpable, confluent, brightly erythematous rash involving all of UE and most of trunk, sparing neck and upper central chest. Rash much less pronounced on legs with discoloration especially behind knees and some dry desquamation Perineum almost re-epithelialized, with 1x 1.5 cm ulceration still right vulva posteriorly.   Lab Results:  Results for orders placed or performed in visit on 06/04/14  TECHNOLOGIST REVIEW  Result Value Ref Range   Technologist Review Metas and Myelocytes present   CBC with Differential  Result Value Ref Range   WBC 12.4 (H) 3.9 - 10.3 10e3/uL   NEUT# 9.5 (H) 1.5 - 6.5 10e3/uL   HGB 9.1 (L) 11.6 - 15.9 g/dL   HCT 29.7 (L) 34.8 - 46.6 %   Platelets 318 145 - 400 10e3/uL   MCV 99.3 79.5 - 101.0 fL   MCH 30.4 25.1 - 34.0 pg   MCHC 30.6 (L) 31.5 - 36.0 g/dL   RBC 3.00 (L) 3.70 - 5.45 10e6/uL   RDW 20.1 (H) 11.2 - 14.5 %   lymph# 1.7 0.9 - 3.3 10e3/uL   MONO# 1.0 (H) 0.1 - 0.9 10e3/uL   Eosinophils Absolute 0.3 0.0 - 0.5 10e3/uL   Basophils Absolute 0.0 0.0 - 0.1 10e3/uL   NEUT% 76.2 38.4 - 76.8 %  LYMPH% 13.6 (L) 14.0 - 49.7 %   MONO% 7.7 0.0 - 14.0 %   EOS% 2.2 0.0 - 7.0 %   BASO% 0.3 0.0 - 2.0 %  Comprehensive metabolic panel (Cmet) - [facility]  Result Value Ref Range   Sodium 142 136 - 145 mEq/L   Potassium 4.4 3.5 - 5.1 mEq/L   Chloride 103 98 - 109 mEq/L   CO2 30 (H) 22 - 29 mEq/L   Glucose 95 70 - 140 mg/dl   BUN 14.0 7.0 - 26.0 mg/dL   Creatinine 0.8 0.6 - 1.1 mg/dL   Total Bilirubin <0.20 0.20 - 1.20 mg/dL   Alkaline Phosphatase 88 40 - 150 U/L   AST 32 5 - 34 U/L   ALT 27 0 - 55 U/L   Total Protein 6.6 6.4 - 8.3 g/dL   Albumin 3.1 (L) 3.5 - 5.0 g/dL   Calcium 8.8 8.4 - 10.4 mg/dL   Anion Gap 8 3 - 11 mEq/L   EGFR 76 (L) >90 ml/min/1.73 m2     Studies/Results: EXAM: NUCLEAR MEDICINE PET SKULL BASE TO THIGH   05-27-14  COMPARISON: PET-CT  02/18/2014.  FINDINGS: NECK  No hypermetabolic lymph nodes in the neck.  CHEST  Compared to the prior examination, the left lower lobe mass has decreased in size, currently measuring only 2.1 x 3.2 cm, and is now cavitary in appearance, compatible with intralesional necrosis. There is only low-level metabolic activity associated with this lesion at this time (SUVmax = 1.7). Multiple other smaller pulmonary nodules are again noted throughout the lungs bilaterally, increased in size and number compared to the prior examination, compatible with progressive metastatic disease to the lungs. The largest of these currently measures 8 mm in the anterior aspect of the right lower lobe (image 50 of series 6) and demonstrates some low-level metabolic activity (SUVmax = 1.7), which for lesion this size is highly suspicious for malignancy. No hypermetabolic mediastinal or hilar nodes. Mild centrilobular and paraseptal emphysema.  ABDOMEN/PELVIS  Previously noted soft tissue mass is in the vulvar region and lower vagina are no longer identified on today's examination. The continues to be some low-level hypermetabolic activity at the level of the introitus, particularly on the right side (SUVmax = 4.0), presumably indicative of residual disease. The extensive bilateral inguinal and external iliac lymphadenopathy noted on the prior examination has also significantly regressed, although there are numerous borderline enlarged and mildly enlarged bilateral inguinal and external iliac lymph nodes which demonstrate low-level metabolic activity on today's examination (SUVmax = 1.9-3.6), largest of which is in the right inguinal region measuring only 11 mm. No other new hypermetabolic lymphadenopathy noted elsewhere in the abdomen or pelvis.  No abnormal hypermetabolic activity within the liver, pancreas, adrenal glands, or spleen. 4 mm nonobstructive calculus in the lower pole collecting  system of the right kidney. Extensive atherosclerosis throughout the abdominal and pelvic vasculature, without definite aneurysm. No significant volume of ascites. No pneumoperitoneum. No pathologic dilatation of small bowel.  SKELETON  No focal hypermetabolic activity to suggest skeletal metastasis.  IMPRESSION: 1. Today's study demonstrates a positive response to radiation therapy both in the pelvis and with regard to the large left lower lobe mass, where there has been regression of disease in both locations. However, there is an increased number and size of numerous pulmonary nodules in the lungs bilaterally, compatible with progressive metastatic disease to the lungs, which could have originated from either primary lesion. 2. Additional incidental findings, as above, similar to prior  examinations.   MD reviewed PET images in PACs. Information discussed with patient now.   Medications: I have reviewed the patient's current medications. As Dr Nevada Crane will be able to assist on 1-25, I have given prescription for methylprednisolone 4 mg tid with food until then. She will continue hypoallergenic lotion and strictly avoid problem detergent.  DISCUSSION: I have clearly explained to patient that treatment is in attempt to control this metastatic disease, but that there is not any treatment that will be curative. We have discussed fact that she may have separate squamous cell lung cancer in addition to squamous cell vulvar cancer, but that same chemotherapy agents can be useful in both. Dr Denman George has suggested adding taxol to CDDP, which seems very appropriate and likely will be adequately tolerated with weekly doses. This severe rash needs to be resolved prior to resuming chemotherapy.   I will see her back in ~ 2 weeks, and will begin chemotherapy from there if rash resolved.   Chemo teaching for weekly taxol done by RN today. Verbal consent given. Will request  preauthorization.  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 Improved local disease with RT/ sensitizing CDDP. Will continue treatment with weekly CDDP/ taxol, likely to begin in 2-3 weeks.  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 Bilateral subcentimeter pulmonary nodules increased by recent PET as compared with CT chest in Dec. Will continue treatment with CDDP taxol as we try to improve/ control metastatic disease. 3.severe puritic rash which began after CT with contrast 05-04-14 but may have been related to detergent, slightly better with medrol dose pack completed this AM, but still severe. Continue medrol 4 mg tid until sees dermatologist 06-08-14. Would need ED evaluation if fever or worsening symptoms. 4.transient speech difficulty with numbness of lip andleft hand early Dec. Neurology evaluation in process, no metastatic disease found. On ASA 5. BP somewhat low, which seems to be baseline, not on antihypertensives. Continue to push po fluids and follow, 6.history migraine HAs. No HA with zofran.  7.allergy PCN  8.BTL  9. Advance Directives done 10.flu vaccine given  11.anemia related to chemo and pelvic RT.improving, continue oral iron    Fax this note to Dr Nevada Crane in [place] office [phone] for appointment 06-08-14 at 0920. Time spent 40 min including >50% counseling and coordination of care.   Akshay Spang P, MD   06/04/2014, 3:06 PM

## 2014-06-05 ENCOUNTER — Telehealth: Payer: Self-pay | Admitting: *Deleted

## 2014-06-05 NOTE — Telephone Encounter (Signed)
Per Dr. Denman George, patient called with results of biopsy - let her know that that it showed no residual cancer at the vulva and that no surgery is indicated at this time. Patient very appreciative of call. Told her to call us with any further questions or concerns - pt agreeable to this.

## 2014-06-07 ENCOUNTER — Other Ambulatory Visit: Payer: Self-pay | Admitting: Oncology

## 2014-06-08 ENCOUNTER — Ambulatory Visit (INDEPENDENT_AMBULATORY_CARE_PROVIDER_SITE_OTHER): Payer: 59 | Admitting: Family Medicine

## 2014-06-08 ENCOUNTER — Telehealth: Payer: Self-pay

## 2014-06-08 ENCOUNTER — Telehealth: Payer: Self-pay | Admitting: Oncology

## 2014-06-08 ENCOUNTER — Telehealth: Payer: Self-pay | Admitting: *Deleted

## 2014-06-08 ENCOUNTER — Encounter: Payer: Self-pay | Admitting: Family Medicine

## 2014-06-08 VITALS — BP 138/88 | Temp 98.1°F | Ht 61.0 in | Wt 123.0 lb

## 2014-06-08 DIAGNOSIS — L259 Unspecified contact dermatitis, unspecified cause: Secondary | ICD-10-CM

## 2014-06-08 DIAGNOSIS — C519 Malignant neoplasm of vulva, unspecified: Secondary | ICD-10-CM

## 2014-06-08 DIAGNOSIS — R21 Rash and other nonspecific skin eruption: Secondary | ICD-10-CM

## 2014-06-08 DIAGNOSIS — C3432 Malignant neoplasm of lower lobe, left bronchus or lung: Secondary | ICD-10-CM

## 2014-06-08 NOTE — Telephone Encounter (Signed)
, °

## 2014-06-08 NOTE — Telephone Encounter (Signed)
Advised scheduler to move lab

## 2014-06-08 NOTE — Telephone Encounter (Signed)
Faxed 06-04-14 office note to [facility] office as requested by Dr. Marko Plume.

## 2014-06-08 NOTE — Telephone Encounter (Signed)
Per staff message and POF I have scheduled appts. Advised scheduler of appts. JMW  

## 2014-06-08 NOTE — Progress Notes (Signed)
   Subjective:    Patient ID: Jody Beard, female    DOB: [birth year], 64 y.o.   MRN: [id number] Patient arrives as a new patient. Fairly complicated. Recent diagnoses of both lung cancer and vulvar carcinoma. In the midst of intervention and therapy for this.   States that she has not had a primary care condition in the past.  History of eczema type eruptions in the past long before these cancers came along. And long before any type of chemotherapy.  Was exposed to new D Turgeon's through home laundry. This appeared to trigger a very significant rash. Oncologist strongly recommended dermatology referral. 2 dermatologist would not see her without a formal referral from a family doctor, which she did not have. Rash This is a new problem. Episode onset: 2 weeks ago. The problem has been gradually worsening since onset. The rash is diffuse. The rash is characterized by dryness, itchiness, redness, blistering and draining. She was exposed to a new detergent/soap. Past treatments include antihistamine, oral steroids and moisturizer. The treatment provided mild relief. Her past medical history is significant for allergies and eczema.    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  no headache no chest pain occasional cough no major shortness breath     Objective:   Physical Exam Alert mild malaise pruritus evident. Lungs clear. Heart regular in rhythm vital stable skin diffuse impressive hyperemic hyper trophic scaly eruption arms trunk legs       Assessment & Plan:  Pressure 1 lung cancer #2 fall for carcinoma #3 severe contact dermatitis plan urgent dermatology referral. 25 minutes spent most in discussion. WSL

## 2014-06-08 NOTE — Telephone Encounter (Signed)
-----   Message from Gordy Levan, MD sent at 06/07/2014 10:49 AM EST ----- Please be sure my note 1-21 has been faxed to Dr Allyn Kenner at his [facility] dermatology office, for appointment there 1-25 @ 0920. Fax # / contact # for that office at bottom of my 1-21 note. (This may have already happened on Fri 1-22, but just in case did not. I can only route to his [place] office from EMR) Thanks

## 2014-06-09 ENCOUNTER — Other Ambulatory Visit: Payer: Self-pay | Admitting: Oncology

## 2014-06-09 NOTE — Progress Notes (Signed)
MEDICAL ONCOLOGY  At request of managed care, to allow preauthorization, order for neupogen/ granix placed for 06-25-14. Not signed.  Godfrey Pick, MD

## 2014-06-17 ENCOUNTER — Other Ambulatory Visit: Payer: Self-pay | Admitting: Oncology

## 2014-06-17 ENCOUNTER — Telehealth: Payer: Self-pay

## 2014-06-17 DIAGNOSIS — C519 Malignant neoplasm of vulva, unspecified: Secondary | ICD-10-CM

## 2014-06-17 DIAGNOSIS — C3432 Malignant neoplasm of lower lobe, left bronchus or lung: Secondary | ICD-10-CM

## 2014-06-17 NOTE — Telephone Encounter (Signed)
-----   Message from Gordy Levan, MD sent at 06/17/2014  7:44 AM EST ----- Please try to get dermatology note for my visit 2-4 if possible - may have been Dr Allyn Kenner in [place], or may have been that new PCP Dr W.Luking with [facility] sent her somewhere else. I had set up Dr Nevada Crane for 06-08-14, but looks like that office then would not see her without PCP referral.  thanks

## 2014-06-17 NOTE — Telephone Encounter (Signed)
Received office note from visit on 06-08-14 with [employer].  Note placed on Dr. Mariana Kaufman desk for review.

## 2014-06-18 ENCOUNTER — Telehealth: Payer: Self-pay | Admitting: Oncology

## 2014-06-18 ENCOUNTER — Ambulatory Visit (HOSPITAL_BASED_OUTPATIENT_CLINIC_OR_DEPARTMENT_OTHER): Payer: 59 | Admitting: Oncology

## 2014-06-18 ENCOUNTER — Other Ambulatory Visit (HOSPITAL_BASED_OUTPATIENT_CLINIC_OR_DEPARTMENT_OTHER): Payer: 59

## 2014-06-18 ENCOUNTER — Encounter: Payer: Self-pay | Admitting: Oncology

## 2014-06-18 VITALS — BP 136/77 | HR 96 | Temp 98.3°F | Resp 18 | Ht 61.0 in | Wt 127.1 lb

## 2014-06-18 DIAGNOSIS — G458 Other transient cerebral ischemic attacks and related syndromes: Secondary | ICD-10-CM

## 2014-06-18 DIAGNOSIS — C778 Secondary and unspecified malignant neoplasm of lymph nodes of multiple regions: Secondary | ICD-10-CM

## 2014-06-18 DIAGNOSIS — C519 Malignant neoplasm of vulva, unspecified: Secondary | ICD-10-CM

## 2014-06-18 DIAGNOSIS — F17201 Nicotine dependence, unspecified, in remission: Secondary | ICD-10-CM

## 2014-06-18 DIAGNOSIS — I878 Other specified disorders of veins: Secondary | ICD-10-CM

## 2014-06-18 DIAGNOSIS — C3432 Malignant neoplasm of lower lobe, left bronchus or lung: Secondary | ICD-10-CM

## 2014-06-18 DIAGNOSIS — L259 Unspecified contact dermatitis, unspecified cause: Secondary | ICD-10-CM

## 2014-06-18 DIAGNOSIS — C792 Secondary malignant neoplasm of skin: Secondary | ICD-10-CM

## 2014-06-18 DIAGNOSIS — D6481 Anemia due to antineoplastic chemotherapy: Secondary | ICD-10-CM

## 2014-06-18 DIAGNOSIS — T451X5A Adverse effect of antineoplastic and immunosuppressive drugs, initial encounter: Secondary | ICD-10-CM

## 2014-06-18 LAB — COMPREHENSIVE METABOLIC PANEL (CC13)
ALBUMIN: 3.5 g/dL (ref 3.5–5.0)
ALT: 23 U/L (ref 0–55)
AST: 18 U/L (ref 5–34)
Alkaline Phosphatase: 96 U/L (ref 40–150)
Anion Gap: 12 mEq/L — ABNORMAL HIGH (ref 3–11)
BILIRUBIN TOTAL: 0.24 mg/dL (ref 0.20–1.20)
BUN: 16.3 mg/dL (ref 7.0–26.0)
CO2: 25 mEq/L (ref 22–29)
CREATININE: 0.8 mg/dL (ref 0.6–1.1)
Calcium: 9.2 mg/dL (ref 8.4–10.4)
Chloride: 108 mEq/L (ref 98–109)
EGFR: 76 mL/min/{1.73_m2} — AB (ref >90)
Glucose: 111 mg/dl (ref 70–140)
POTASSIUM: 4.1 meq/L (ref 3.5–5.1)
SODIUM: 145 meq/L (ref 136–145)
TOTAL PROTEIN: 7.2 g/dL (ref 6.4–8.3)

## 2014-06-18 LAB — CBC WITH DIFFERENTIAL/PLATELET
BASO%: 0.1 % (ref 0.0–2.0)
BASOS ABS: 0 10*3/uL (ref 0.0–0.1)
EOS%: 4.1 % (ref 0.0–7.0)
Eosinophils Absolute: 0.4 10*3/uL (ref 0.0–0.5)
HEMATOCRIT: 36.5 % (ref 34.8–46.6)
HEMOGLOBIN: 11.5 g/dL — AB (ref 11.6–15.9)
LYMPH#: 2.2 10*3/uL (ref 0.9–3.3)
LYMPH%: 25.5 % (ref 14.0–49.7)
MCH: 32.9 pg (ref 25.1–34.0)
MCHC: 31.5 g/dL (ref 31.5–36.0)
MCV: 104.3 fL — AB (ref 79.5–101.0)
MONO#: 0.8 10*3/uL (ref 0.1–0.9)
MONO%: 9.1 % (ref 0.0–14.0)
NEUT#: 5.2 10*3/uL (ref 1.5–6.5)
NEUT%: 61.2 % (ref 38.4–76.8)
Platelets: 212 10*3/uL (ref 145–400)
RBC: 3.5 10*6/uL — ABNORMAL LOW (ref 3.70–5.45)
RDW: 17.8 % — ABNORMAL HIGH (ref 11.2–14.5)
WBC: 8.5 10*3/uL (ref 3.9–10.3)

## 2014-06-18 LAB — MAGNESIUM (CC13): Magnesium: 2.4 mg/dl (ref 1.5–2.5)

## 2014-06-18 MED ORDER — ONDANSETRON HCL 8 MG PO TABS: 8.0000 mg | ORAL_TABLET | Freq: Three times a day (TID) | ORAL | Status: DC | PRN

## 2014-06-18 MED ORDER — DEXAMETHASONE 4 MG PO TABS: ORAL_TABLET | ORAL | Status: DC

## 2014-06-18 NOTE — Telephone Encounter (Signed)
per pof to sch pt appt-sent MW email to sch trmts-pt to get updated sch 2/8

## 2014-06-18 NOTE — Progress Notes (Signed)
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 She has been treated to this point with  SBRT to LLL pulmonary mass, and radiation to vulva/ pelvic and inguinal nodes with sensitizing CDDP thru 04-23-14, with partial response in those areas and increase in bilateral pulmonary mets.  Additional chemotherapy has been delayed due to recent severe flare of eczema. The eczema is responding to treatment by dermatology such that she will have first taxol with CDDP on 06-22-14.   After initial difficulty seeing dermatology on 06-08-14 (needed PCP referral, was worked in by Dr Wolfgang Phoenix then referred back to dermatology same day), patient was given Kenalog injection, and begun on Halog cream bid, prn hydroxyzine and additional prednisone which is now completed; she was not given scheduled return visit to dermatology. The dermatitis has improved considerably, tho not entirely resolved.  Otherwise perineum is so much better that she was able to ride 4 wheeler over weekend. Bowels are mostly formed and appetite is good. She denies increased SOB, cough, chest pain.   She is now in agreement with PAC, which we will get as soon as possible, but expect to do treatment on 06-22-14 again peripherally.  Flu vaccine done  ONCOLOGIC HISTORY Patient presented with gradually enlarging mass at right vulva for ~ 8 months, uncomfortable with direct pressure and ulcerated with occasional bleeding. She was seen in ED on 02-02-14 with right inguinal adenopathy and right vulvar masses, then seen by Dr Elonda Husky on 02-03-14, clinically with vulvar carcinoma. She was then seen in consultation by Dr Skeet Latch on 02-05-14, with biopsy of both vulvar mass and  satellite lesions on inner thigh and mons. Her exam found 7 cm right vulvar mass with 4 cm extension to distal vagina, extending anteriorly to urethral meatus and in ischiorectal area, with satellite lesions inner thigh and right mons. Pathology [phone]) showed invasive squamous cell carcinoma arising in background of high grade squamous intraepithelial lesion from vulva biopsy and invasive moderately differentiated squamous cell carcinoma from inner thigh lesion. CXR 02-06-14 showed left lower lobe lung mass 3.6 x 3.6 x 3.6 cm, with no adenopathy and no bony lesions, emphysematous changes. CT chest 02-09-14 showed the lower lobe mass 3.7 x 3.5 x 3.3 cm as well as 3 mm RLL nodule and faint 4 mm LUL nodule, no mediastinal or hilar nodes, no axillary or supraclavicular nodes, and upper abdomen not remarkable. PET 02-18-14 measured LLL lung mass 4.6 x 3.9 cm, hypermetabolic, and noted multiple subcentimeter nodules scattered thruout lungs bilaterally; the right vulvar mass was also hypermetabolic, extending up into right side of lower vagina, extensive R>L and right external iliac adenopathy, no uptake in skeleton. 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 After initial consideration of bilateral inguinal lymphadenectomy, plan subsequently changed to radiation with sensitizing CDDP. She had 3 stereotactic body radiation treatments to the LLL pulmonary mass 54 Gy in 3 fractions 10-16, 10-19 and 03-04-14. She received IMRT to vulvar area from 03-09-14, completed 04-23-14, with weekly CDDP as radiation sensitizer, 7 cycles of CDDP 03-09-14 thru 04-20-14. Radiation to vulva, vaginal, and pelvic/inguinal nodes was 60 Gy in 30 fractions to gross disease, 54 Gy in 30 fractions to high risk areas,  50.4Gy in 30 fractions to intermediate risk areas. She had vulvar, vaginal, and nodal boost of 6 Gy in 3 fractions to gross disease. Follow up CT chest  05-04-14 had cavitary, spiculated LLL mass 3.3 x 2.3 cm, having been 4.6 x 3.9 cm prior to IMRT, and no change in scattered small bilateral pulmonary nodules.   Review of systems as above, also: No fever or symptoms of infection. No bleeding. No LE swelling. No peripheral neuropathy. No difficulty hearing. Remainder of 10 point Review of Systems negative.  Objective:  Vital signs in last 24 hours:  BP 136/77 mmHg  Pulse 96  Temp(Src) 98.3 F (36.8 C) (Oral)  Resp 18  Ht 5' 1"  (1.549 m)  Wt 127 lb 1.6 oz (57.652 kg)  BMI 24.03 kg/m2 Weight up 4 lbs. Alert, oriented and appropriate. Ambulatory without assistance difficulty.  No alopecia  HEENT:PERRL, sclerae not icteric. Oral mucosa moist, no thrush,  posterior pharynx clear.  Neck supple. No JVD.  Lymphatics:no cervical,supraclavicular adenopathy Resp: diminished BS thruout otherwise clear to auscultation bilaterally and no dullness to percussion Cardio: regular rate and rhythm. No gallop. GI: soft, nontender, not distended, no mass or organomegaly. Normally active bowel sounds.  Musculoskeletal/ Extremities: without pitting edema, cords, tenderness Neuro: no peripheral neuropathy. Otherwise nonfocal. PSYCH mood and affect appropriate Skin without ecchymosis, petechiae. Rash on extremities much improved, still discoloration upper arms and scattered on forearms, still erythema bilateral trunk and a few small patches face and neck. No desquamation.    Lab Results:  Results for orders placed or performed in visit on 06/18/14  CBC with Differential  Result Value Ref Range   WBC 8.5 3.9 - 10.3 10e3/uL   NEUT# 5.2 1.5 - 6.5 10e3/uL   HGB 11.5 (L) 11.6 - 15.9 g/dL   HCT 36.5 34.8 - 46.6 %   Platelets 212 145 - 400 10e3/uL   MCV 104.3 (H) 79.5 - 101.0 fL   MCH 32.9 25.1 - 34.0 pg   MCHC 31.5 31.5 - 36.0 g/dL   RBC 3.50 (L) 3.70 - 5.45 10e6/uL   RDW 17.8 (H) 11.2 - 14.5 %   lymph# 2.2 0.9 - 3.3 10e3/uL   MONO# 0.8 0.1 - 0.9  10e3/uL   Eosinophils Absolute 0.4 0.0 - 0.5 10e3/uL   Basophils Absolute 0.0 0.0 - 0.1 10e3/uL   NEUT% 61.2 38.4 - 76.8 %   LYMPH% 25.5 14.0 - 49.7 %   MONO% 9.1 0.0 - 14.0 %   EOS% 4.1 0.0 - 7.0 %   BASO% 0.1 0.0 - 2.0 %  Comprehensive metabolic panel (Cmet) - [facility]  Result Value Ref Range   Sodium 145 136 - 145 mEq/L   Potassium 4.1 3.5 - 5.1 mEq/L   Chloride 108 98 - 109 mEq/L   CO2 25 22 - 29 mEq/L   Glucose 111 70 - 140 mg/dl   BUN 16.3 7.0 - 26.0 mg/dL   Creatinine 0.8 0.6 - 1.1 mg/dL   Total Bilirubin 0.24 0.20 - 1.20 mg/dL   Alkaline Phosphatase 96 40 - 150 U/L   AST 18 5 - 34 U/L   ALT 23 0 - 55 U/L   Total Protein 7.2 6.4 - 8.3 g/dL   Albumin 3.5 3.5 - 5.0 g/dL   Calcium 9.2 8.4 - 10.4 mg/dL   Anion Gap 12 (H) 3 - 11 mEq/L   EGFR 76 (L) >90 ml/min/1.73 m2  Magnesium  Result Value Ref Range   Magnesium 2.4 1.5 - 2.5 mg/dl  Studies/Results:  No results found.  Medications: I have reviewed the patient's current medications.  Refill zofran for upcoming chemo and decadron  DISCUSSION Severe eczema appears resolved adequately that she can begin chemo 06-22-14. As she did so well with weekly CDDP previously, will start with weekly CDDP and taxol, but may change to q 3 week if prefers this later. Reviewed oral prehydration for CDDP and decadron for taxol  She did not had diver mutations evaluated on initial LLL lung biopsy; path refers to multiple cores submitted. I will discuss with my partner, but with known advanced squamous cell vulvar carcinoma and good PS, chemotherapy most appropriate now. I did not discuss possible additional path evaluation with patient now  I let patient know that Dr Ancil Linsey is now working at [facility], as she lives in [place]. As she continues close follow up also with Drs Denman George and Isidore Moos in [place] (to see both of those physicians in April), she prefers to continue treatment here for now. Note husband may need  orhopedic surgery upcoming, in which case treatment in [place] may be much easier for her.  PAC as above.   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 Improved local disease with RT/ sensitizing CDDP. Will continue treatment with weekly CDDP/ taxol beginning 06-22-14. 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 Bilateral subcentimeter pulmonary nodules increased by recent PET as compared with CT chest in Dec. Will continue treatment with CDDP taxol as we try to improve/ control metastatic disease. I will discuss with my partner in thoracic oncology also. 3.severe eczema flare, apparently from different detergent. Much improved. We very much appreciate Dr Lance Sell assistance as PCP on 06-08-14, and dermatology evaluation. 4.transient speech difficulty with numbness of lip andleft hand early Dec. Neurology involved, no metastatic disease found. On ASA 5. BP improved with better hydration. Follow with upcoming chemo 6.history migraine HAs. No HA with zofran.  7.allergy PCN  8.BTL  9. Advance Directives done 10.flu vaccine given  11.anemia related to chemo and pelvic RT: much improved, continue oral iron. 12.poor venous access: PAC requested by IR  Patient is in agreement with plans above and knows that she can call at any time prior to scheduled appointment if needed. Consent obtained. Managed care notified. Chemo orders confirmed. Time spent 25 min including >50% counseling and coordination of care.    Kieon Lawhorn P, MD   06/18/2014, 4:01 PM

## 2014-06-19 ENCOUNTER — Other Ambulatory Visit: Payer: Self-pay | Admitting: *Deleted

## 2014-06-19 ENCOUNTER — Telehealth: Payer: Self-pay | Admitting: *Deleted

## 2014-06-19 ENCOUNTER — Encounter: Payer: Self-pay | Admitting: Oncology

## 2014-06-19 DIAGNOSIS — C519 Malignant neoplasm of vulva, unspecified: Secondary | ICD-10-CM

## 2014-06-19 NOTE — Telephone Encounter (Signed)
Per staff message and POF I have scheduled appts. Advised scheduler of appts. JMW  

## 2014-06-19 NOTE — Progress Notes (Signed)
Cisplatin and Paclitaxel approved from 06/19/14 to 06/16/15.  Auth #: [id number].  J codes: 1442, 1100, 1453, 08/20/67 and 1940 does not req auth.

## 2014-06-19 NOTE — Telephone Encounter (Signed)
Patient called to review decadron premeds and hydration before her chemotherapy scheduled on Monday. Patient states she has picked up the decadron tablets from her pharmacy. Patient wrote down to take 5 tablets at 9pm and 5 tablets again at 3am before chemo with food. She is also agreeable to drink at least five glasses (8 oz.) of water the night before and the morning of chemo. Reviewed  scheduled appts on Monday and patient will arrive at 8am to check-in.

## 2014-06-20 ENCOUNTER — Other Ambulatory Visit: Payer: Self-pay | Admitting: Oncology

## 2014-06-20 DIAGNOSIS — I878 Other specified disorders of veins: Secondary | ICD-10-CM | POA: Insufficient documentation

## 2014-06-22 ENCOUNTER — Other Ambulatory Visit (HOSPITAL_BASED_OUTPATIENT_CLINIC_OR_DEPARTMENT_OTHER): Payer: 59

## 2014-06-22 ENCOUNTER — Other Ambulatory Visit: Payer: 59

## 2014-06-22 ENCOUNTER — Ambulatory Visit (HOSPITAL_BASED_OUTPATIENT_CLINIC_OR_DEPARTMENT_OTHER): Payer: 59

## 2014-06-22 DIAGNOSIS — C3432 Malignant neoplasm of lower lobe, left bronchus or lung: Secondary | ICD-10-CM

## 2014-06-22 DIAGNOSIS — C778 Secondary and unspecified malignant neoplasm of lymph nodes of multiple regions: Secondary | ICD-10-CM | POA: Diagnosis not present

## 2014-06-22 DIAGNOSIS — C519 Malignant neoplasm of vulva, unspecified: Secondary | ICD-10-CM | POA: Diagnosis not present

## 2014-06-22 DIAGNOSIS — Z5189 Encounter for other specified aftercare: Secondary | ICD-10-CM

## 2014-06-22 DIAGNOSIS — C792 Secondary malignant neoplasm of skin: Secondary | ICD-10-CM

## 2014-06-22 LAB — CBC WITH DIFFERENTIAL/PLATELET
BASO%: 0.2 % (ref 0.0–2.0)
BASOS ABS: 0 10*3/uL (ref 0.0–0.1)
EOS%: 0.2 % (ref 0.0–7.0)
Eosinophils Absolute: 0 10*3/uL (ref 0.0–0.5)
HEMATOCRIT: 38.1 % (ref 34.8–46.6)
HGB: 12.1 g/dL (ref 11.6–15.9)
LYMPH#: 1 10*3/uL (ref 0.9–3.3)
LYMPH%: 14.8 % (ref 14.0–49.7)
MCH: 32.4 pg (ref 25.1–34.0)
MCHC: 31.8 g/dL (ref 31.5–36.0)
MCV: 101.9 fL — AB (ref 79.5–101.0)
MONO#: 0.1 10*3/uL (ref 0.1–0.9)
MONO%: 1.2 % (ref 0.0–14.0)
NEUT#: 5.5 10*3/uL (ref 1.5–6.5)
NEUT%: 83.6 % — AB (ref 38.4–76.8)
PLATELETS: 241 10*3/uL (ref 145–400)
RBC: 3.74 10*6/uL (ref 3.70–5.45)
RDW: 15.9 % — ABNORMAL HIGH (ref 11.2–14.5)
WBC: 6.5 10*3/uL (ref 3.9–10.3)
nRBC: 0 % (ref 0–0)

## 2014-06-22 MED ORDER — ACETAMINOPHEN 325 MG PO TABS
ORAL_TABLET | ORAL | Status: AC
Start: 2014-06-22 — End: 2014-06-22
  Filled 2014-06-22: qty 2

## 2014-06-22 MED ORDER — FUROSEMIDE 10 MG/ML IJ SOLN
20.0000 mg | Freq: Once | INTRAMUSCULAR | Status: AC
  Administered 2014-06-22: 20 mg via INTRAVENOUS

## 2014-06-22 MED ORDER — POTASSIUM CHLORIDE 2 MEQ/ML IV SOLN
Freq: Once | INTRAVENOUS | Status: AC
  Administered 2014-06-22: 09:00:00 via INTRAVENOUS
  Filled 2014-06-22: qty 10

## 2014-06-22 MED ORDER — PALONOSETRON HCL INJECTION 0.25 MG/5ML
0.2500 mg | Freq: Once | INTRAVENOUS | Status: AC
  Administered 2014-06-22: 0.25 mg via INTRAVENOUS

## 2014-06-22 MED ORDER — SODIUM CHLORIDE 0.9 % IV SOLN
150.0000 mg | Freq: Once | INTRAVENOUS | Status: AC
  Administered 2014-06-22: 150 mg via INTRAVENOUS
  Filled 2014-06-22: qty 5

## 2014-06-22 MED ORDER — FAMOTIDINE IN NACL 20-0.9 MG/50ML-% IV SOLN
INTRAVENOUS | Status: AC
  Filled 2014-06-22: qty 50

## 2014-06-22 MED ORDER — SODIUM CHLORIDE 0.9 % IV SOLN
40.0000 mg/m2 | Freq: Once | INTRAVENOUS | Status: AC
  Administered 2014-06-22: 63 mg via INTRAVENOUS
  Filled 2014-06-22: qty 63

## 2014-06-22 MED ORDER — DEXAMETHASONE SODIUM PHOSPHATE 20 MG/5ML IJ SOLN
12.0000 mg | Freq: Once | INTRAMUSCULAR | Status: AC
  Administered 2014-06-22: 12 mg via INTRAVENOUS

## 2014-06-22 MED ORDER — FAMOTIDINE IN NACL 20-0.9 MG/50ML-% IV SOLN
20.0000 mg | Freq: Once | INTRAVENOUS | Status: AC
  Administered 2014-06-22: 20 mg via INTRAVENOUS

## 2014-06-22 MED ORDER — PACLITAXEL CHEMO INJECTION 300 MG/50ML
80.0000 mg/m2 | Freq: Once | INTRAVENOUS | Status: AC
  Administered 2014-06-22: 126 mg via INTRAVENOUS
  Filled 2014-06-22: qty 21

## 2014-06-22 MED ORDER — ACETAMINOPHEN 325 MG PO TABS
650.0000 mg | ORAL_TABLET | Freq: Once | ORAL | Status: AC
  Administered 2014-06-22: 650 mg via ORAL

## 2014-06-22 MED ORDER — DEXAMETHASONE SODIUM PHOSPHATE 20 MG/5ML IJ SOLN
INTRAMUSCULAR | Status: AC
  Filled 2014-06-22: qty 5

## 2014-06-22 MED ORDER — PALONOSETRON HCL INJECTION 0.25 MG/5ML
INTRAVENOUS | Status: AC
  Filled 2014-06-22: qty 5

## 2014-06-22 MED ORDER — DIPHENHYDRAMINE HCL 50 MG/ML IJ SOLN
50.0000 mg | Freq: Once | INTRAMUSCULAR | Status: AC
  Administered 2014-06-22: 50 mg via INTRAVENOUS

## 2014-06-22 MED ORDER — DIPHENHYDRAMINE HCL 50 MG/ML IJ SOLN
INTRAMUSCULAR | Status: AC
  Filled 2014-06-22: qty 1

## 2014-06-22 MED ORDER — SODIUM CHLORIDE 0.9 % IV SOLN
Freq: Once | INTRAVENOUS | Status: AC
  Administered 2014-06-22: 09:00:00 via INTRAVENOUS

## 2014-06-22 NOTE — Patient Instructions (Signed)
[facility] Discharge Instructions for Patients Receiving Chemotherapy  Today you received the following chemotherapy agents Taxol and Cisplatin  To help prevent nausea and vomiting after your treatment, we encourage you to take your nausea medication Zofran 8 mg every 8 hours as needed.   If you develop nausea and vomiting that is not controlled by your nausea medication, call the clinic.   BELOW ARE SYMPTOMS THAT SHOULD BE REPORTED IMMEDIATELY:  *FEVER GREATER THAN 100.5 F  *CHILLS WITH OR WITHOUT FEVER  NAUSEA AND VOMITING THAT IS NOT CONTROLLED WITH YOUR NAUSEA MEDICATION  *UNUSUAL SHORTNESS OF BREATH  *UNUSUAL BRUISING OR BLEEDING  TENDERNESS IN MOUTH AND THROAT WITH OR WITHOUT PRESENCE OF ULCERS  *URINARY PROBLEMS  *BOWEL PROBLEMS  UNUSUAL RASH Items with * indicate a potential emergency and should be followed up as soon as possible.  Feel free to call the clinic you have any questions or concerns. The clinic phone number is [phone].

## 2014-06-23 ENCOUNTER — Telehealth: Payer: Self-pay | Admitting: *Deleted

## 2014-06-23 NOTE — Telephone Encounter (Signed)
Patient called requesting we give her a letter for [facility] for a six week leave of absence in order for her to complete Chemotherapy.  Instructed to bring forms to office for managed care and to allow 7 to ten business days for completion for pick fax or mailing form.  "They told me you all have the forms or paper work.  I'll call [facility] and call back." Will notify provider of this request.  Next F/U is 06-29-2014 with CDDP/Taxotere.Marland Kitchen

## 2014-06-24 ENCOUNTER — Telehealth: Payer: Self-pay | Admitting: *Deleted

## 2014-06-24 ENCOUNTER — Encounter: Payer: Self-pay | Admitting: *Deleted

## 2014-06-24 NOTE — Telephone Encounter (Signed)
Per Dr. Marko Plume, patient can reduce steroid dose if no reaction to first taxol. Per patient's chart and confirmed with patient over the phone, she did not have any difficulty with first taxol. Instructed patient to only take 5 decadron tablets with food 12 hours prior to chemo and that she does not need to take any 6 hours before. Patient wrote down instructions and is agreeable to this.  While on the phone, I confirmed what information [facility] needed for her leave of absence. Patient states that they only need a letter stating that patient is under current treatment and the  approximate end date of treatment. Letter can be faxed to [phone] attn: Vista Mink, Human Resources. Told patient that we will fax letter for her and contact her if we have any further questions.

## 2014-06-28 ENCOUNTER — Other Ambulatory Visit: Payer: Self-pay | Admitting: Oncology

## 2014-06-29 ENCOUNTER — Encounter: Payer: Self-pay | Admitting: Oncology

## 2014-06-29 ENCOUNTER — Other Ambulatory Visit: Payer: Self-pay | Admitting: Oncology

## 2014-06-29 ENCOUNTER — Ambulatory Visit (HOSPITAL_BASED_OUTPATIENT_CLINIC_OR_DEPARTMENT_OTHER): Payer: 59

## 2014-06-29 ENCOUNTER — Other Ambulatory Visit (HOSPITAL_BASED_OUTPATIENT_CLINIC_OR_DEPARTMENT_OTHER): Payer: 59

## 2014-06-29 ENCOUNTER — Ambulatory Visit (HOSPITAL_BASED_OUTPATIENT_CLINIC_OR_DEPARTMENT_OTHER): Payer: 59 | Admitting: Oncology

## 2014-06-29 DIAGNOSIS — C3432 Malignant neoplasm of lower lobe, left bronchus or lung: Secondary | ICD-10-CM | POA: Diagnosis not present

## 2014-06-29 DIAGNOSIS — I878 Other specified disorders of veins: Secondary | ICD-10-CM

## 2014-06-29 DIAGNOSIS — C792 Secondary malignant neoplasm of skin: Secondary | ICD-10-CM | POA: Diagnosis not present

## 2014-06-29 DIAGNOSIS — Z5111 Encounter for antineoplastic chemotherapy: Secondary | ICD-10-CM

## 2014-06-29 DIAGNOSIS — C519 Malignant neoplasm of vulva, unspecified: Secondary | ICD-10-CM

## 2014-06-29 DIAGNOSIS — L259 Unspecified contact dermatitis, unspecified cause: Secondary | ICD-10-CM | POA: Diagnosis not present

## 2014-06-29 DIAGNOSIS — C7801 Secondary malignant neoplasm of right lung: Secondary | ICD-10-CM

## 2014-06-29 DIAGNOSIS — C7802 Secondary malignant neoplasm of left lung: Secondary | ICD-10-CM

## 2014-06-29 LAB — COMPREHENSIVE METABOLIC PANEL (CC13)
ALT: 19 U/L (ref 0–55)
ANION GAP: 13 meq/L — AB (ref 3–11)
AST: 10 U/L (ref 5–34)
Albumin: 3.5 g/dL (ref 3.5–5.0)
Alkaline Phosphatase: 92 U/L (ref 40–150)
BUN: 13.3 mg/dL (ref 7.0–26.0)
CHLORIDE: 105 meq/L (ref 98–109)
CO2: 23 meq/L (ref 22–29)
Calcium: 9.8 mg/dL (ref 8.4–10.4)
Creatinine: 0.8 mg/dL (ref 0.6–1.1)
EGFR: 85 mL/min/{1.73_m2} — AB (ref >90)
Glucose: 194 mg/dl — ABNORMAL HIGH (ref 70–140)
POTASSIUM: 4.1 meq/L (ref 3.5–5.1)
Sodium: 141 mEq/L (ref 136–145)
Total Protein: 6.9 g/dL (ref 6.4–8.3)

## 2014-06-29 LAB — CBC WITH DIFFERENTIAL/PLATELET
BASO%: 0.3 % (ref 0.0–2.0)
Basophils Absolute: 0 10*3/uL (ref 0.0–0.1)
EOS%: 0.1 % (ref 0.0–7.0)
Eosinophils Absolute: 0 10*3/uL (ref 0.0–0.5)
HEMATOCRIT: 36.9 % (ref 34.8–46.6)
HGB: 11.9 g/dL (ref 11.6–15.9)
LYMPH#: 0.7 10*3/uL — AB (ref 0.9–3.3)
LYMPH%: 14.3 % (ref 14.0–49.7)
MCH: 32 pg (ref 25.1–34.0)
MCHC: 32.3 g/dL (ref 31.5–36.0)
MCV: 99 fL (ref 79.5–101.0)
MONO#: 0.1 10*3/uL (ref 0.1–0.9)
MONO%: 1.3 % (ref 0.0–14.0)
NEUT%: 84 % — ABNORMAL HIGH (ref 38.4–76.8)
NEUTROS ABS: 4.4 10*3/uL (ref 1.5–6.5)
PLATELETS: 210 10*3/uL (ref 145–400)
RBC: 3.72 10*6/uL (ref 3.70–5.45)
RDW: 15.3 % — AB (ref 11.2–14.5)
WBC: 5.2 10*3/uL (ref 3.9–10.3)

## 2014-06-29 LAB — MAGNESIUM (CC13): Magnesium: 2.1 mg/dl (ref 1.5–2.5)

## 2014-06-29 MED ORDER — DEXAMETHASONE SODIUM PHOSPHATE 20 MG/5ML IJ SOLN
12.0000 mg | Freq: Once | INTRAMUSCULAR | Status: AC
  Administered 2014-06-29: 12 mg via INTRAVENOUS

## 2014-06-29 MED ORDER — SODIUM CHLORIDE 0.9 % IV SOLN
150.0000 mg | Freq: Once | INTRAVENOUS | Status: AC
  Administered 2014-06-29: 150 mg via INTRAVENOUS
  Filled 2014-06-29: qty 5

## 2014-06-29 MED ORDER — DEXAMETHASONE SODIUM PHOSPHATE 20 MG/5ML IJ SOLN
INTRAMUSCULAR | Status: AC
  Filled 2014-06-29: qty 5

## 2014-06-29 MED ORDER — PACLITAXEL CHEMO INJECTION 300 MG/50ML
80.0000 mg/m2 | Freq: Once | INTRAVENOUS | Status: AC
  Administered 2014-06-29: 126 mg via INTRAVENOUS
  Filled 2014-06-29: qty 21

## 2014-06-29 MED ORDER — DIPHENHYDRAMINE HCL 50 MG/ML IJ SOLN
INTRAMUSCULAR | Status: AC
  Filled 2014-06-29: qty 1

## 2014-06-29 MED ORDER — FAMOTIDINE IN NACL 20-0.9 MG/50ML-% IV SOLN
INTRAVENOUS | Status: AC
  Filled 2014-06-29: qty 50

## 2014-06-29 MED ORDER — FAMOTIDINE IN NACL 20-0.9 MG/50ML-% IV SOLN
20.0000 mg | Freq: Once | INTRAVENOUS | Status: AC
  Administered 2014-06-29: 20 mg via INTRAVENOUS

## 2014-06-29 MED ORDER — DIPHENHYDRAMINE HCL 50 MG/ML IJ SOLN
50.0000 mg | Freq: Once | INTRAMUSCULAR | Status: AC
  Administered 2014-06-29: 50 mg via INTRAVENOUS

## 2014-06-29 MED ORDER — ACETAMINOPHEN 325 MG PO TABS
ORAL_TABLET | ORAL | Status: AC
  Filled 2014-06-29: qty 2

## 2014-06-29 MED ORDER — SODIUM CHLORIDE 0.9 % IV SOLN
Freq: Once | INTRAVENOUS | Status: AC
  Administered 2014-06-29: 10:00:00 via INTRAVENOUS

## 2014-06-29 MED ORDER — PALONOSETRON HCL INJECTION 0.25 MG/5ML
0.2500 mg | Freq: Once | INTRAVENOUS | Status: AC
  Administered 2014-06-29: 0.25 mg via INTRAVENOUS

## 2014-06-29 MED ORDER — PALONOSETRON HCL INJECTION 0.25 MG/5ML
INTRAVENOUS | Status: AC
  Filled 2014-06-29: qty 5

## 2014-06-29 MED ORDER — POTASSIUM CHLORIDE 2 MEQ/ML IV SOLN
Freq: Once | INTRAVENOUS | Status: AC
  Administered 2014-06-29: 10:00:00 via INTRAVENOUS
  Filled 2014-06-29: qty 10

## 2014-06-29 MED ORDER — ACETAMINOPHEN 325 MG PO TABS
650.0000 mg | ORAL_TABLET | Freq: Once | ORAL | Status: AC
  Administered 2014-06-29: 650 mg via ORAL

## 2014-06-29 MED ORDER — SODIUM CHLORIDE 0.9 % IV SOLN
40.0000 mg/m2 | Freq: Once | INTRAVENOUS | Status: AC
  Administered 2014-06-29: 63 mg via INTRAVENOUS
  Filled 2014-06-29: qty 63

## 2014-06-29 NOTE — Progress Notes (Signed)
OFFICE PROGRESS NOTE   June 29, 2014   Physicians:Emma Rossi/ W.Skeet Latch ,[name], Alfredia Ferguson.[place] (PCP)  INTERVAL HISTORY:  Patient is seen in infusion area, receiving second weekly CDDP taxol for progressive metastatic squamous cell carcinoma involving lungs, which is either from known metastatic vulvar carcinoma or possibly from second primary lung carcinoma. Course has recently been complicated by severe dermatitis.  Patient did well this past week following initial CDDP taxol other than some flare of the dermatitis and extremely dry skin associated with this. The recurrent erythema and pruritis arms and lateral trunk is better with steroids used last pm as premed for taxol. She is using only the creams from dermatologist and is avoiding hot or prolonged showers. She has minimal discomfort now on perineum and denies increased SOB or other respiratory symptoms. She has had no significant nausea. She denies increased peripheral neuropathy. She is for [facility] by IR 07-07-14, as peripheral access is progressively more difficult.   PAC to be placed by IR next week Flu vaccine done  ONCOLOGIC HISTORY Patient presented with gradually enlarging mass at right vulva for ~ 8 months, uncomfortable with direct pressure and ulcerated with occasional bleeding. She was seen in ED on 02-02-14 with right inguinal adenopathy and right vulvar masses, then seen by Dr Elonda Husky on 02-03-14, clinically with vulvar carcinoma. She was then seen in consultation by Dr Skeet Latch on 02-05-14, with biopsy of both vulvar mass and satellite lesions on inner thigh and mons. Her exam found 7 cm right vulvar mass with 4 cm extension to distal vagina, extending anteriorly to urethral meatus and in ischiorectal area, with satellite lesions inner thigh and right mons. Pathology [phone]) showed invasive squamous cell carcinoma arising in background of high grade squamous intraepithelial lesion  from vulva biopsy and invasive moderately differentiated squamous cell carcinoma from inner thigh lesion. CXR 02-06-14 showed left lower lobe lung mass 3.6 x 3.6 x 3.6 cm, with no adenopathy and no bony lesions, emphysematous changes. CT chest 02-09-14 showed the lower lobe mass 3.7 x 3.5 x 3.3 cm as well as 3 mm RLL nodule and faint 4 mm LUL nodule, no mediastinal or hilar nodes, no axillary or supraclavicular nodes, and upper abdomen not remarkable. PET 02-18-14 measured LLL lung mass 4.6 x 3.9 cm, hypermetabolic, and noted multiple subcentimeter nodules scattered thruout lungs bilaterally; the right vulvar mass was also hypermetabolic, extending up into right side of lower vagina, extensive R>L and right external iliac adenopathy, no uptake in skeleton. 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 After initial consideration of bilateral inguinal lymphadenectomy, plan subsequently changed to radiation with sensitizing CDDP. She had 3 stereotactic body radiation treatments to the LLL pulmonary mass 54 Gy in 3 fractions 10-16, 10-19 and 03-04-14. She received IMRT to vulvar area from 03-09-14, completed 04-23-14, with weekly CDDP as radiation sensitizer, 7 cycles of CDDP 03-09-14 thru 04-20-14. Radiation to vulva, vaginal, and pelvic/inguinal nodes was 60 Gy in 30 fractions to gross disease, 54 Gy in 30 fractions to high risk areas, 50.4Gy in 30 fractions to intermediate risk areas. She had vulvar, vaginal, and nodal boost of 6 Gy in 3 fractions to gross disease. Follow up CT chest 05-04-14 had cavitary, spiculated LLL mass 3.3 x 2.3 cm, having been 4.6 x 3.9 cm prior to IMRT, and no change in scattered small bilateral pulmonary nodules, however PET 05-27-14 had progression in bilateral pulmonary nodules  and partial response in local vulvar involvement. She began CDDP taxol on 06-22-14, as weekly treatments initially.    Review of systems as  above, also: No fever or symptoms of infection. No chest pain or productive cough. Peripheral IV obtained first try for chemo today, site ok. Remainder of 10 point Review of Systems negative.  Objective:  Vital signs in last 24 hours: BP 129/67, HR 83 regular, resp 18 not labored RA, temp 98.2  Alert, oriented and appropriate, looks comfortable in recliner in infusion area.  HEENT:PERRL, sclerae not icteric. Oral mucosa moist without lesions.  Neck supple. No JVD.  Lymphatics:no cervical,supraclavicular adenopathy Resp: clear to auscultation bilaterally  Cardio: regular rate and rhythm. No gallop. GI: soft, nontender, not distended, no mass or organomegaly. Normally active bowel sounds. Could not examine perineum due to visit in infusion area Musculoskeletal/ Extremities: without pitting edema, cords, tenderness Neuro: no peripheral neuropathy. Otherwise nonfocal Skin dry desquamation lateral abdomen. Dull erythema upper arms and right lateral abdomen. No vesicles, no evidence of suprainfection Peripheral IV left hand site ok  Lab Results:  Results for orders placed or performed in visit on 06/29/14  CBC with Differential  Result Value Ref Range   WBC 5.2 3.9 - 10.3 10e3/uL   NEUT# 4.4 1.5 - 6.5 10e3/uL   HGB 11.9 11.6 - 15.9 g/dL   HCT 36.9 34.8 - 46.6 %   Platelets 210 145 - 400 10e3/uL   MCV 99.0 79.5 - 101.0 fL   MCH 32.0 25.1 - 34.0 pg   MCHC 32.3 31.5 - 36.0 g/dL   RBC 3.72 3.70 - 5.45 10e6/uL   RDW 15.3 (H) 11.2 - 14.5 %   lymph# 0.7 (L) 0.9 - 3.3 10e3/uL   MONO# 0.1 0.1 - 0.9 10e3/uL   Eosinophils Absolute 0.0 0.0 - 0.5 10e3/uL   Basophils Absolute 0.0 0.0 - 0.1 10e3/uL   NEUT% 84.0 (H) 38.4 - 76.8 %   LYMPH% 14.3 14.0 - 49.7 %   MONO% 1.3 0.0 - 14.0 %   EOS% 0.1 0.0 - 7.0 %   BASO% 0.3 0.0 - 2.0 %  Comprehensive metabolic panel (Cmet) - [facility]  Result Value Ref Range   Sodium 141 136 - 145 mEq/L   Potassium 4.1 3.5 - 5.1 mEq/L   Chloride 105 98 - 109 mEq/L    CO2 23 22 - 29 mEq/L   Glucose 194 (H) 70 - 140 mg/dl   BUN 13.3 7.0 - 26.0 mg/dL   Creatinine 0.8 0.6 - 1.1 mg/dL   Total Bilirubin <0.20 0.20 - 1.20 mg/dL   Alkaline Phosphatase 92 40 - 150 U/L   AST 10 5 - 34 U/L   ALT 19 0 - 55 U/L   Total Protein 6.9 6.4 - 8.3 g/dL   Albumin 3.5 3.5 - 5.0 g/dL   Calcium 9.8 8.4 - 10.4 mg/dL   Anion Gap 13 (H) 3 - 11 mEq/L   EGFR 85 (L) >90 ml/min/1.73 m2  Magnesium  Result Value Ref Range   Magnesium 2.1 1.5 - 2.5 mg/dl     Studies/Results:  No results found.  Medications: I have reviewed the patient's current medications.  DISCUSSION: she does not have scheduled follow up with dermatology, but will contact Dr Juel Burrow office if dermatitis flares again.    Letter faxed to her employer for extension of leave.  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 Improved local disease with RT/ sensitizing CDDP. Continue treatment with weekly CDDP/ taxol. 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 Bilateral subcentimeter pulmonary nodules increased by recent PET as compared with CT chest in Dec. Will continue treatment with CDDP taxol as we try to improve/ control metastatic disease.  3.severe eczema flare, apparently triggered by different detergent. Improved but not resolved, may need follow up with dermatology 4.transient speech difficulty with numbness of lip andleft hand early Dec. Neurology involved, no metastatic disease found. On ASA 5.poor venous access: for PAC by IR 6.history migraine HAs. No HA with zofran.  7.allergy PCN  8.BTL  9. Advance Directives done 10.flu vaccine given  11.anemia related to chemo and pelvic RT: continue oral  iron. 12.long past tobacco  Patient knows to call if needed prior to next scheduled visit. Chemo orders confirmed.   Jody Beard P, MD   06/29/2014, 12:59 PM

## 2014-06-29 NOTE — Patient Instructions (Signed)
[facility] Discharge Instructions for Patients Receiving Chemotherapy  Today you received the following chemotherapy agents Cisplatin, taxol  To help prevent nausea and vomiting after your treatment, we encourage you to take your nausea medication as directed   If you develop nausea and vomiting that is not controlled by your nausea medication, call the clinic.   BELOW ARE SYMPTOMS THAT SHOULD BE REPORTED IMMEDIATELY:  *FEVER GREATER THAN 100.5 F  *CHILLS WITH OR WITHOUT FEVER  NAUSEA AND VOMITING THAT IS NOT CONTROLLED WITH YOUR NAUSEA MEDICATION  *UNUSUAL SHORTNESS OF BREATH  *UNUSUAL BRUISING OR BLEEDING  TENDERNESS IN MOUTH AND THROAT WITH OR WITHOUT PRESENCE OF ULCERS  *URINARY PROBLEMS  *BOWEL PROBLEMS  UNUSUAL RASH Items with * indicate a potential emergency and should be followed up as soon as possible.  Feel free to call the clinic you have any questions or concerns. The clinic phone number is [phone].

## 2014-06-29 NOTE — Telephone Encounter (Signed)
Faxed letter requested to Ms. Burnett as noted below.

## 2014-06-29 NOTE — Progress Notes (Signed)
notified Dr Marko Plume BS 194. Pt advised she ate pizza at 2am, pasta for dinner. Okay to treat per MD

## 2014-06-29 NOTE — Progress Notes (Signed)
Patient voided 850 cc prior to giving premeds for chemo.

## 2014-06-30 ENCOUNTER — Other Ambulatory Visit: Payer: Self-pay | Admitting: Oncology

## 2014-07-02 ENCOUNTER — Ambulatory Visit: Payer: 59 | Admitting: Oncology

## 2014-07-03 ENCOUNTER — Other Ambulatory Visit: Payer: Self-pay | Admitting: Radiology

## 2014-07-03 ENCOUNTER — Telehealth: Payer: Self-pay | Admitting: *Deleted

## 2014-07-03 NOTE — Telephone Encounter (Addendum)
Called and spoke with patient - she states she did not have any problems with last chemo treatment. Told patient to take 5 decadron tablets (=20mg ) 12 hours before treatment as she did last time - patient agreeable to this. Patient states she has 5 tablets left so that will get her through this treatment. Confirmed appt times with patient as well while on the phone.

## 2014-07-03 NOTE — Telephone Encounter (Signed)
Patient called requesting dexamethasone use for 07-06-2014 treatment.  Collaborative nurse notified.

## 2014-07-05 ENCOUNTER — Other Ambulatory Visit: Payer: Self-pay | Admitting: Oncology

## 2014-07-06 ENCOUNTER — Encounter: Payer: Self-pay | Admitting: Oncology

## 2014-07-06 ENCOUNTER — Other Ambulatory Visit: Payer: Self-pay | Admitting: Radiology

## 2014-07-06 ENCOUNTER — Ambulatory Visit (HOSPITAL_BASED_OUTPATIENT_CLINIC_OR_DEPARTMENT_OTHER): Payer: 59 | Admitting: Oncology

## 2014-07-06 ENCOUNTER — Ambulatory Visit (HOSPITAL_BASED_OUTPATIENT_CLINIC_OR_DEPARTMENT_OTHER): Payer: 59

## 2014-07-06 ENCOUNTER — Other Ambulatory Visit (HOSPITAL_BASED_OUTPATIENT_CLINIC_OR_DEPARTMENT_OTHER): Payer: 59

## 2014-07-06 DIAGNOSIS — C7802 Secondary malignant neoplasm of left lung: Secondary | ICD-10-CM

## 2014-07-06 DIAGNOSIS — C792 Secondary malignant neoplasm of skin: Secondary | ICD-10-CM | POA: Diagnosis not present

## 2014-07-06 DIAGNOSIS — D6481 Anemia due to antineoplastic chemotherapy: Secondary | ICD-10-CM

## 2014-07-06 DIAGNOSIS — C778 Secondary and unspecified malignant neoplasm of lymph nodes of multiple regions: Secondary | ICD-10-CM

## 2014-07-06 DIAGNOSIS — C519 Malignant neoplasm of vulva, unspecified: Secondary | ICD-10-CM

## 2014-07-06 DIAGNOSIS — L259 Unspecified contact dermatitis, unspecified cause: Secondary | ICD-10-CM

## 2014-07-06 DIAGNOSIS — T801XXA Vascular complications following infusion, transfusion and therapeutic injection, initial encounter: Secondary | ICD-10-CM

## 2014-07-06 DIAGNOSIS — C3432 Malignant neoplasm of lower lobe, left bronchus or lung: Secondary | ICD-10-CM

## 2014-07-06 DIAGNOSIS — Z5111 Encounter for antineoplastic chemotherapy: Secondary | ICD-10-CM

## 2014-07-06 DIAGNOSIS — C7801 Secondary malignant neoplasm of right lung: Secondary | ICD-10-CM

## 2014-07-06 DIAGNOSIS — T451X5A Adverse effect of antineoplastic and immunosuppressive drugs, initial encounter: Secondary | ICD-10-CM

## 2014-07-06 DIAGNOSIS — I878 Other specified disorders of veins: Secondary | ICD-10-CM

## 2014-07-06 LAB — COMPREHENSIVE METABOLIC PANEL (CC13)
ALK PHOS: 85 U/L (ref 40–150)
ALT: 10 U/L (ref 0–55)
AST: 8 U/L (ref 5–34)
Albumin: 3.2 g/dL — ABNORMAL LOW (ref 3.5–5.0)
Anion Gap: 12 mEq/L — ABNORMAL HIGH (ref 3–11)
BUN: 13.7 mg/dL (ref 7.0–26.0)
CALCIUM: 9.7 mg/dL (ref 8.4–10.4)
CO2: 21 mEq/L — ABNORMAL LOW (ref 22–29)
Chloride: 104 mEq/L (ref 98–109)
Creatinine: 0.8 mg/dL (ref 0.6–1.1)
EGFR: 80 mL/min/{1.73_m2} — ABNORMAL LOW (ref >90)
Glucose: 146 mg/dl — ABNORMAL HIGH (ref 70–140)
Potassium: 4.3 mEq/L (ref 3.5–5.1)
SODIUM: 137 meq/L (ref 136–145)
TOTAL PROTEIN: 6.6 g/dL (ref 6.4–8.3)
Total Bilirubin: 0.22 mg/dL (ref 0.20–1.20)

## 2014-07-06 LAB — CBC WITH DIFFERENTIAL/PLATELET
BASO%: 0.3 % (ref 0.0–2.0)
BASOS ABS: 0 10*3/uL (ref 0.0–0.1)
EOS%: 0 % (ref 0.0–7.0)
Eosinophils Absolute: 0 10*3/uL (ref 0.0–0.5)
HEMATOCRIT: 33.2 % — AB (ref 34.8–46.6)
HEMOGLOBIN: 11 g/dL — AB (ref 11.6–15.9)
LYMPH%: 17.7 % (ref 14.0–49.7)
MCH: 32.2 pg (ref 25.1–34.0)
MCHC: 33.1 g/dL (ref 31.5–36.0)
MCV: 97.1 fL (ref 79.5–101.0)
MONO#: 0.2 10*3/uL (ref 0.1–0.9)
MONO%: 4.4 % (ref 0.0–14.0)
NEUT#: 2.9 10*3/uL (ref 1.5–6.5)
NEUT%: 77.6 % — AB (ref 38.4–76.8)
NRBC: 0 % (ref 0–0)
Platelets: 198 10*3/uL (ref 145–400)
RBC: 3.42 10*6/uL — AB (ref 3.70–5.45)
RDW: 14.3 % (ref 11.2–14.5)
WBC: 3.7 10*3/uL — AB (ref 3.9–10.3)
lymph#: 0.7 10*3/uL — ABNORMAL LOW (ref 0.9–3.3)

## 2014-07-06 LAB — MAGNESIUM (CC13): Magnesium: 2.1 mg/dl (ref 1.5–2.5)

## 2014-07-06 MED ORDER — ACETAMINOPHEN 325 MG PO TABS
ORAL_TABLET | ORAL | Status: AC
  Filled 2014-07-06: qty 2

## 2014-07-06 MED ORDER — PACLITAXEL CHEMO INJECTION 300 MG/50ML
80.0000 mg/m2 | Freq: Once | INTRAVENOUS | Status: AC
  Administered 2014-07-06: 126 mg via INTRAVENOUS
  Filled 2014-07-06: qty 21

## 2014-07-06 MED ORDER — PALONOSETRON HCL INJECTION 0.25 MG/5ML
INTRAVENOUS | Status: AC
  Filled 2014-07-06: qty 5

## 2014-07-06 MED ORDER — ACETAMINOPHEN 325 MG PO TABS
650.0000 mg | ORAL_TABLET | Freq: Once | ORAL | Status: AC
  Administered 2014-07-06: 650 mg via ORAL

## 2014-07-06 MED ORDER — POTASSIUM CHLORIDE 2 MEQ/ML IV SOLN
Freq: Once | INTRAVENOUS | Status: AC
  Administered 2014-07-06: 11:00:00 via INTRAVENOUS
  Filled 2014-07-06: qty 10

## 2014-07-06 MED ORDER — LIDOCAINE-PRILOCAINE 2.5-2.5 % EX CREA: TOPICAL_CREAM | CUTANEOUS | Status: AC

## 2014-07-06 MED ORDER — PALONOSETRON HCL INJECTION 0.25 MG/5ML
0.2500 mg | Freq: Once | INTRAVENOUS | Status: AC
  Administered 2014-07-06: 0.25 mg via INTRAVENOUS

## 2014-07-06 MED ORDER — DEXAMETHASONE SODIUM PHOSPHATE 20 MG/5ML IJ SOLN
INTRAMUSCULAR | Status: AC
  Filled 2014-07-06: qty 5

## 2014-07-06 MED ORDER — DEXAMETHASONE SODIUM PHOSPHATE 20 MG/5ML IJ SOLN
12.0000 mg | Freq: Once | INTRAMUSCULAR | Status: AC
  Administered 2014-07-06: 12 mg via INTRAVENOUS

## 2014-07-06 MED ORDER — FAMOTIDINE IN NACL 20-0.9 MG/50ML-% IV SOLN
INTRAVENOUS | Status: AC
  Filled 2014-07-06: qty 50

## 2014-07-06 MED ORDER — SODIUM CHLORIDE 0.9 % IV SOLN
Freq: Once | INTRAVENOUS | Status: AC
  Administered 2014-07-06: 11:00:00 via INTRAVENOUS

## 2014-07-06 MED ORDER — DIPHENHYDRAMINE HCL 50 MG/ML IJ SOLN
50.0000 mg | Freq: Once | INTRAMUSCULAR | Status: AC
  Administered 2014-07-06: 50 mg via INTRAVENOUS

## 2014-07-06 MED ORDER — SODIUM CHLORIDE 0.9 % IV SOLN
40.0000 mg/m2 | Freq: Once | INTRAVENOUS | Status: AC
  Administered 2014-07-06: 63 mg via INTRAVENOUS
  Filled 2014-07-06: qty 63

## 2014-07-06 MED ORDER — SODIUM CHLORIDE 0.9 % IV SOLN
150.0000 mg | Freq: Once | INTRAVENOUS | Status: AC
  Administered 2014-07-06: 150 mg via INTRAVENOUS
  Filled 2014-07-06: qty 5

## 2014-07-06 MED ORDER — DIPHENHYDRAMINE HCL 50 MG/ML IJ SOLN
INTRAMUSCULAR | Status: AC
  Filled 2014-07-06: qty 1

## 2014-07-06 MED ORDER — FAMOTIDINE IN NACL 20-0.9 MG/50ML-% IV SOLN
20.0000 mg | Freq: Once | INTRAVENOUS | Status: AC
  Administered 2014-07-06: 20 mg via INTRAVENOUS

## 2014-07-06 NOTE — Patient Instructions (Signed)
[place] Discharge Instructions for Patients Receiving Chemotherapy  Today you received the following chemotherapy agents Cisplatin/Paclitaxel.  To help prevent nausea and vomiting after your treatment, we encourage you to take your nausea medication as directed.    If you develop nausea and vomiting that is not controlled by your nausea medication, call the clinic.   BELOW ARE SYMPTOMS THAT SHOULD BE REPORTED IMMEDIATELY:  *FEVER GREATER THAN 100.5 F  *CHILLS WITH OR WITHOUT FEVER  NAUSEA AND VOMITING THAT IS NOT CONTROLLED WITH YOUR NAUSEA MEDICATION  *UNUSUAL SHORTNESS OF BREATH  *UNUSUAL BRUISING OR BLEEDING  TENDERNESS IN MOUTH AND THROAT WITH OR WITHOUT PRESENCE OF ULCERS  *URINARY PROBLEMS  *BOWEL PROBLEMS  UNUSUAL RASH Items with * indicate a potential emergency and should be followed up as soon as possible.  Feel free to call the clinic you have any questions or concerns. The clinic phone number is [phone].

## 2014-07-06 NOTE — Progress Notes (Signed)
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 She completed RT to primary vulvar disease 04-23-14, given with sensitizing CDDP. Present treatment began 06-22-14 due to progression in lungs. Situation has been complicated by severe dermatitis in Jan 2016. She is to see Dr Isidore Moos on 08-21-14 and Dr Denman George on 08-31-14.  Patient continues to tolerate present chemotherapy well, tho IV access difficult again today (EMEND infiltrated with IV in left forearm, IV access restarted in right antecubital by Bonnita Nasuti). Nausea has been well controlled with present antiemetics. She denies increased SOB or cough, or any chest pain. She is still much less symptomatic at perineum than prior to radiation. Bowels are moving and she is able to eat. The dermatitis flared on LUE and inner thighs last week, no obvious reason. Steroids days of chemo improve this transiently. She has not had follow up with dermatology   PAC to be placed by IR on 07-07-14 Flu vaccine done.  ONCOLOGIC HISTORY Patient presented with gradually enlarging mass at right vulva for ~ 8 months, uncomfortable with direct pressure and ulcerated with occasional bleeding. She was seen in ED on 02-02-14 with right inguinal adenopathy and right vulvar masses, then seen by Dr Elonda Husky on 02-03-14, clinically with vulvar carcinoma. She was then seen in consultation by Dr Skeet Latch on 02-05-14, with biopsy of both vulvar mass and satellite lesions on inner thigh and mons. Her exam found 7 cm right vulvar mass with 4 cm extension to distal vagina, extending anteriorly to urethral meatus and in  ischiorectal area, with satellite lesions inner thigh and right mons. Pathology [phone]) showed invasive squamous cell carcinoma arising in background of high grade squamous intraepithelial lesion from vulva biopsy and invasive moderately differentiated squamous cell carcinoma from inner thigh lesion. CXR 02-06-14 showed left lower lobe lung mass 3.6 x 3.6 x 3.6 cm, with no adenopathy and no bony lesions, emphysematous changes. CT chest 02-09-14 showed the lower lobe mass 3.7 x 3.5 x 3.3 cm as well as 3 mm RLL nodule and faint 4 mm LUL nodule, no mediastinal or hilar nodes, no axillary or supraclavicular nodes, and upper abdomen not remarkable. PET 02-18-14 measured LLL lung mass 4.6 x 3.9 cm, hypermetabolic, and noted multiple subcentimeter nodules scattered thruout lungs bilaterally; the right vulvar mass was also hypermetabolic, extending up into right side of lower vagina, extensive R>L and right external iliac adenopathy, no uptake in skeleton. 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 After initial consideration of bilateral inguinal lymphadenectomy, plan subsequently changed to radiation with sensitizing CDDP. She had 3 stereotactic body radiation treatments to the LLL pulmonary mass 54 Gy in 3 fractions 10-16, 10-19 and 03-04-14. She received IMRT to vulvar area from 03-09-14, completed 04-23-14, with weekly CDDP as radiation sensitizer, 7 cycles of CDDP 03-09-14 thru 04-20-14. Radiation to vulva, vaginal, and pelvic/inguinal nodes was 60 Gy in 30 fractions to gross disease, 54 Gy in 30 fractions to high risk areas, 50.4Gy in 30 fractions to intermediate risk areas. She had vulvar, vaginal, and nodal boost of 6 Gy in 3 fractions to gross disease. Follow up CT  chest 05-04-14 had cavitary, spiculated LLL mass 3.3 x 2.3 cm, having been 4.6 x 3.9 cm prior to IMRT, and no change in scattered small bilateral pulmonary nodules, however PET  05-27-14 had progression in bilateral pulmonary nodules and partial response in local vulvar involvement. She began CDDP taxol on 06-22-14, as weekly treatments initially.    Review of systems as above, also: No fever or symptoms of infection. No bleeding. No LE swelling. Remainder of 10 point Review of Systems negative.  Objective:  Vital signs in last 24 hours: 114/63, 68 regular, 18 not labored RA, 98.3 oral, 100% sat  Alert, oriented and appropriate. Ambulatory into office without difficulty.  No alopecia  HEENT:PERRL, sclerae not icteric. Oral mucosa moist without lesions, posterior pharynx clear.  Neck supple. No JVD.  Lymphatics:no cervical,supraclavicular adenopathy Resp: clear to auscultation bilaterally Cardio: regular rate and rhythm. No gallop. GI: soft, nontender, not distended. Normally active bowel sounds.  Musculoskeletal/ Extremities: LE without pitting edema, cords, tenderness. Left forearm with erythema 4x6 cm at site of IV infiltration, slightly tender, no other swelling. IV infusing right antecubital site not remarkable Neuro: no change peripheral neuropathy. Otherwise nonfocal Skin without ecchymosis, petechiae. Skin less dry and less erythematous lateral trunk.   Lab Results:  Results for orders placed or performed in visit on 07/06/14  CBC with Differential  Result Value Ref Range   WBC 3.7 (L) 3.9 - 10.3 10e3/uL   NEUT# 2.9 1.5 - 6.5 10e3/uL   HGB 11.0 (L) 11.6 - 15.9 g/dL   HCT 33.2 (L) 34.8 - 46.6 %   Platelets 198 145 - 400 10e3/uL   MCV 97.1 79.5 - 101.0 fL   MCH 32.2 25.1 - 34.0 pg   MCHC 33.1 31.5 - 36.0 g/dL   RBC 3.42 (L) 3.70 - 5.45 10e6/uL   RDW 14.3 11.2 - 14.5 %   lymph# 0.7 (L) 0.9 - 3.3 10e3/uL   MONO# 0.2 0.1 - 0.9 10e3/uL   Eosinophils Absolute 0.0 0.0 - 0.5 10e3/uL   Basophils Absolute 0.0 0.0 - 0.1 10e3/uL   NEUT% 77.6 (H) 38.4 - 76.8 %   LYMPH% 17.7 14.0 - 49.7 %   MONO% 4.4 0.0 - 14.0 %   EOS% 0.0 0.0 - 7.0 %   BASO% 0.3 0.0  - 2.0 %   nRBC 0 0 - 0 %  Comprehensive metabolic panel (Cmet) - [facility]  Result Value Ref Range   Sodium 137 136 - 145 mEq/L   Potassium 4.3 3.5 - 5.1 mEq/L   Chloride 104 98 - 109 mEq/L   CO2 21 (L) 22 - 29 mEq/L   Glucose 146 (H) 70 - 140 mg/dl   BUN 13.7 7.0 - 26.0 mg/dL   Creatinine 0.8 0.6 - 1.1 mg/dL   Total Bilirubin 0.22 0.20 - 1.20 mg/dL   Alkaline Phosphatase 85 40 - 150 U/L   AST 8 5 - 34 U/L   ALT 10 0 - 55 U/L   Total Protein 6.6 6.4 - 8.3 g/dL   Albumin 3.2 (L) 3.5 - 5.0 g/dL   Calcium 9.7 8.4 - 10.4 mg/dL   Anion Gap 12 (H) 3 - 11 mEq/L   EGFR 80 (L) >90 ml/min/1.73 m2  Magnesium  Result Value Ref Range   Magnesium 2.1 1.5 - 2.5 mg/dl     Studies/Results:  No results found.  Medications: I have reviewed the patient's current medications.  DISCUSSION: Will plan repeat imaging of chest after ~ 6 -8 cycles unless concerns  in interim. Warm soaks to site of IV infiltration left forearm. PAC scheduled by IR on 07-07-14.  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 Improved local disease with RT/ sensitizing CDDP. Continue treatment with weekly CDDP/ taxol. 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 Bilateral subcentimeter pulmonary nodules increased by recent PET as compared with CT chest in Dec. Will continue treatment with CDDP taxol as we try to improve/ control metastatic disease, tolerating well and not more symptomatic from respiratory standpoint. Expect repeat imaging chest after 6-8 weekly treatments. 3.severe eczema flare, apparently triggered by different detergent. Improved but not resolved, may need follow up with dermatology 4.transient speech difficulty with numbness of  lip andleft hand early Dec. Neurology involved, no metastatic disease found. On ASA 5.poor venous access: for PAC by IR tomorrow. IV infiltration today as above. 6.history migraine HAs. No HA with zofran.  7.allergy PCN  8.BTL  9. 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 12.long past tobacco   Patient understands and is in agreement with plans and recommendations, and will call if needed prior to next scheduled appointments. Chemo orders entered. Time spent 25 min including >50% counseling and coordination of care.   LIVESAY,LENNIS P, MD   07/06/2014, 2:07 PM

## 2014-07-07 ENCOUNTER — Ambulatory Visit (HOSPITAL_COMMUNITY)
Admission: RE | Admit: 2014-07-07 | Discharge: 2014-07-07 | Disposition: A | Discharge status: Home / Self Care | Payer: 59 | Source: Ambulatory Visit | Attending: Oncology | Admitting: Oncology

## 2014-07-07 ENCOUNTER — Ambulatory Visit (HOSPITAL_COMMUNITY)
Admission: RE | Admit: 2014-07-07 | Discharge: 2014-07-07 | Disposition: A | Discharge status: Home / Self Care | Payer: 59 | Source: Ambulatory Visit | Attending: Diagnostic Radiology | Admitting: Diagnostic Radiology

## 2014-07-07 ENCOUNTER — Encounter (HOSPITAL_COMMUNITY): Payer: Self-pay

## 2014-07-07 ENCOUNTER — Telehealth: Payer: Self-pay | Admitting: *Deleted

## 2014-07-07 ENCOUNTER — Other Ambulatory Visit: Payer: Self-pay | Admitting: Oncology

## 2014-07-07 ENCOUNTER — Other Ambulatory Visit: Payer: Self-pay | Admitting: *Deleted

## 2014-07-07 DIAGNOSIS — C3432 Malignant neoplasm of lower lobe, left bronchus or lung: Secondary | ICD-10-CM

## 2014-07-07 DIAGNOSIS — G43909 Migraine, unspecified, not intractable, without status migrainosus: Secondary | ICD-10-CM | POA: Diagnosis not present

## 2014-07-07 DIAGNOSIS — Z87891 Personal history of nicotine dependence: Secondary | ICD-10-CM | POA: Diagnosis not present

## 2014-07-07 DIAGNOSIS — Z79891 Long term (current) use of opiate analgesic: Secondary | ICD-10-CM | POA: Insufficient documentation

## 2014-07-07 DIAGNOSIS — Z923 Personal history of irradiation: Secondary | ICD-10-CM | POA: Insufficient documentation

## 2014-07-07 DIAGNOSIS — IMO0002 Reserved for concepts with insufficient information to code with codable children: Secondary | ICD-10-CM | POA: Insufficient documentation

## 2014-07-07 DIAGNOSIS — Z79899 Other long term (current) drug therapy: Secondary | ICD-10-CM | POA: Diagnosis not present

## 2014-07-07 DIAGNOSIS — C519 Malignant neoplasm of vulva, unspecified: Secondary | ICD-10-CM | POA: Diagnosis present

## 2014-07-07 DIAGNOSIS — C799 Secondary malignant neoplasm of unspecified site: Secondary | ICD-10-CM | POA: Insufficient documentation

## 2014-07-07 LAB — PROTIME-INR
INR: 0.97 (ref 0.00–1.49)
Prothrombin Time: 13 seconds (ref 11.6–15.2)

## 2014-07-07 LAB — BASIC METABOLIC PANEL
Anion gap: 7 (ref 5–15)
BUN: 15 mg/dL (ref 6–23)
CALCIUM: 8.9 mg/dL (ref 8.4–10.5)
CO2: 24 mmol/L (ref 19–32)
CREATININE: 0.72 mg/dL (ref 0.50–1.10)
Chloride: 110 mmol/L (ref 96–112)
GFR calc Af Amer: >90 mL/min (ref >90)
GFR calc non Af Amer: >90 mL/min (ref >90)
GLUCOSE: 94 mg/dL (ref 70–99)
Potassium: 3.4 mmol/L — ABNORMAL LOW (ref 3.5–5.1)
Sodium: 141 mmol/L (ref 135–145)

## 2014-07-07 LAB — CBC
HEMATOCRIT: 34 % — AB (ref 36.0–46.0)
Hemoglobin: 11.1 g/dL — ABNORMAL LOW (ref 12.0–15.0)
MCH: 32.3 pg (ref 26.0–34.0)
MCHC: 32.6 g/dL (ref 30.0–36.0)
MCV: 98.8 fL (ref 78.0–100.0)
Platelets: 211 10*3/uL (ref 150–400)
RBC: 3.44 MIL/uL — ABNORMAL LOW (ref 3.87–5.11)
RDW: 14.3 % (ref 11.5–15.5)
WBC: 4.5 10*3/uL (ref 4.0–10.5)

## 2014-07-07 LAB — APTT: aPTT: 25 seconds (ref 24–37)

## 2014-07-07 MED ORDER — LIDOCAINE HCL 1 % IJ SOLN
INTRAMUSCULAR | Status: AC
  Filled 2014-07-07: qty 20

## 2014-07-07 MED ORDER — HEPARIN SOD (PORK) LOCK FLUSH 100 UNIT/ML IV SOLN
INTRAVENOUS | Status: AC
  Filled 2014-07-07: qty 5

## 2014-07-07 MED ORDER — FENTANYL CITRATE 0.05 MG/ML IJ SOLN
INTRAMUSCULAR | Status: AC | PRN
  Administered 2014-07-07 (×2): 25 ug via INTRAVENOUS
  Administered 2014-07-07: 50 ug via INTRAVENOUS

## 2014-07-07 MED ORDER — FENTANYL CITRATE 0.05 MG/ML IJ SOLN
INTRAMUSCULAR | Status: AC
  Filled 2014-07-07: qty 4

## 2014-07-07 MED ORDER — MIDAZOLAM HCL 2 MG/2ML IJ SOLN
INTRAMUSCULAR | Status: AC
  Filled 2014-07-07: qty 6

## 2014-07-07 MED ORDER — LIDOCAINE-EPINEPHRINE 2 %-1:100000 IJ SOLN
INTRAMUSCULAR | Status: AC
  Filled 2014-07-07: qty 1

## 2014-07-07 MED ORDER — DEXAMETHASONE 4 MG PO TABS: ORAL_TABLET | ORAL | Status: DC

## 2014-07-07 MED ORDER — VANCOMYCIN HCL IN DEXTROSE 1-5 GM/200ML-% IV SOLN
1000.0000 mg | Freq: Once | INTRAVENOUS | Status: AC
  Administered 2014-07-07: 1000 mg via INTRAVENOUS
  Filled 2014-07-07: qty 200

## 2014-07-07 MED ORDER — HEPARIN SOD (PORK) LOCK FLUSH 100 UNIT/ML IV SOLN
INTRAVENOUS | Status: AC | PRN
  Administered 2014-07-07: 500 [IU]

## 2014-07-07 MED ORDER — SODIUM CHLORIDE 0.9 % IV SOLN
Freq: Once | INTRAVENOUS | Status: AC
  Administered 2014-07-07: 500 mL via INTRAVENOUS

## 2014-07-07 MED ORDER — MIDAZOLAM HCL 2 MG/2ML IJ SOLN
INTRAMUSCULAR | Status: AC | PRN
  Administered 2014-07-07 (×4): 1 mg via INTRAVENOUS

## 2014-07-07 NOTE — Procedures (Signed)
Placement of right jugular portacath.  Tip at superior cavoatrial junction.  No immediate complication.

## 2014-07-07 NOTE — Telephone Encounter (Signed)
Patient called to request refill for dexamethasone.  She will have treatment with Taxol on 07/13/14 and has no dex left for pre-medication for that Taxol treatment.  Refill sent to pharmacy.

## 2014-07-07 NOTE — H&P (Signed)
Chief Complaint: "I am here for a port."  Referring Physician(s): Livesay,Lennis P  History of Present Illness: Jody Beard is a 64 y.o. female with metastatic squamous cell carcinoma undergoing chemotherapy and is scheduled today for a port a catheter. She denies any chest pain, shortness of breath or palpitations. She denies any active signs of bleeding or excessive bruising. She denies any recent fever or chills. The patient denies any history of sleep apnea or chronic oxygen use. She has previously tolerated sedation without complications.    Past Medical History  Diagnosis Date  . Vulvar lesion   . Eczema   . H/O bronchitis   . Headache(784.0)     migraines years ago  . Vulvar cancer 01/2014    future radiation and chemo  . Cancer 01/2014    vulvar  . Hx of radiation therapy 03/09/14-04/23/14    vulva/vaginal, pelvic/inguinal nodes 60 Gy 30 fx, vulvar/vaginal nodal boost 6 Gy 3 fx    Past Surgical History  Procedure Laterality Date  . Vulva /perineum biopsy  02/05/14  . Tubal ligation  1980's  . Dilation and curettage of uterus  1980's    Allergies: Other and Penicillins  Medications: Prior to Admission medications   Medication Sig Start Date End Date Taking? Authorizing Provider  PRESCRIPTION MEDICATION Chemo - [place]   Yes Historical Provider, MD  dexamethasone (DECADRON) 4 MG tablet Take 5 tablets with food 12 hrs and 6 hrs prior to taxol on 06-22-14 then as directed. 06/18/14   Lennis Marion Downer, MD  diphenhydrAMINE (BENADRYL) 25 mg capsule Take 25 mg by mouth every 6 (six) hours as needed.    Historical Provider, MD  ferrous fumarate (HEMOCYTE - 106 MG FE) 325 (106 FE) MG TABS tablet Take one tablet daily on empty stomach with OJ. 05/06/14   Lennis P Livesay, MD  Halcinonide 0.1 % CREA Apply 1 application topically as needed. Apply to affected areas as needed.    Historical Provider, MD  hydrOXYzine (VISTARIL) 25 MG capsule Take 25-50 mg by mouth at bedtime as  needed for itching.    Historical Provider, MD  lidocaine-prilocaine (EMLA) cream Apply to Porta-Cath site 1-2 hours prior to access as directed. 07/06/14   Lennis Marion Downer, MD  LORazepam (ATIVAN) 0.5 MG tablet Take 0.5 mg by mouth every 6 (six) hours as needed. 03/10/14   Historical Provider, MD  ondansetron (ZOFRAN) 8 MG tablet Take 1 tablet (8 mg total) by mouth every 8 (eight) hours as needed for nausea or vomiting (will not make drowsy). 06/18/14   Lennis Marion Downer, MD  traMADol (ULTRAM) 50 MG tablet Take 1 tablet (50 mg total) by mouth every 8 (eight) hours as needed. Patient not taking: Reported on 06/04/2014 04/13/14   Gordy Levan, MD     Family History  Problem Relation Age of Onset  . Heart disease Mother   . Kidney cancer Father   . Prostate cancer Cousin     History   Social History  . Marital Status: Married    Spouse Name: Jody Beard  . Number of Children: 2  . Years of Education: 12   Social History Main Topics  . Smoking status: Former Smoker -- 1.00 packs/day for 30 years    Types: Cigarettes    Quit date: 07/13/2013  . Smokeless tobacco: Never Used     Comment: current e-cig  . Alcohol Use: No     Comment: rare  . Drug Use: No  .  Sexual Activity: No   Other Topics Concern  . None   Social History Narrative   Patient lives at home with Jody Beard her husband   Patient has a high school education   Patient has 2 children    Patient is right handed    Patient is on leave of absence from [employer]    Review of Systems: A 12 point ROS discussed and pertinent positives are indicated in the HPI above.  All other systems are negative.  Review of Systems  Vital Signs: BP 132/58 mmHg  Pulse 68  Temp(Src) 97.8 F (36.6 C) (Oral)  Resp 18  Ht 5\' 1"  (1.549 m)  Wt 127 lb (57.607 kg)  BMI 24.01 kg/m2  SpO2 100%  Physical Exam  Constitutional: She is oriented to person, place, and time. No distress.  HENT:  Head: Normocephalic and atraumatic.  Neck: No tracheal  deviation present.  Cardiovascular: Normal rate and regular rhythm.  Exam reveals no gallop and no friction rub.   No murmur heard. Pulmonary/Chest: Effort normal and breath sounds normal. No respiratory distress. She has no wheezes. She has no rales.  Abdominal: Soft. Bowel sounds are normal. She exhibits no distension. There is no tenderness.  Neurological: She is alert and oriented to person, place, and time.  Skin: She is not diaphoretic.    Mallampati Score:  MD Evaluation Airway: WNL Heart: WNL Abdomen: WNL Chest/ Lungs: WNL ASA  Classification: 3 Mallampati/Airway Score: Two  Labs:  CBC:  Recent Labs  06/18/14 1435 06/22/14 0807 06/29/14 0842 07/06/14 0911  WBC 8.5 6.5 5.2 3.7*  HGB 11.5* 12.1 11.9 11.0*  HCT 36.5 38.1 36.9 33.2*  PLT 212 241 210 198    COAGS:  Recent Labs  02/23/14 0730  INR 1.09  APTT 32    BMP:  Recent Labs  02/06/14 1110  06/04/14 1325 06/18/14 1436 06/29/14 0842 07/06/14 0911  NA 140  < > 142 145 141 137  K 4.9  < > 4.4 4.1 4.1 4.3  CL 101  --   --   --   --   --   CO2 26  < > 30* 25 23 21*  GLUCOSE 105*  < > 95 111 194* 146*  BUN 8  < > 14.0 16.3 13.3 13.7  CALCIUM 10.3  < > 8.8 9.2 9.8 9.7  CREATININE 0.72  < > 0.8 0.8 0.8 0.8  GFRNONAA >90  --   --   --   --   --   GFRAA >90  --   --   --   --   --   < > = values in this interval not displayed.  LIVER FUNCTION TESTS:  Recent Labs  06/04/14 1325 06/18/14 1436 06/29/14 0842 07/06/14 0911  BILITOT <0.20 0.24 <0.20 0.22  AST 32 18 10 8   ALT 27 23 19 10   ALKPHOS 88 96 92 85  PROT 6.6 7.2 6.9 6.6  ALBUMIN 3.1* 3.5 3.5 3.2*   Assessment and Plan: Metastatic squamous cell carcinoma undergoing chemotherapy Poor venous access Scheduled today for image guided port a catheter placement with moderate sedation Patient has been NPO, no blood thinners, afebrile, labs reviewed-on decadron given yesterday Risks and Benefits discussed with the patient. All of the  patient's questions were answered, patient is agreeable to proceed. Consent signed and in chart.   Thank you for this interesting consult.  I greatly enjoyed meeting Jody Beard and look forward to participating in their care.  SignedHedy Jacob 07/07/2014, 10:03 AM   I spent a total of 20 Minutes in face to face in clinical consultation, greater than 50% of which was counseling/coordinating care for metastatic squamous cell carcinoma.

## 2014-07-07 NOTE — Telephone Encounter (Signed)
Per staff message and POF I have scheduled appts. Advised scheduler of appts. JMW  

## 2014-07-07 NOTE — Discharge Instructions (Signed)
Implanted Port Insertion, Care After °Refer to this sheet in the next few weeks. These instructions provide you with information on caring for yourself after your procedure. Your health care provider may also give you more specific instructions. Your treatment has been planned according to current medical practices, but problems sometimes occur. Call your health care provider if you have any problems or questions after your procedure. °WHAT TO EXPECT AFTER THE PROCEDURE °After your procedure, it is typical to have the following:  °· Discomfort at the port insertion site. Ice packs to the area will help. °· Bruising on the skin over the port. This will subside in 3-4 days. °HOME CARE INSTRUCTIONS °· After your port is placed, you will get a manufacturer's information card. The card has information about your port. Keep this card with you at all times.   °· Know what kind of port you have. There are many types of ports available.   °· Wear a medical alert bracelet in case of an emergency. This can help alert health care workers that you have a port.   °· The port can stay in for as long as your health care provider believes it is necessary.   °· A home health care nurse may give medicines and take care of the port.   °· You or a family member can get special training and directions for giving medicine and taking care of the port at home.   °SEEK MEDICAL CARE IF:  °· Your port does not flush or you are unable to get a blood return.   °· You have a fever or chills. °SEEK IMMEDIATE MEDICAL CARE IF: °· You have new fluid or pus coming from your incision.   °· You notice a bad smell coming from your incision site.   °· You have swelling, pain, or more redness at the incision or port site.   °· You have chest pain or shortness of breath. °Document Released: 02/19/2013 Document Revised: 05/06/2013 Document Reviewed: 02/19/2013 °ExitCare® Patient Information ©2015 [employer]. This information is not intended to replace  advice given to you by your health care provider. Make sure you discuss any questions you have with your health care provider. °Implanted Port Home Guide °An implanted port is a type of central line that is placed under the skin. Central lines are used to provide IV access when treatment or nutrition needs to be given through a person's veins. Implanted ports are used for long-term IV access. An implanted port may be placed because:  °· You need IV medicine that would be irritating to the small veins in your hands or arms.   °· You need long-term IV medicines, such as antibiotics.   °· You need IV nutrition for a long period.   °· You need frequent blood draws for lab tests.   °· You need dialysis.   °Implanted ports are usually placed in the chest area, but they can also be placed in the upper arm, the abdomen, or the leg. An implanted port has two main parts:  °· Reservoir. The reservoir is round and will appear as a small, raised area under your skin. The reservoir is the part where a needle is inserted to give medicines or draw blood.   °· Catheter. The catheter is a thin, flexible tube that extends from the reservoir. The catheter is placed into a large vein. Medicine that is inserted into the reservoir goes into the catheter and then into the vein.   °HOW WILL I CARE FOR MY INCISION SITE? °Do not get the incision site wet. Bathe or   shower as directed by your health care provider.  °HOW IS MY PORT ACCESSED? °Special steps must be taken to access the port:  °· Before the port is accessed, a numbing cream can be placed on the skin. This helps numb the skin over the port site.   °· Your health care provider uses a sterile technique to access the port. °· Your health care provider must put on a mask and sterile gloves. °· The skin over your port is cleaned carefully with an antiseptic and allowed to dry. °· The port is gently pinched between sterile gloves, and a needle is inserted into the port. °· Only  "non-coring" port needles should be used to access the port. Once the port is accessed, a blood return should be checked. This helps ensure that the port is in the vein and is not clogged.   °· If your port needs to remain accessed for a constant infusion, a clear (transparent) bandage will be placed over the needle site. The bandage and needle will need to be changed every week, or as directed by your health care provider.   °· Keep the bandage covering the needle clean and dry. Do not get it wet. Follow your health care provider's instructions on how to take a shower or bath while the port is accessed.   °· If your port does not need to stay accessed, no bandage is needed over the port.   °WHAT IS FLUSHING? °Flushing helps keep the port from getting clogged. Follow your health care provider's instructions on how and when to flush the port. Ports are usually flushed with saline solution or a medicine called heparin. The need for flushing will depend on how the port is used.  °· If the port is used for intermittent medicines or blood draws, the port will need to be flushed:   °· After medicines have been given.   °· After blood has been drawn.   °· As part of routine maintenance.   °· If a constant infusion is running, the port may not need to be flushed.   °HOW LONG WILL MY PORT STAY IMPLANTED? °The port can stay in for as long as your health care provider thinks it is needed. When it is time for the port to come out, surgery will be done to remove it. The procedure is similar to the one performed when the port was put in.  °WHEN SHOULD I SEEK IMMEDIATE MEDICAL CARE? °When you have an implanted port, you should seek immediate medical care if:  °· You notice a bad smell coming from the incision site.   °· You have swelling, redness, or drainage at the incision site.   °· You have more swelling or pain at the port site or the surrounding area.   °· You have a fever that is not controlled with medicine. °Document  Released: 05/01/2005 Document Revised: 02/19/2013 Document Reviewed: 01/06/2013 °ExitCare® Patient Information ©2015 [employer]. This information is not intended to replace advice given to you by your health care provider. Make sure you discuss any questions you have with your health care provider. °Conscious Sedation °Sedation is the use of medicines to promote relaxation and relieve discomfort and anxiety. Conscious sedation is a type of sedation. Under conscious sedation you are less alert than normal but are still able to respond to instructions or stimulation. Conscious sedation is used during short medical and dental procedures. It is milder than deep sedation or general anesthesia and allows you to return to your regular activities sooner.  °LET YOUR HEALTH CARE PROVIDER   KNOW ABOUT:   Any allergies you have.  All medicines you are taking, including vitamins, herbs, eye drops, creams, and over-the-counter medicines.  Use of steroids (by mouth or creams).  Previous problems you or members of your family have had with the use of anesthetics.  Any blood disorders you have.  Previous surgeries you have had.  Medical conditions you have.  Possibility of pregnancy, if this applies.  Use of cigarettes, alcohol, or illegal drugs. RISKS AND COMPLICATIONS Generally, this is a safe procedure. However, as with any procedure, problems can occur. Possible problems include:  Oversedation.  Trouble breathing on your own. You may need to have a breathing tube until you are awake and breathing on your own.  Allergic reaction to any of the medicines used for the procedure. BEFORE THE PROCEDURE  You may have blood tests done. These tests can help show how well your kidneys and liver are working. They can also show how well your blood clots.  A physical exam will be done.  Only take medicines as directed by your health care provider. You may need to stop taking medicines (such as blood  thinners, aspirin, or nonsteroidal anti-inflammatory drugs) before the procedure.   Do not eat or drink at least 6 hours before the procedure or as directed by your health care provider.  Arrange for a responsible adult, family member, or friend to take you home after the procedure. He or she should stay with you for at least 24 hours after the procedure, until the medicine has worn off. PROCEDURE   An intravenous (IV) catheter will be inserted into one of your veins. Medicine will be able to flow directly into your body through this catheter. You may be given medicine through this tube to help prevent pain and help you relax.  The medical or dental procedure will be done. AFTER THE PROCEDURE  You will stay in a recovery area until the medicine has worn off. Your blood pressure and pulse will be checked.   Depending on the procedure you had, you may be allowed to go home when you can tolerate liquids and your pain is under control. Document Released: 01/24/2001 Document Revised: 05/06/2013 Document Reviewed: 01/06/2013 [facility] Patient Information 2015 [place], [place]. This information is not intended to replace advice given to you by your health care provider. Make sure you discuss any questions you have with your health care provider.

## 2014-07-10 ENCOUNTER — Other Ambulatory Visit: Payer: Self-pay

## 2014-07-10 DIAGNOSIS — C519 Malignant neoplasm of vulva, unspecified: Secondary | ICD-10-CM

## 2014-07-12 DIAGNOSIS — T801XXA Vascular complications following infusion, transfusion and therapeutic injection, initial encounter: Secondary | ICD-10-CM | POA: Insufficient documentation

## 2014-07-13 ENCOUNTER — Ambulatory Visit (HOSPITAL_BASED_OUTPATIENT_CLINIC_OR_DEPARTMENT_OTHER): Payer: 59

## 2014-07-13 ENCOUNTER — Other Ambulatory Visit (HOSPITAL_BASED_OUTPATIENT_CLINIC_OR_DEPARTMENT_OTHER): Payer: 59

## 2014-07-13 DIAGNOSIS — C519 Malignant neoplasm of vulva, unspecified: Secondary | ICD-10-CM

## 2014-07-13 DIAGNOSIS — Z5111 Encounter for antineoplastic chemotherapy: Secondary | ICD-10-CM

## 2014-07-13 LAB — CBC WITH DIFFERENTIAL/PLATELET
BASO%: 1 % (ref 0.0–2.0)
BASOS ABS: 0 10*3/uL (ref 0.0–0.1)
EOS%: 0.1 % (ref 0.0–7.0)
Eosinophils Absolute: 0 10*3/uL (ref 0.0–0.5)
HCT: 34.3 % — ABNORMAL LOW (ref 34.8–46.6)
HGB: 11.3 g/dL — ABNORMAL LOW (ref 11.6–15.9)
LYMPH#: 0.7 10*3/uL — AB (ref 0.9–3.3)
LYMPH%: 15.4 % (ref 14.0–49.7)
MCH: 31.8 pg (ref 25.1–34.0)
MCHC: 32.9 g/dL (ref 31.5–36.0)
MCV: 96.4 fL (ref 79.5–101.0)
MONO#: 0.2 10*3/uL (ref 0.1–0.9)
MONO%: 4.2 % (ref 0.0–14.0)
NEUT#: 3.8 10*3/uL (ref 1.5–6.5)
NEUT%: 79.3 % — ABNORMAL HIGH (ref 38.4–76.8)
Platelets: 218 10*3/uL (ref 145–400)
RBC: 3.55 10*6/uL — ABNORMAL LOW (ref 3.70–5.45)
RDW: 14.7 % — ABNORMAL HIGH (ref 11.2–14.5)
WBC: 4.8 10*3/uL (ref 3.9–10.3)
nRBC: 0 % (ref 0–0)

## 2014-07-13 LAB — COMPREHENSIVE METABOLIC PANEL (CC13)
ALK PHOS: 97 U/L (ref 40–150)
ALT: 9 U/L (ref 0–55)
AST: 8 U/L (ref 5–34)
Albumin: 3.4 g/dL — ABNORMAL LOW (ref 3.5–5.0)
Anion Gap: 14 mEq/L — ABNORMAL HIGH (ref 3–11)
BILIRUBIN TOTAL: 0.21 mg/dL (ref 0.20–1.20)
BUN: 12.7 mg/dL (ref 7.0–26.0)
CO2: 20 mEq/L — ABNORMAL LOW (ref 22–29)
Calcium: 10.2 mg/dL (ref 8.4–10.4)
Chloride: 106 mEq/L (ref 98–109)
Creatinine: 0.8 mg/dL (ref 0.6–1.1)
EGFR: 79 mL/min/{1.73_m2} — AB (ref >90)
GLUCOSE: 138 mg/dL (ref 70–140)
Potassium: 4.3 mEq/L (ref 3.5–5.1)
SODIUM: 140 meq/L (ref 136–145)
TOTAL PROTEIN: 6.8 g/dL (ref 6.4–8.3)

## 2014-07-13 LAB — MAGNESIUM (CC13): MAGNESIUM: 2.1 mg/dL (ref 1.5–2.5)

## 2014-07-13 MED ORDER — PALONOSETRON HCL INJECTION 0.25 MG/5ML
INTRAVENOUS | Status: AC
  Filled 2014-07-13: qty 5

## 2014-07-13 MED ORDER — DEXAMETHASONE SODIUM PHOSPHATE 20 MG/5ML IJ SOLN
INTRAMUSCULAR | Status: AC
  Filled 2014-07-13: qty 5

## 2014-07-13 MED ORDER — PACLITAXEL CHEMO INJECTION 300 MG/50ML
80.0000 mg/m2 | Freq: Once | INTRAVENOUS | Status: AC
  Administered 2014-07-13: 126 mg via INTRAVENOUS
  Filled 2014-07-13: qty 21

## 2014-07-13 MED ORDER — FUROSEMIDE 10 MG/ML IJ SOLN: 20.0000 mg | Freq: Once | INTRAMUSCULAR | Status: DC

## 2014-07-13 MED ORDER — PALONOSETRON HCL INJECTION 0.25 MG/5ML
0.2500 mg | Freq: Once | INTRAVENOUS | Status: AC
  Administered 2014-07-13: 0.25 mg via INTRAVENOUS

## 2014-07-13 MED ORDER — SODIUM CHLORIDE 0.9 % IV SOLN
Freq: Once | INTRAVENOUS | Status: AC
  Administered 2014-07-13: 10:00:00 via INTRAVENOUS

## 2014-07-13 MED ORDER — SODIUM CHLORIDE 0.9 % IV SOLN
150.0000 mg | Freq: Once | INTRAVENOUS | Status: AC
  Administered 2014-07-13: 150 mg via INTRAVENOUS
  Filled 2014-07-13: qty 5

## 2014-07-13 MED ORDER — ACETAMINOPHEN 325 MG PO TABS
ORAL_TABLET | ORAL | Status: AC
  Filled 2014-07-13: qty 2

## 2014-07-13 MED ORDER — CISPLATIN CHEMO INJECTION 100MG/100ML
40.0000 mg/m2 | Freq: Once | INTRAVENOUS | Status: AC
  Administered 2014-07-13: 63 mg via INTRAVENOUS
  Filled 2014-07-13: qty 63

## 2014-07-13 MED ORDER — DEXAMETHASONE SODIUM PHOSPHATE 20 MG/5ML IJ SOLN
12.0000 mg | Freq: Once | INTRAMUSCULAR | Status: AC
  Administered 2014-07-13: 12 mg via INTRAVENOUS

## 2014-07-13 MED ORDER — DIPHENHYDRAMINE HCL 50 MG/ML IJ SOLN
50.0000 mg | Freq: Once | INTRAMUSCULAR | Status: AC
  Administered 2014-07-13: 50 mg via INTRAVENOUS

## 2014-07-13 MED ORDER — HEPARIN SOD (PORK) LOCK FLUSH 100 UNIT/ML IV SOLN
500.0000 [IU] | Freq: Once | INTRAVENOUS | Status: AC | PRN
  Administered 2014-07-13: 500 [IU]
  Filled 2014-07-13: qty 5

## 2014-07-13 MED ORDER — DIPHENHYDRAMINE HCL 50 MG/ML IJ SOLN
INTRAMUSCULAR | Status: AC
Start: 2014-07-13 — End: 2014-07-13
  Filled 2014-07-13: qty 1

## 2014-07-13 MED ORDER — SODIUM CHLORIDE 0.9 % IJ SOLN
10.0000 mL | INTRAMUSCULAR | Status: DC | PRN
  Administered 2014-07-13: 10 mL
  Filled 2014-07-13: qty 10

## 2014-07-13 MED ORDER — FAMOTIDINE IN NACL 20-0.9 MG/50ML-% IV SOLN
INTRAVENOUS | Status: AC
  Filled 2014-07-13: qty 50

## 2014-07-13 MED ORDER — POTASSIUM CHLORIDE 2 MEQ/ML IV SOLN
Freq: Once | INTRAVENOUS | Status: AC
  Administered 2014-07-13: 10:00:00 via INTRAVENOUS
  Filled 2014-07-13: qty 10

## 2014-07-13 MED ORDER — FAMOTIDINE IN NACL 20-0.9 MG/50ML-% IV SOLN
20.0000 mg | Freq: Once | INTRAVENOUS | Status: AC
  Administered 2014-07-13: 20 mg via INTRAVENOUS

## 2014-07-13 MED ORDER — ACETAMINOPHEN 325 MG PO TABS
650.0000 mg | ORAL_TABLET | Freq: Once | ORAL | Status: AC
Start: 2014-07-13 — End: 2014-07-13
  Administered 2014-07-13: 650 mg via ORAL

## 2014-07-13 NOTE — Patient Instructions (Signed)
[place] Discharge Instructions for Patients Receiving Chemotherapy  Today you received the following chemotherapy agents Cisplatin/Paclitaxel.   To help prevent nausea and vomiting after your treatment, we encourage you to take your nausea medication as directed.    If you develop nausea and vomiting that is not controlled by your nausea medication, call the clinic.   BELOW ARE SYMPTOMS THAT SHOULD BE REPORTED IMMEDIATELY:  *FEVER GREATER THAN 100.5 F  *CHILLS WITH OR WITHOUT FEVER  NAUSEA AND VOMITING THAT IS NOT CONTROLLED WITH YOUR NAUSEA MEDICATION  *UNUSUAL SHORTNESS OF BREATH  *UNUSUAL BRUISING OR BLEEDING  TENDERNESS IN MOUTH AND THROAT WITH OR WITHOUT PRESENCE OF ULCERS  *URINARY PROBLEMS  *BOWEL PROBLEMS  UNUSUAL RASH Items with * indicate a potential emergency and should be followed up as soon as possible.  Feel free to call the clinic you have any questions or concerns. The clinic phone number is [phone].

## 2014-07-13 NOTE — Progress Notes (Signed)
Ok to proceed with chemotherapy without waiting for Magnesium per Dr. Marko Plume

## 2014-07-17 ENCOUNTER — Other Ambulatory Visit: Payer: Self-pay

## 2014-07-17 DIAGNOSIS — C519 Malignant neoplasm of vulva, unspecified: Secondary | ICD-10-CM

## 2014-07-20 ENCOUNTER — Ambulatory Visit (HOSPITAL_BASED_OUTPATIENT_CLINIC_OR_DEPARTMENT_OTHER): Payer: 59

## 2014-07-20 ENCOUNTER — Other Ambulatory Visit (HOSPITAL_BASED_OUTPATIENT_CLINIC_OR_DEPARTMENT_OTHER): Payer: 59

## 2014-07-20 DIAGNOSIS — C3432 Malignant neoplasm of lower lobe, left bronchus or lung: Secondary | ICD-10-CM

## 2014-07-20 DIAGNOSIS — C792 Secondary malignant neoplasm of skin: Secondary | ICD-10-CM

## 2014-07-20 DIAGNOSIS — C778 Secondary and unspecified malignant neoplasm of lymph nodes of multiple regions: Secondary | ICD-10-CM

## 2014-07-20 DIAGNOSIS — Z5111 Encounter for antineoplastic chemotherapy: Secondary | ICD-10-CM | POA: Diagnosis not present

## 2014-07-20 DIAGNOSIS — C519 Malignant neoplasm of vulva, unspecified: Secondary | ICD-10-CM | POA: Diagnosis not present

## 2014-07-20 LAB — COMPREHENSIVE METABOLIC PANEL (CC13)
ALT: 7 U/L (ref 0–55)
AST: 7 U/L (ref 5–34)
Albumin: 3.2 g/dL — ABNORMAL LOW (ref 3.5–5.0)
Alkaline Phosphatase: 92 U/L (ref 40–150)
Anion Gap: 15 mEq/L — ABNORMAL HIGH (ref 3–11)
BUN: 16 mg/dL (ref 7.0–26.0)
CHLORIDE: 100 meq/L (ref 98–109)
CO2: 22 mEq/L (ref 22–29)
CREATININE: 1 mg/dL (ref 0.6–1.1)
Calcium: 9.8 mg/dL (ref 8.4–10.4)
EGFR: 62 mL/min/{1.73_m2} — ABNORMAL LOW (ref >90)
Glucose: 181 mg/dl — ABNORMAL HIGH (ref 70–140)
Potassium: 4.1 mEq/L (ref 3.5–5.1)
SODIUM: 136 meq/L (ref 136–145)
TOTAL PROTEIN: 6.7 g/dL (ref 6.4–8.3)
Total Bilirubin: 0.25 mg/dL (ref 0.20–1.20)

## 2014-07-20 LAB — CBC WITH DIFFERENTIAL/PLATELET
BASO%: 0.3 % (ref 0.0–2.0)
Basophils Absolute: 0 10*3/uL (ref 0.0–0.1)
EOS%: 0.1 % (ref 0.0–7.0)
Eosinophils Absolute: 0 10*3/uL (ref 0.0–0.5)
HEMATOCRIT: 33.9 % — AB (ref 34.8–46.6)
HGB: 11.1 g/dL — ABNORMAL LOW (ref 11.6–15.9)
LYMPH%: 14.7 % (ref 14.0–49.7)
MCH: 31.6 pg (ref 25.1–34.0)
MCHC: 32.8 g/dL (ref 31.5–36.0)
MCV: 96.3 fL (ref 79.5–101.0)
MONO#: 0.2 10*3/uL (ref 0.1–0.9)
MONO%: 5.4 % (ref 0.0–14.0)
NEUT%: 79.5 % — ABNORMAL HIGH (ref 38.4–76.8)
NEUTROS ABS: 3.5 10*3/uL (ref 1.5–6.5)
Platelets: 204 10*3/uL (ref 145–400)
RBC: 3.52 10*6/uL — AB (ref 3.70–5.45)
RDW: 14.9 % — ABNORMAL HIGH (ref 11.2–14.5)
WBC: 4.4 10*3/uL (ref 3.9–10.3)
lymph#: 0.6 10*3/uL — ABNORMAL LOW (ref 0.9–3.3)

## 2014-07-20 LAB — MAGNESIUM (CC13): MAGNESIUM: 2.1 mg/dL (ref 1.5–2.5)

## 2014-07-20 MED ORDER — DIPHENHYDRAMINE HCL 50 MG/ML IJ SOLN
50.0000 mg | Freq: Once | INTRAMUSCULAR | Status: AC
  Administered 2014-07-20: 50 mg via INTRAVENOUS

## 2014-07-20 MED ORDER — SODIUM CHLORIDE 0.9 % IV SOLN
Freq: Once | INTRAVENOUS | Status: AC
  Administered 2014-07-20: 10:00:00 via INTRAVENOUS

## 2014-07-20 MED ORDER — HEPARIN SOD (PORK) LOCK FLUSH 100 UNIT/ML IV SOLN
500.0000 [IU] | Freq: Once | INTRAVENOUS | Status: AC | PRN
  Administered 2014-07-20: 500 [IU]
  Filled 2014-07-20: qty 5

## 2014-07-20 MED ORDER — POTASSIUM CHLORIDE 2 MEQ/ML IV SOLN
Freq: Once | INTRAVENOUS | Status: AC
  Administered 2014-07-20: 10:00:00 via INTRAVENOUS
  Filled 2014-07-20: qty 10

## 2014-07-20 MED ORDER — SODIUM CHLORIDE 0.9 % IJ SOLN
10.0000 mL | INTRAMUSCULAR | Status: DC | PRN
Start: 2014-07-20 — End: 2014-07-20
  Administered 2014-07-20: 10 mL
  Filled 2014-07-20: qty 10

## 2014-07-20 MED ORDER — DIPHENHYDRAMINE HCL 50 MG/ML IJ SOLN
INTRAMUSCULAR | Status: AC
  Filled 2014-07-20: qty 1

## 2014-07-20 MED ORDER — SODIUM CHLORIDE 0.9 % IV SOLN
40.0000 mg/m2 | Freq: Once | INTRAVENOUS | Status: AC
  Administered 2014-07-20: 63 mg via INTRAVENOUS
  Filled 2014-07-20: qty 63

## 2014-07-20 MED ORDER — SODIUM CHLORIDE 0.9 % IV SOLN
Freq: Once | INTRAVENOUS | Status: AC
  Administered 2014-07-20: 11:00:00 via INTRAVENOUS
  Filled 2014-07-20: qty 5

## 2014-07-20 MED ORDER — ACETAMINOPHEN 325 MG PO TABS
ORAL_TABLET | ORAL | Status: AC
  Filled 2014-07-20: qty 2

## 2014-07-20 MED ORDER — FOSAPREPITANT DIMEGLUMINE INJECTION 150 MG: 150.0000 mg | Freq: Once | INTRAVENOUS | Status: DC

## 2014-07-20 MED ORDER — PALONOSETRON HCL INJECTION 0.25 MG/5ML
0.2500 mg | Freq: Once | INTRAVENOUS | Status: AC
  Administered 2014-07-20: 0.25 mg via INTRAVENOUS

## 2014-07-20 MED ORDER — FAMOTIDINE IN NACL 20-0.9 MG/50ML-% IV SOLN
INTRAVENOUS | Status: AC
  Filled 2014-07-20: qty 50

## 2014-07-20 MED ORDER — PALONOSETRON HCL INJECTION 0.25 MG/5ML
INTRAVENOUS | Status: AC
  Filled 2014-07-20: qty 5

## 2014-07-20 MED ORDER — PACLITAXEL CHEMO INJECTION 300 MG/50ML
80.0000 mg/m2 | Freq: Once | INTRAVENOUS | Status: AC
  Administered 2014-07-20: 126 mg via INTRAVENOUS
  Filled 2014-07-20: qty 21

## 2014-07-20 MED ORDER — ACETAMINOPHEN 325 MG PO TABS
650.0000 mg | ORAL_TABLET | Freq: Once | ORAL | Status: AC
  Administered 2014-07-20: 650 mg via ORAL

## 2014-07-20 MED ORDER — FAMOTIDINE IN NACL 20-0.9 MG/50ML-% IV SOLN
20.0000 mg | Freq: Once | INTRAVENOUS | Status: AC
  Administered 2014-07-20: 20 mg via INTRAVENOUS

## 2014-07-20 MED ORDER — PACLITAXEL CHEMO INJECTION 300 MG/50ML: 80.0000 mg/m2 | Freq: Once | INTRAVENOUS | Status: DC

## 2014-07-20 MED ORDER — DEXAMETHASONE SODIUM PHOSPHATE 20 MG/5ML IJ SOLN: 12.0000 mg | Freq: Once | INTRAMUSCULAR | Status: DC

## 2014-07-20 NOTE — Patient Instructions (Signed)
[place] Discharge Instructions for Patients Receiving Chemotherapy  Today you received the following chemotherapy agents Cisplatin/Paclitaxel.   To help prevent nausea and vomiting after your treatment, we encourage you to take your nausea medication as directed.    If you develop nausea and vomiting that is not controlled by your nausea medication, call the clinic.   BELOW ARE SYMPTOMS THAT SHOULD BE REPORTED IMMEDIATELY:  *FEVER GREATER THAN 100.5 F  *CHILLS WITH OR WITHOUT FEVER  NAUSEA AND VOMITING THAT IS NOT CONTROLLED WITH YOUR NAUSEA MEDICATION  *UNUSUAL SHORTNESS OF BREATH  *UNUSUAL BRUISING OR BLEEDING  TENDERNESS IN MOUTH AND THROAT WITH OR WITHOUT PRESENCE OF ULCERS  *URINARY PROBLEMS  *BOWEL PROBLEMS  UNUSUAL RASH Items with * indicate a potential emergency and should be followed up as soon as possible.  Feel free to call the clinic you have any questions or concerns. The clinic phone number is [phone].

## 2014-07-21 ENCOUNTER — Other Ambulatory Visit: Payer: Self-pay | Admitting: Oncology

## 2014-07-21 DIAGNOSIS — C519 Malignant neoplasm of vulva, unspecified: Secondary | ICD-10-CM

## 2014-07-21 DIAGNOSIS — C799 Secondary malignant neoplasm of unspecified site: Secondary | ICD-10-CM

## 2014-07-21 DIAGNOSIS — IMO0002 Reserved for concepts with insufficient information to code with codable children: Secondary | ICD-10-CM

## 2014-07-21 DIAGNOSIS — C3432 Malignant neoplasm of lower lobe, left bronchus or lung: Secondary | ICD-10-CM

## 2014-07-22 ENCOUNTER — Telehealth: Payer: Self-pay | Admitting: *Deleted

## 2014-07-22 NOTE — Telephone Encounter (Signed)
Patient called wanting to verify if it was ok to continue on her doxycycline that she received from the dermatologist. RN routed to [facility] and nurse.

## 2014-07-23 ENCOUNTER — Telehealth: Payer: Self-pay | Admitting: *Deleted

## 2014-07-23 NOTE — Telephone Encounter (Signed)
Per Dr. Marko Plume, it is fine for patient to take doxycycline. Called patient back and let her know this.

## 2014-07-23 NOTE — Telephone Encounter (Signed)
Patient called stating that she started on doxycycline from her dermatologist, but wants to make sure that its ok to take on active chemotherapy. RN notified MD Marko Plume. MD states that its ok. RN notified patient. Patient verbalized understanding.

## 2014-07-27 ENCOUNTER — Other Ambulatory Visit (HOSPITAL_BASED_OUTPATIENT_CLINIC_OR_DEPARTMENT_OTHER): Payer: 59

## 2014-07-27 ENCOUNTER — Ambulatory Visit (HOSPITAL_BASED_OUTPATIENT_CLINIC_OR_DEPARTMENT_OTHER): Payer: 59 | Admitting: Oncology

## 2014-07-27 ENCOUNTER — Telehealth: Payer: Self-pay | Admitting: Oncology

## 2014-07-27 ENCOUNTER — Ambulatory Visit (HOSPITAL_BASED_OUTPATIENT_CLINIC_OR_DEPARTMENT_OTHER): Payer: 59

## 2014-07-27 ENCOUNTER — Encounter: Payer: Self-pay | Admitting: Oncology

## 2014-07-27 VITALS — BP 133/73 | HR 93 | Temp 98.1°F | Resp 18 | Ht 61.0 in | Wt 126.6 lb

## 2014-07-27 DIAGNOSIS — C799 Secondary malignant neoplasm of unspecified site: Secondary | ICD-10-CM

## 2014-07-27 DIAGNOSIS — G620 Drug-induced polyneuropathy: Secondary | ICD-10-CM

## 2014-07-27 DIAGNOSIS — D6481 Anemia due to antineoplastic chemotherapy: Secondary | ICD-10-CM

## 2014-07-27 DIAGNOSIS — T451X5A Adverse effect of antineoplastic and immunosuppressive drugs, initial encounter: Secondary | ICD-10-CM

## 2014-07-27 DIAGNOSIS — Z5111 Encounter for antineoplastic chemotherapy: Secondary | ICD-10-CM

## 2014-07-27 DIAGNOSIS — C3492 Malignant neoplasm of unspecified part of left bronchus or lung: Secondary | ICD-10-CM

## 2014-07-27 DIAGNOSIS — G622 Polyneuropathy due to other toxic agents: Secondary | ICD-10-CM

## 2014-07-27 DIAGNOSIS — IMO0002 Reserved for concepts with insufficient information to code with codable children: Secondary | ICD-10-CM

## 2014-07-27 DIAGNOSIS — C519 Malignant neoplasm of vulva, unspecified: Secondary | ICD-10-CM

## 2014-07-27 DIAGNOSIS — Z95828 Presence of other vascular implants and grafts: Secondary | ICD-10-CM

## 2014-07-27 DIAGNOSIS — L259 Unspecified contact dermatitis, unspecified cause: Secondary | ICD-10-CM

## 2014-07-27 DIAGNOSIS — C3432 Malignant neoplasm of lower lobe, left bronchus or lung: Secondary | ICD-10-CM

## 2014-07-27 LAB — CBC WITH DIFFERENTIAL/PLATELET
BASO%: 0.6 % (ref 0.0–2.0)
BASOS ABS: 0 10*3/uL (ref 0.0–0.1)
EOS%: 0 % (ref 0.0–7.0)
Eosinophils Absolute: 0 10*3/uL (ref 0.0–0.5)
HCT: 33.5 % — ABNORMAL LOW (ref 34.8–46.6)
HGB: 10.6 g/dL — ABNORMAL LOW (ref 11.6–15.9)
LYMPH%: 15.4 % (ref 14.0–49.7)
MCH: 31 pg (ref 25.1–34.0)
MCHC: 31.7 g/dL (ref 31.5–36.0)
MCV: 97.5 fL (ref 79.5–101.0)
MONO#: 0.1 10*3/uL (ref 0.1–0.9)
MONO%: 2.7 % (ref 0.0–14.0)
NEUT#: 3.4 10*3/uL (ref 1.5–6.5)
NEUT%: 81.3 % — ABNORMAL HIGH (ref 38.4–76.8)
PLATELETS: 171 10*3/uL (ref 145–400)
RBC: 3.43 10*6/uL — ABNORMAL LOW (ref 3.70–5.45)
RDW: 15.1 % — AB (ref 11.2–14.5)
WBC: 4.1 10*3/uL (ref 3.9–10.3)
lymph#: 0.6 10*3/uL — ABNORMAL LOW (ref 0.9–3.3)

## 2014-07-27 LAB — COMPREHENSIVE METABOLIC PANEL (CC13)
ALBUMIN: 3.3 g/dL — AB (ref 3.5–5.0)
ALT: 12 U/L (ref 0–55)
AST: 9 U/L (ref 5–34)
Alkaline Phosphatase: 96 U/L (ref 40–150)
Anion Gap: 14 mEq/L — ABNORMAL HIGH (ref 3–11)
BUN: 21.3 mg/dL (ref 7.0–26.0)
CHLORIDE: 104 meq/L (ref 98–109)
CO2: 19 meq/L — AB (ref 22–29)
CREATININE: 0.9 mg/dL (ref 0.6–1.1)
Calcium: 9.7 mg/dL (ref 8.4–10.4)
EGFR: 72 mL/min/{1.73_m2} — AB (ref >90)
Glucose: 152 mg/dl — ABNORMAL HIGH (ref 70–140)
POTASSIUM: 4.1 meq/L (ref 3.5–5.1)
Sodium: 138 mEq/L (ref 136–145)
Total Protein: 6.5 g/dL (ref 6.4–8.3)

## 2014-07-27 LAB — MAGNESIUM (CC13): Magnesium: 1.5 mg/dl (ref 1.5–2.5)

## 2014-07-27 MED ORDER — SODIUM CHLORIDE 0.9 % IV SOLN
Freq: Once | INTRAVENOUS | Status: AC
  Administered 2014-07-27: 13:00:00 via INTRAVENOUS
  Filled 2014-07-27: qty 5

## 2014-07-27 MED ORDER — POTASSIUM CHLORIDE 2 MEQ/ML IV SOLN
Freq: Once | INTRAVENOUS | Status: AC
  Administered 2014-07-27: 11:00:00 via INTRAVENOUS
  Filled 2014-07-27: qty 10

## 2014-07-27 MED ORDER — PALONOSETRON HCL INJECTION 0.25 MG/5ML
INTRAVENOUS | Status: AC
  Filled 2014-07-27: qty 5

## 2014-07-27 MED ORDER — SODIUM CHLORIDE 0.9 % IJ SOLN
10.0000 mL | INTRAMUSCULAR | Status: DC | PRN
  Filled 2014-07-27: qty 10

## 2014-07-27 MED ORDER — PALONOSETRON HCL INJECTION 0.25 MG/5ML
0.2500 mg | Freq: Once | INTRAVENOUS | Status: AC
  Administered 2014-07-27: 0.25 mg via INTRAVENOUS

## 2014-07-27 MED ORDER — ACETAMINOPHEN 325 MG PO TABS
ORAL_TABLET | ORAL | Status: AC
  Filled 2014-07-27: qty 2

## 2014-07-27 MED ORDER — SODIUM CHLORIDE 0.9 % IV SOLN
40.0000 mg/m2 | Freq: Once | INTRAVENOUS | Status: AC
  Administered 2014-07-27: 63 mg via INTRAVENOUS
  Filled 2014-07-27: qty 63

## 2014-07-27 MED ORDER — SODIUM CHLORIDE 0.9 % IV SOLN
Freq: Once | INTRAVENOUS | Status: AC
  Administered 2014-07-27: 10:00:00 via INTRAVENOUS

## 2014-07-27 MED ORDER — ACETAMINOPHEN 325 MG PO TABS
650.0000 mg | ORAL_TABLET | Freq: Once | ORAL | Status: AC
  Administered 2014-07-27: 650 mg via ORAL

## 2014-07-27 MED ORDER — HEPARIN SOD (PORK) LOCK FLUSH 100 UNIT/ML IV SOLN
500.0000 [IU] | Freq: Once | INTRAVENOUS | Status: DC | PRN
  Filled 2014-07-27: qty 5

## 2014-07-27 NOTE — Progress Notes (Signed)
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 She had sensitizing CDDP with vulvar radiation completed early Dec 2015, and has had five cycles of weekly CDDP taxol thru 07-20-14.  Course has been complicated by severe flares of dermatitis, most recently last week.She was seen back at Dr Juel Burrow office, with additional topicals which have been helpful, and on doxycycline for 2 weeks.  She is to see Dr Isidore Moos on 4-8 and Dr Denman George on 4-18.  Patient has had intermittent numbness in lower legs bilaterally moreso than in feet, which she notices more when sitting with legs down. She has had some cramping in right calf, not clearly with exercise. She denies new or different pain. She notices mild SOB with exertion, but has been going about regular activities and no SOB at rest. She denies nausea, tho taste is not correct. Bowels are moving regularly. She has no significant discomfort now in vulvar area or perirectal. She has no bleeding.   PAC in Flu vaccine done.  ONCOLOGIC HISTORY Patient presented with gradually enlarging mass at right vulva for ~ 8 months, uncomfortable with direct pressure and ulcerated with occasional bleeding. She was seen in ED on 02-02-14 with right inguinal adenopathy and right vulvar masses, then seen by Dr Elonda Husky on 02-03-14, clinically with vulvar carcinoma. She was then seen in consultation by Dr Skeet Latch on 02-05-14, with biopsy of both vulvar mass and satellite lesions on inner thigh and mons. Her exam found 7 cm right vulvar mass with 4 cm extension to distal vagina, extending anteriorly to  urethral meatus and in ischiorectal area, with satellite lesions inner thigh and right mons. Pathology [id number]) showed invasive squamous cell carcinoma arising in background of high grade squamous intraepithelial lesion from vulva biopsy and invasive moderately differentiated squamous cell carcinoma from inner thigh lesion. CXR 02-06-14 showed left lower lobe lung mass 3.6 x 3.6 x 3.6 cm, with no adenopathy and no bony lesions, emphysematous changes. CT chest 02-09-14 showed the lower lobe mass 3.7 x 3.5 x 3.3 cm as well as 3 mm RLL nodule and faint 4 mm LUL nodule, no mediastinal or hilar nodes, no axillary or supraclavicular nodes, and upper abdomen not remarkable. PET 02-18-14 measured LLL lung mass 4.6 x 3.9 cm, hypermetabolic, and noted multiple subcentimeter nodules scattered thruout lungs bilaterally; the right vulvar mass was also hypermetabolic, extending up into right side of lower vagina, extensive R>L and right external iliac adenopathy, no uptake in skeleton. 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 After initial consideration of bilateral inguinal lymphadenectomy, plan subsequently changed to radiation with sensitizing CDDP. She had 3 stereotactic body radiation treatments to the LLL pulmonary mass 54 Gy in 3 fractions 10-16, 10-19 and 03-04-14. She received IMRT to vulvar area from 03-09-14, completed 04-23-14, with weekly CDDP as radiation sensitizer, 7 cycles of CDDP 03-09-14 thru 04-20-14. Radiation to vulva, vaginal, and pelvic/inguinal nodes was 60 Gy in 30 fractions to gross disease, 54 Gy in 30 fractions to high risk areas, 50.4Gy in 30 fractions to intermediate risk areas. She had vulvar, vaginal, and nodal boost of 6 Gy in  3 fractions to gross disease. Follow up CT chest 05-04-14 had cavitary, spiculated LLL mass 3.3 x 2.3 cm, having been 4.6 x 3.9 cm prior to IMRT, and no change in scattered small bilateral pulmonary  nodules, however PET 05-27-14 had progression in bilateral pulmonary nodules and partial response in local vulvar involvement. She began CDDP taxol on 06-22-14, with #6 given 07-27-14.  Review of systems as above, also: No fever or symptoms of infection. PAC working well. Appetite good. No swelling LE. Remainder of 10 point Review of Systems negative.  Objective:  Vital signs in last 24 hours:  BP 133/73 mmHg  Pulse 93  Temp(Src) 98.1 F (36.7 C) (Oral)  Resp 18  Ht 5' 1"  (1.549 m)  Wt 126 lb 9.6 oz (57.425 kg)  BMI 23.93 kg/m2  SpO2 99% Weight stable Alert, oriented and appropriate. Ambulatory without difficulty.  Hair thin but no complete alopecia  HEENT:PERRL, sclerae not icteric. Oral mucosa moist without lesions, posterior pharynx clear.  Neck supple. No JVD.  Lymphatics:no cervical,supraclavicular adenopathy Resp: clear to auscultation bilaterally and normal percussion bilaterally Cardio: regular rate and rhythm. No gallop. GI: soft, nontender, not distended, no mass or organomegaly. Normally active bowel sounds.  Vulvar area no open ulcerations, skin intact, not tender Musculoskeletal/ Extremities: without pitting edema, cords, tenderness Neuro: sensation intact LE now, otherwise nonfocal. PSYCH appropriate mood and affect Skin UE and trunk/ face with minimal scattered dull erythematous residua of dermatitis, skin less dry. Inner thighs to knees confluent blanching dull erythema, improved per patient, no desquamation. Portacath-without erythema or tenderness  Lab Results:  Results for orders placed or performed in visit on 07/27/14  CBC with Differential  Result Value Ref Range   WBC 4.1 3.9 - 10.3 10e3/uL   NEUT# 3.4 1.5 - 6.5 10e3/uL   HGB 10.6 (L) 11.6 - 15.9 g/dL   HCT 33.5 (L) 34.8 - 46.6 %   Platelets 171 145 - 400 10e3/uL   MCV 97.5 79.5 - 101.0 fL   MCH 31.0 25.1 - 34.0 pg   MCHC 31.7 31.5 - 36.0 g/dL   RBC 3.43 (L) 3.70 - 5.45 10e6/uL   RDW 15.1 (H) 11.2 -  14.5 %   lymph# 0.6 (L) 0.9 - 3.3 10e3/uL   MONO# 0.1 0.1 - 0.9 10e3/uL   Eosinophils Absolute 0.0 0.0 - 0.5 10e3/uL   Basophils Absolute 0.0 0.0 - 0.1 10e3/uL   NEUT% 81.3 (H) 38.4 - 76.8 %   LYMPH% 15.4 14.0 - 49.7 %   MONO% 2.7 0.0 - 14.0 %   EOS% 0.0 0.0 - 7.0 %   BASO% 0.6 0.0 - 2.0 %  Comprehensive metabolic panel (Cmet) - [facility]  Result Value Ref Range   Sodium 138 136 - 145 mEq/L   Potassium 4.1 3.5 - 5.1 mEq/L   Chloride 104 98 - 109 mEq/L   CO2 19 (L) 22 - 29 mEq/L   Glucose 152 (H) 70 - 140 mg/dl   BUN 21.3 7.0 - 26.0 mg/dL   Creatinine 0.9 0.6 - 1.1 mg/dL   Total Bilirubin <0.20 0.20 - 1.20 mg/dL   Alkaline Phosphatase 96 40 - 150 U/L   AST 9 5 - 34 U/L   ALT 12 0 - 55 U/L   Total Protein 6.5 6.4 - 8.3 g/dL   Albumin 3.3 (L) 3.5 - 5.0 g/dL   Calcium 9.7 8.4 - 10.4 mg/dL   Anion Gap 14 (H) 3 - 11 mEq/L   EGFR 72 (L) >90 ml/min/1.73  m2  Magnesium  Result Value Ref Range   Magnesium 1.5 1.5 - 2.5 mg/dl     Studies/Results:  No results found.  Medications: I have reviewed the patient's current medications. Will complete doxycycline per dermatology  DISCUSSION: Taxol held today with unusual peripheral neuropathy symptoms; CDDP given. Will get CT CAP prior to any further treatment.  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 Improved local disease with RT/ sensitizing CDDP completed 04-23-14. Repeat CTs/ follow up with Drs Denman George and Isidore Moos upcoming. 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 Bilateral subcentimeter pulmonary nodules increased by PET in Jan as compared with CT chest in Dec. She has now had 6 weekly cycles CDDP taxol 2--8-16 thru 07-27-14 (CDDP only on 07-27-14).  Restaging scans prior to my next visit. 3.severe eczema flare, better with interventions by dermatology 4.intermittent neuropathy symptoms lower legs: not typical for chemotherapy, but held taxol today  5.PAC in 6.history migraine HAs. No HA with zofran.  7.allergy PCN  8.BTL  9. 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 12.long past tobacco 13.transient speech difficulty with numbness of lip andleft hand early Dec. Neurology involved, no metastatic disease found. On ASA.  NOTE patient lives in [place] and may prefer transfer to [facility] location if ok with gyn onc and rad onc.  Chemo orders changed as above. Message to colleague re question of primary squamous cell lung.  Patient in agreement with plans.  Juna Caban P, MD   07/27/2014, 9:33 AM

## 2014-07-27 NOTE — Telephone Encounter (Signed)
appts made and avs pritned for pt,contrast given

## 2014-07-27 NOTE — Patient Instructions (Signed)
[place] Discharge Instructions for Patients Receiving Chemotherapy  Today you received the following chemotherapy agents Cisplatin To help prevent nausea and vomiting after your treatment, we encourage you to take your nausea medication as directed/prescribed    If you develop nausea and vomiting that is not controlled by your nausea medication, call the clinic.   BELOW ARE SYMPTOMS THAT SHOULD BE REPORTED IMMEDIATELY:  *FEVER GREATER THAN 100.5 F  *CHILLS WITH OR WITHOUT FEVER  NAUSEA AND VOMITING THAT IS NOT CONTROLLED WITH YOUR NAUSEA MEDICATION  *UNUSUAL SHORTNESS OF BREATH  *UNUSUAL BRUISING OR BLEEDING  TENDERNESS IN MOUTH AND THROAT WITH OR WITHOUT PRESENCE OF ULCERS  *URINARY PROBLEMS  *BOWEL PROBLEMS  UNUSUAL RASH Items with * indicate a potential emergency and should be followed up as soon as possible.  Feel free to call the clinic you have any questions or concerns. The clinic phone number is [phone].

## 2014-07-28 DIAGNOSIS — C3490 Malignant neoplasm of unspecified part of unspecified bronchus or lung: Secondary | ICD-10-CM | POA: Insufficient documentation

## 2014-07-28 DIAGNOSIS — T451X5A Adverse effect of antineoplastic and immunosuppressive drugs, initial encounter: Secondary | ICD-10-CM

## 2014-07-28 DIAGNOSIS — D6481 Anemia due to antineoplastic chemotherapy: Secondary | ICD-10-CM | POA: Insufficient documentation

## 2014-07-28 DIAGNOSIS — G620 Drug-induced polyneuropathy: Secondary | ICD-10-CM | POA: Insufficient documentation

## 2014-07-28 DIAGNOSIS — Z95828 Presence of other vascular implants and grafts: Secondary | ICD-10-CM | POA: Insufficient documentation

## 2014-08-10 ENCOUNTER — Ambulatory Visit (HOSPITAL_COMMUNITY)
Admission: RE | Admit: 2014-08-10 | Discharge: 2014-08-10 | Disposition: A | Discharge status: Home / Self Care | Payer: 59 | Source: Ambulatory Visit | Attending: Oncology | Admitting: Oncology

## 2014-08-10 ENCOUNTER — Encounter (HOSPITAL_COMMUNITY): Payer: Self-pay

## 2014-08-10 DIAGNOSIS — C519 Malignant neoplasm of vulva, unspecified: Secondary | ICD-10-CM | POA: Diagnosis not present

## 2014-08-10 DIAGNOSIS — C799 Secondary malignant neoplasm of unspecified site: Secondary | ICD-10-CM

## 2014-08-10 DIAGNOSIS — C792 Secondary malignant neoplasm of skin: Secondary | ICD-10-CM | POA: Diagnosis not present

## 2014-08-10 DIAGNOSIS — IMO0002 Reserved for concepts with insufficient information to code with codable children: Secondary | ICD-10-CM

## 2014-08-10 MED ORDER — IOHEXOL 300 MG/ML  SOLN
100.0000 mL | Freq: Once | INTRAMUSCULAR | Status: AC | PRN
  Administered 2014-08-10: 100 mL via INTRAVENOUS

## 2014-08-12 ENCOUNTER — Other Ambulatory Visit: Payer: Self-pay | Admitting: Oncology

## 2014-08-13 ENCOUNTER — Encounter: Payer: Self-pay | Admitting: *Deleted

## 2014-08-13 ENCOUNTER — Ambulatory Visit (HOSPITAL_BASED_OUTPATIENT_CLINIC_OR_DEPARTMENT_OTHER): Payer: 59 | Admitting: Oncology

## 2014-08-13 ENCOUNTER — Telehealth: Payer: Self-pay | Admitting: Oncology

## 2014-08-13 ENCOUNTER — Other Ambulatory Visit (HOSPITAL_BASED_OUTPATIENT_CLINIC_OR_DEPARTMENT_OTHER): Payer: 59

## 2014-08-13 VITALS — BP 127/77 | HR 85 | Temp 98.4°F | Resp 18 | Ht 61.0 in | Wt 124.9 lb

## 2014-08-13 DIAGNOSIS — Z95828 Presence of other vascular implants and grafts: Secondary | ICD-10-CM

## 2014-08-13 DIAGNOSIS — C3492 Malignant neoplasm of unspecified part of left bronchus or lung: Secondary | ICD-10-CM

## 2014-08-13 DIAGNOSIS — G629 Polyneuropathy, unspecified: Secondary | ICD-10-CM

## 2014-08-13 DIAGNOSIS — C519 Malignant neoplasm of vulva, unspecified: Secondary | ICD-10-CM

## 2014-08-13 DIAGNOSIS — L259 Unspecified contact dermatitis, unspecified cause: Secondary | ICD-10-CM

## 2014-08-13 DIAGNOSIS — C778 Secondary and unspecified malignant neoplasm of lymph nodes of multiple regions: Secondary | ICD-10-CM

## 2014-08-13 DIAGNOSIS — D6481 Anemia due to antineoplastic chemotherapy: Secondary | ICD-10-CM

## 2014-08-13 DIAGNOSIS — D6959 Other secondary thrombocytopenia: Secondary | ICD-10-CM

## 2014-08-13 DIAGNOSIS — C7982 Secondary malignant neoplasm of genital organs: Secondary | ICD-10-CM

## 2014-08-13 DIAGNOSIS — C799 Secondary malignant neoplasm of unspecified site: Secondary | ICD-10-CM

## 2014-08-13 DIAGNOSIS — C792 Secondary malignant neoplasm of skin: Secondary | ICD-10-CM

## 2014-08-13 DIAGNOSIS — L309 Dermatitis, unspecified: Secondary | ICD-10-CM

## 2014-08-13 DIAGNOSIS — C3432 Malignant neoplasm of lower lobe, left bronchus or lung: Secondary | ICD-10-CM

## 2014-08-13 DIAGNOSIS — T451X5A Adverse effect of antineoplastic and immunosuppressive drugs, initial encounter: Secondary | ICD-10-CM

## 2014-08-13 DIAGNOSIS — D696 Thrombocytopenia, unspecified: Secondary | ICD-10-CM

## 2014-08-13 DIAGNOSIS — IMO0002 Reserved for concepts with insufficient information to code with codable children: Secondary | ICD-10-CM

## 2014-08-13 LAB — COMPREHENSIVE METABOLIC PANEL (CC13)
ALK PHOS: 87 U/L (ref 40–150)
ALT: 9 U/L (ref 0–55)
AST: 11 U/L (ref 5–34)
Albumin: 3.4 g/dL — ABNORMAL LOW (ref 3.5–5.0)
Anion Gap: 13 mEq/L — ABNORMAL HIGH (ref 3–11)
BUN: 10.4 mg/dL (ref 7.0–26.0)
CALCIUM: 9.4 mg/dL (ref 8.4–10.4)
CO2: 24 mEq/L (ref 22–29)
Chloride: 103 mEq/L (ref 98–109)
Creatinine: 0.9 mg/dL (ref 0.6–1.1)
EGFR: 71 mL/min/{1.73_m2} — AB (ref >90)
Glucose: 103 mg/dl (ref 70–140)
POTASSIUM: 3.7 meq/L (ref 3.5–5.1)
SODIUM: 139 meq/L (ref 136–145)
TOTAL PROTEIN: 6.4 g/dL (ref 6.4–8.3)
Total Bilirubin: 0.31 mg/dL (ref 0.20–1.20)

## 2014-08-13 LAB — CBC WITH DIFFERENTIAL/PLATELET
BASO%: 0 % (ref 0.0–2.0)
Basophils Absolute: 0 10*3/uL (ref 0.0–0.1)
EOS ABS: 0.1 10*3/uL (ref 0.0–0.5)
EOS%: 2.7 % (ref 0.0–7.0)
HCT: 29.8 % — ABNORMAL LOW (ref 34.8–46.6)
HGB: 9.8 g/dL — ABNORMAL LOW (ref 11.6–15.9)
LYMPH%: 17.2 % (ref 14.0–49.7)
MCH: 32.1 pg (ref 25.1–34.0)
MCHC: 32.9 g/dL (ref 31.5–36.0)
MCV: 97.7 fL (ref 79.5–101.0)
MONO#: 0.4 10*3/uL (ref 0.1–0.9)
MONO%: 9.9 % (ref 0.0–14.0)
NEUT%: 70.2 % (ref 38.4–76.8)
NEUTROS ABS: 2.6 10*3/uL (ref 1.5–6.5)
PLATELETS: 100 10*3/uL — AB (ref 145–400)
RBC: 3.05 10*6/uL — AB (ref 3.70–5.45)
RDW: 17.5 % — AB (ref 11.2–14.5)
WBC: 3.7 10*3/uL — ABNORMAL LOW (ref 3.9–10.3)
lymph#: 0.6 10*3/uL — ABNORMAL LOW (ref 0.9–3.3)

## 2014-08-13 LAB — MAGNESIUM (CC13): Magnesium: 1.9 mg/dl (ref 1.5–2.5)

## 2014-08-13 NOTE — Progress Notes (Signed)
OFFICE PROGRESS NOTE   August 13, 2014   Physicians:Emma Rossi/ W.Skeet Latch ,[name], Alfredia Ferguson.[place] (PCP)  INTERVAL HISTORY:  Patient is seen, alone for visit, in follow up of restaging scans done 08-10-14 after additional chemotherapy for metastatic squamous cell vulvar carcinoma and what is either metastatic or primary squamous cell carcinoma of lung. Course has been complicated by flares of chronic dermatitis. Patient has been treated thus far with IMRT to dominant area of cancer in LLL lung, with radiation + sensitizing CDDP x 7 cycles thru 04-20-14, then with 5 cycles taxol + CDDP and one additional single agent CDDP thru 07-27-14.  She is to see Dr Isidore Moos on 08-21-14 and Dr Denman George on 08-31-14. CT CAP 08-10-14 compared with CT chest 05-04-14 and PET 05-27-14 shows multiple bilateral pulmonary nodules stable and a few larger by millimeters, with LLL lesion unchanged and no new nodules; AP has 2 mm lesions in liver unclear etiology thought likely cysts and no adenopathy.   Patient felt badly for 2 weeks after last chemo, with GERD, weakness, and difficulty swallowing (did not let us know), but has felt gradually better subsequently, with improved energy and appetite, no nausea, bowels moving regularly without constipation, and just occasional numbness in LLE now. She has some soreness "like pulled muscle" left upper arm and left lateral chest, possibly from recent activity.  The most recent topicals from dermatology are helpful, tho skin in areas of rash is still very dry and puritic, and she has local reactions to elastic as in underwear. 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 The Nivolumab unfortunately has 20-28% incidence of rash even in patients without preexisting dermatitis.   PAC in, flushed 08-10-14 with CT 08-10-14 Flu vaccine  done  ONCOLOGIC HISTORY tient presented with gradually enlarging mass at right vulva for ~ 8 months, uncomfortable with direct pressure and ulcerated with occasional bleeding. She was seen in ED on 02-02-14 with right inguinal adenopathy and right vulvar masses,  by Dr Elonda Husky on 02-03-14, then by Dr Skeet Latch on 02-05-14, with biopsy of both vulvar mass and satellite lesions on inner thigh and mons. Exam found 7 cm right vulvar mass with 4 cm extension to distal vagina, extending anteriorly to urethral meatus and in ischiorectal area, with satellite lesions inner thigh and right mons. Pathology [phone]) showed invasive squamous cell carcinoma arising in background of high grade squamous intraepithelial lesion from vulva biopsy and invasive moderately differentiated squamous cell carcinoma from inner thigh lesion. CXR 02-06-14 showed left lower lobe lung mass 3.6 x 3.6 x 3.6 cm, with no adenopathy and no bony lesions, emphysematous changes. CT chest 02-09-14 showed the lower lobe mass 3.7 x 3.5 x 3.3 cm as well as 3 mm RLL nodule and faint 4 mm LUL nodule, no mediastinal or hilar nodes, no axillary or supraclavicular nodes, and upper abdomen not remarkable. PET 02-18-14 measured LLL lung mass 4.6 x 3.9 cm, hypermetabolic, and noted multiple subcentimeter nodules scattered thruout lungs bilaterally; the right vulvar mass was also hypermetabolic, extending up into right side of lower vagina, extensive R>L and right external iliac adenopathy, no uptake in skeleton. 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 She had 3 stereotactic radiation treatments to the LLL pulmonary mass 54 Gy in 3 fractions 10-16, 10-19 and 03-04-14. She received IMRT  to vulvar area from 03-09-14, completed 04-23-14, with weekly CDDP x7  03-09-14 thru 04-20-14. Radiation to vulva, vaginal, and pelvic/inguinal nodes was 60 Gy in 30 fractions to gross disease, 54 Gy in 30 fractions to high  risk areas, 50.4Gy in 30 fractions to intermediate risk areas. She had vulvar, vaginal, and nodal boost of 6 Gy in 3 fractions to gross disease. Follow up CT chest 05-04-14 had cavitary, spiculated LLL mass 3.3 x 2.3 cm, having been 4.6 x 3.9 cm prior to IMRT, and no change in scattered small bilateral pulmonary nodules, however PET 05-27-14 had progression in bilateral pulmonary nodules and partial response in local vulvar involvement. She began CDDP taxol on 06-22-14, with #6 given 07-27-14. CT CAP 08-10-14 had majority of lung nodules stable with a few slightly increased, 2 mm hepatic lucencies possibly cysts, no adenopathy AP.     Review of systems as above, also: No fever or symptoms of infection. Fatigues with much exertion,tho she is trying to increase activity including some yard work and house work.  Proximal muscles noticeably weaker than when she was physically active at work. No SOB at rest, no cough or wheeze. She is having difficulty adjusting to not working. Remainder of 10 point Review of Systems negative.  Objective:  Vital signs in last 24 hours:  BP 127/77 mmHg  Pulse 85  Temp(Src) 98.4 F (36.9 C) (Oral)  Resp 18  Ht 5' 1"  (1.549 m)  Wt 124 lb 14.4 oz (56.654 kg)  BMI 23.61 kg/m2 Weight is down 2 lbs Alert, oriented and appropriate, very pleasant as always. Ambulatory without difficulty.  No alopecia  HEENT:PERRL, sclerae not icteric. Oral mucosa moist without lesions, posterior pharynx clear, no thrush. Neck supple. No JVD.  Lymphatics:no cervical,supraclavicular,  or inguinal adenopathy Resp: clear to auscultation bilaterally and normal percussion bilaterally Cardio: regular rate and rhythm. No gallop. GI: soft, nontender, not distended, no mass or organomegaly. Normally active bowel sounds. Musculoskeletal/ Extremities: without pitting edema, cords, tenderness. Decreased muscle mass thruout. No findings in area of tenderness left biceps. Neuro: no peripheral  neuropathy. Otherwise nonfocal. PSYCH does not appear obviously depressed Skin resolving erythema upper inner thighs bilaterally, axillae, scattered on UE. Otherwise without ecchymosis, petechiae Portacath-without erythema or tenderness  Lab Results:  Results for orders placed or performed in visit on 08/13/14  CBC with Differential  Result Value Ref Range   WBC 3.7 (L) 3.9 - 10.3 10e3/uL   NEUT# 2.6 1.5 - 6.5 10e3/uL   HGB 9.8 (L) 11.6 - 15.9 g/dL   HCT 29.8 (L) 34.8 - 46.6 %   Platelets 100 (L) 145 - 400 10e3/uL   MCV 97.7 79.5 - 101.0 fL   MCH 32.1 25.1 - 34.0 pg   MCHC 32.9 31.5 - 36.0 g/dL   RBC 3.05 (L) 3.70 - 5.45 10e6/uL   RDW 17.5 (H) 11.2 - 14.5 %   lymph# 0.6 (L) 0.9 - 3.3 10e3/uL   MONO# 0.4 0.1 - 0.9 10e3/uL   Eosinophils Absolute 0.1 0.0 - 0.5 10e3/uL   Basophils Absolute 0.0 0.0 - 0.1 10e3/uL   NEUT% 70.2 38.4 - 76.8 %   LYMPH% 17.2 14.0 - 49.7 %   MONO% 9.9 0.0 - 14.0 %   EOS% 2.7 0.0 - 7.0 %   BASO% 0.0 0.0 - 2.0 %  Comprehensive metabolic panel (Cmet) - [facility]  Result Value Ref Range   Sodium 139 136 - 145 mEq/L   Potassium 3.7 3.5 - 5.1 mEq/L   Chloride  103 98 - 109 mEq/L   CO2 24 22 - 29 mEq/L   Glucose 103 70 - 140 mg/dl   BUN 10.4 7.0 - 26.0 mg/dL   Creatinine 0.9 0.6 - 1.1 mg/dL   Total Bilirubin 0.31 0.20 - 1.20 mg/dL   Alkaline Phosphatase 87 40 - 150 U/L   AST 11 5 - 34 U/L   ALT 9 0 - 55 U/L   Total Protein 6.4 6.4 - 8.3 g/dL   Albumin 3.4 (L) 3.5 - 5.0 g/dL   Calcium 9.4 8.4 - 10.4 mg/dL   Anion Gap 13 (H) 3 - 11 mEq/L   EGFR 71 (L) >90 ml/min/1.73 m2  Magnesium  Result Value Ref Range   Magnesium 1.9 1.5 - 2.5 mg/dl    Hgb and plt a little lower today, likely from most recent chemo. Follow.   Studies/Results: EXAM: CT CHEST, ABDOMEN, AND PELVIS WITH CONTRAST  08-10-14  FINDINGS: CT CHEST FINDINGS  Mediastinum/Nodes: No axillary or supraclavicular lymphadenopathy. Port in the right anterior chest wall. No mediastinal  hilar lymphadenopathy. No pericardial fluid.  Lungs/Pleura: Multiple small bilateral pulmonary nodules are demonstrated. The majority of these nodules are not changed in size. Several nodules are slightly larger. 5 mm nodule in the right lung apex compares to 3 mm prior (image 13 series). 4 mm left lower lobe nodule on image 49 is more dense.  The cavitary thick-walled lesion in the left lower lobe is not changed measuring 32 x 21 mm compared to 32 x 21 mm. No new nodules.  CT ABDOMEN AND PELVIS FINDINGS  Hepatobiliary: Several tiny 2 mm lesions in left and right hepatic lobes. These foci are most consistent with ahepatic cysts although not clearly seen prior.  Pancreas: Pancreas is normal. No ductal dilatation. No pancreatic inflammation.  Spleen: Normal spleen  Adrenals/Urinary Tract: Adrenal glands are normal. Kidneys, ureters, bladder normal.  Stomach/Bowel: The stomach, small-bowel, cecum normal. The colon rectosigmoid are.  Vascular/Lymphatic: Abdominal aorta is normal caliber. There is no retroperitoneal or periportal lymphadenopathy. No pelvic lymphadenopathy.  Reproductive: Post hysterectomy anatomy. External genitalia is poorly evaluated no pelvic lymphadenopathy. There is some presacral perirectal fat haziness related to radiation.  Musculoskeletal: No aggressive osseous lesion  IMPRESSION: Chest Impression:  1. Majority of the bilateral pulmonary nodules are not changed in size. 2. Two 5 mm nodules do measure slightly greater.  Abdomen / Pelvis Impression:  1. Tiny hypodensities in are likely benign but not well demonstrated on comparison exams. 2. No metastatic adenopathy. 3. External genitalia is not well evaluated by CT   CT information above and PACs images reviewed with patient.   Medications: I have reviewed the patient's current medications.  DISCUSSION: I have discussed case with my partner in pulmonary oncology prior to  visit, particularly considering Nivolumab, which is approved for squamous cell lung ca with progression after initial platinum based doublet. Particular concern in this patient for Nivolumab would be skin rash, with is reported in up to 28% of patients, rarely with exfoliative dermatitis or erythema multiforme, however tumor responses can be significant. I have talked with her in general about Nivolumab today and have given her written information to review. Other treatment options would be additional chemotherapy, or could continue treatment break for 2-3 months and repeat scans. She understands that all treatment would be in attempt to control, improve, and slow progression of the metastatic cancer, but will not be curative. Just from our discussion today, she feels that she would be willing to  try Nivolumab if recommended. As there is nothing acutely requiring treatment on these scans, as she is progressively feeling better on this treatment break and as she has follow up visits with Drs Isidore Moos and Denman George in next couple of weeks, we have decided to continue on break until later in April. I have spoken with Dr Budd Palmer office and set up [place] 08-20-14 FOR HIS RECOMMENDATIONS RE DERMATITIS PRIOR TO POSSIBLE NIVOLUMAB. This note to be faxed to Dr Nevada Crane [phone]   I have again offered transfer of her care to our partner Dr Ancil Linsey at [place] center, however patient prefers to continue in [place], particularly as Drs Isidore Moos and Denman George are at this location.    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 Improved local disease with RT/ sensitizing CDDP completed 04-23-14, and has had additional taxol CDDP thru 07-27-14. Appointments with Drs Isidore Moos and Denman George upcoming. 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 Post SBRT to dominant LLL nodule, which is stable, and bilateral subcentimeter pulmonary nodules majority stable, some slightly increased by CT 08-10-14. Close observation vs other chemo vs Nivolumab. Nivolumab would be first choice if not for dermatitis concerns. 3.History of eczema since childhood, with severe flares during treatment thus far. Interventions by dermatology including steroid tapers have been helpful tho still some issues. APPRECIATE DR HALL SEEING HER 08-20-14 ESPECIALLY AS WE CONSIDER NIVOLUMAB 4.intermittent neuropathy symptoms lower legs: not typical for chemotherapy but taxol held cycle # 6, improved now.  5.PAC in 6.history migraine HAs. No HA with zofran.  7.allergy PCN  8.BTL  9. 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 Thrombocytopenia likely from chemo, no bleeding, follow 12.long past tobacco 13.transient speech difficulty with numbness of lip andleft hand early Dec. Neurology involved, no metastatic disease found. On ASA.   I will see her back after other MDs as above. Time spent 40 min including >50% counseling and coordination of care. CC this note Drs Nevada Crane, Cleda Clarks, [place].   Gordy Levan, MD   08/13/2014, 12:34 PM

## 2014-08-13 NOTE — Telephone Encounter (Signed)
Appointments made and avs printed for patient °

## 2014-08-16 DIAGNOSIS — D6959 Other secondary thrombocytopenia: Secondary | ICD-10-CM | POA: Insufficient documentation

## 2014-08-16 DIAGNOSIS — T451X5A Adverse effect of antineoplastic and immunosuppressive drugs, initial encounter: Secondary | ICD-10-CM

## 2014-08-18 ENCOUNTER — Telehealth: Payer: Self-pay

## 2014-08-18 NOTE — Telephone Encounter (Signed)
-----   Message from Gordy Levan, MD sent at 08/16/2014  9:22 AM EDT ----- Please fax my note 3-31 to Dr Allyn Kenner [phone] "for appointment 08-20-14 @ 0900"  thanks

## 2014-08-18 NOTE — Telephone Encounter (Signed)
Faxed 08-13-14 office note as requested below by [facility].

## 2014-08-21 ENCOUNTER — Ambulatory Visit
Admission: RE | Admit: 2014-08-21 | Discharge: 2014-08-21 | Disposition: A | Discharge status: Home / Self Care | Payer: 59 | Source: Ambulatory Visit | Attending: Radiation Oncology | Admitting: Radiation Oncology

## 2014-08-21 ENCOUNTER — Encounter: Payer: Self-pay | Admitting: Radiation Oncology

## 2014-08-21 ENCOUNTER — Other Ambulatory Visit (HOSPITAL_BASED_OUTPATIENT_CLINIC_OR_DEPARTMENT_OTHER): Payer: 59

## 2014-08-21 VITALS — BP 120/71 | HR 94 | Temp 98.8°F | Resp 20 | Wt 126.9 lb

## 2014-08-21 DIAGNOSIS — C519 Malignant neoplasm of vulva, unspecified: Secondary | ICD-10-CM | POA: Diagnosis present

## 2014-08-21 DIAGNOSIS — C7800 Secondary malignant neoplasm of unspecified lung: Secondary | ICD-10-CM | POA: Diagnosis not present

## 2014-08-21 DIAGNOSIS — C778 Secondary and unspecified malignant neoplasm of lymph nodes of multiple regions: Secondary | ICD-10-CM | POA: Diagnosis not present

## 2014-08-21 DIAGNOSIS — C7982 Secondary malignant neoplasm of genital organs: Secondary | ICD-10-CM | POA: Diagnosis not present

## 2014-08-21 DIAGNOSIS — C792 Secondary malignant neoplasm of skin: Secondary | ICD-10-CM

## 2014-08-21 DIAGNOSIS — C3432 Malignant neoplasm of lower lobe, left bronchus or lung: Secondary | ICD-10-CM

## 2014-08-21 DIAGNOSIS — C799 Secondary malignant neoplasm of unspecified site: Secondary | ICD-10-CM

## 2014-08-21 DIAGNOSIS — IMO0002 Reserved for concepts with insufficient information to code with codable children: Secondary | ICD-10-CM

## 2014-08-21 LAB — COMPREHENSIVE METABOLIC PANEL (CC13)
ALBUMIN: 3.5 g/dL (ref 3.5–5.0)
ALK PHOS: 89 U/L (ref 40–150)
ALT: 9 U/L (ref 0–55)
AST: 14 U/L (ref 5–34)
Anion Gap: 11 mEq/L (ref 3–11)
BILIRUBIN TOTAL: 0.31 mg/dL (ref 0.20–1.20)
BUN: 15.6 mg/dL (ref 7.0–26.0)
CO2: 25 mEq/L (ref 22–29)
Calcium: 9.1 mg/dL (ref 8.4–10.4)
Chloride: 102 mEq/L (ref 98–109)
Creatinine: 1 mg/dL (ref 0.6–1.1)
EGFR: 61 mL/min/{1.73_m2} — ABNORMAL LOW (ref >90)
GLUCOSE: 107 mg/dL (ref 70–140)
POTASSIUM: 4.7 meq/L (ref 3.5–5.1)
Sodium: 139 mEq/L (ref 136–145)
Total Protein: 6.5 g/dL (ref 6.4–8.3)

## 2014-08-21 LAB — CBC WITH DIFFERENTIAL/PLATELET
BASO%: 0.5 % (ref 0.0–2.0)
Basophils Absolute: 0 10*3/uL (ref 0.0–0.1)
EOS%: 3.9 % (ref 0.0–7.0)
Eosinophils Absolute: 0.2 10*3/uL (ref 0.0–0.5)
HCT: 31.5 % — ABNORMAL LOW (ref 34.8–46.6)
HGB: 10.2 g/dL — ABNORMAL LOW (ref 11.6–15.9)
LYMPH%: 14 % (ref 14.0–49.7)
MCH: 31.7 pg (ref 25.1–34.0)
MCHC: 32.5 g/dL (ref 31.5–36.0)
MCV: 97.6 fL (ref 79.5–101.0)
MONO#: 0.5 10*3/uL (ref 0.1–0.9)
MONO%: 8.8 % (ref 0.0–14.0)
NEUT#: 4.3 10*3/uL (ref 1.5–6.5)
NEUT%: 72.8 % (ref 38.4–76.8)
Platelets: 150 10*3/uL (ref 145–400)
RBC: 3.23 10*6/uL — AB (ref 3.70–5.45)
RDW: 18.5 % — ABNORMAL HIGH (ref 11.2–14.5)
WBC: 6 10*3/uL (ref 3.9–10.3)
lymph#: 0.8 10*3/uL — ABNORMAL LOW (ref 0.9–3.3)

## 2014-08-21 LAB — MAGNESIUM (CC13): MAGNESIUM: 1.8 mg/dL (ref 1.5–2.5)

## 2014-08-21 MED ORDER — SILVER SULFADIAZINE 1 % EX CREA
TOPICAL_CREAM | Freq: Two times a day (BID) | CUTANEOUS | Status: DC
  Administered 2014-08-21: 15:00:00 via TOPICAL

## 2014-08-21 NOTE — Progress Notes (Signed)
Patient denies pain, fatigue, loss of appetite. She completed chemo 2 weeks ago, states Dr Marko Plume sent her to dermatologist prior to possibly beginning another round of chemotherapy. She states she saw dermatologist for her rash which she continues to have generalized over her body. She is applying Kenalog cream.  She has a rash on her upper legs in the crease next to her perineum she states was caused by a new pair of underwear's elastic. She is applying Silvadene cream to this area, stating she cannot apply the cream the dermatologist gave her per cream's instructions. She is soaking in tube with OTC lotion and warm water. She denies urinary and bowel issues, vaginal discharge. She has no other c/o, issues today.

## 2014-08-21 NOTE — Addendum Note (Signed)
Encounter addended by: Jacqulyn Liner, RN on: 08/21/2014  3:18 PM<BR>     Documentation filed: Medications, Orders, Dx Association, Inpatient [facility]

## 2014-08-21 NOTE — Addendum Note (Signed)
Encounter addended by: Jacqulyn Liner, RN on: 08/21/2014  3:12 PM<BR>     Documentation filed: Orders, Dx Association, Inpatient MAR, Medications

## 2014-08-21 NOTE — Progress Notes (Signed)
Radiation Oncology         [phone]) [phone] ________________________________  Name: Jody Beard MRN: [id number]  Date: 08/21/2014  DOB: 1950/11/23  Follow-Up Visit Note  outpatient  CC: Rubbie Battiest, MD  Janie Morning, MD  Diagnosis and Prior Radiotherapy:  No diagnosis found. Original Stage IIIB [id number] Vulvar squamous cell carcinoma  - Now Stage IV with lung metastases Indication for treatment:  Curative, with concurrent  cisplatin chemotherapy    Radiation treatment dates:  03/09/2014-04/23/2014 Site/dose:   1) vulva, vaginal, and pelvic/inguinal nodes / 60 Gy in 30 fractions to gross disease, 54 Gy in 30 fractions to high risk areas, 50.4Gy in 30 fractions to intermediate risk areas 2) Vulvar, vaginal, and nodal boost / 6 Gy in 3 fractions to gross disease  DIAGNOSIS: Stage IB [id number] Left lower lung squamous cell carcinoma INDICATION FOR TREATMENT: Curative TREATMENT DATES:  10/16, 10/19 and 03/04/14                        SITE/DOSE:   Left lower lung tumor / 54Gy in 3 fractions  (SBRT)                           Narrative:  The patient returns today for routine follow-up. Patient denies pain, fatigue, loss of appetite. She completed chemo 2 weeks ago, states Dr Marko Plume sent her to dermatologist prior to possibly beginning another round of chemotherapy. She states she saw dermatologist for her rash which she continues to have generalized over her body. She is applying Kenalog cream. Also applying CRISCO. She has a rash on her upper legs in the crease next to her perineum she states was caused by a new pair of underwear's elastic. She is applying Silvadene cream to this area. She is soaking in tube with OTC lotion and warm water. She denies urinary and bowel issues, vaginal discharge. She has no other c/o, issues today.   ALLERGIES:  is allergic to other and penicillins.  Meds: Current Outpatient Prescriptions  Medication Sig Dispense Refill  . diphenhydrAMINE (BENADRYL) 25 mg capsule  Take 25 mg by mouth every 6 (six) hours as needed for allergies or sleep.     . ferrous fumarate (HEMOCYTE - 106 MG FE) 325 (106 FE) MG TABS tablet Take one tablet daily on empty stomach with OJ. 30 each 4  . hydrOXYzine (VISTARIL) 25 MG capsule Take 25-50 mg by mouth at bedtime as needed for itching.    Marland Kitchen LORazepam (ATIVAN) 0.5 MG tablet Take 0.5 mg by mouth every 6 (six) hours as needed for anxiety.   0  . Nutritional Supplements (CARNATION BREAKFAST ESSENTIALS) PACK Take 0.5 packets by mouth daily with breakfast.    . silver sulfADIAZINE (SILVADENE) 1 % cream Apply 1 application topically daily.    Marland Kitchen triamcinolone cream (KENALOG) 0.1 % Apply 1 application topically 2 (two) times daily.  3  . Halcinonide 0.1 % CREA Apply 1 application topically as needed (for rash).     Marland Kitchen lidocaine-prilocaine (EMLA) cream Apply to Porta-Cath site 1-2 hours prior to access as directed. (Patient not taking: Reported on 08/21/2014) 30 g 1   No current facility-administered medications for this encounter.    Physical Findings: The patient is in no acute distress. Patient is alert and oriented.  weight is 126 lb 14.4 oz (57.561 kg). Her oral temperature is 98.8 F (37.1 C). Her blood pressure is 120/71 and her pulse is  94. Her respiration is 20 and oxygen saturation is 100%. Marland Kitchen    GYN No sign of vulvar recurrence. No groin masses. Skin of left groin and vulva appears moist  General: Alert and oriented, in no acute distress HEENT: Head is normocephalic. Extraocular movements are intact.  Neck: Neck is supple, no palpable cervical or supraclavicular lymphadenopathy. Heart: Regular in rate and rhythm with no murmurs, rubs, or gallops. Chest: Clear to auscultation bilaterally, with no rhonchi, wheezes, or rales. Extremities: No cyanosis or edema. Lymphatics: see Neck Exam and GYN/groin Skin: Diffuse erythematous, dry skin rash over body.  Psychiatric: Judgment and insight are intact. Affect is  appropriate.     Lab Findings: Lab Results  Component Value Date   WBC 6.0 08/21/2014   HGB 10.2* 08/21/2014   HCT 31.5* 08/21/2014   MCV 97.6 08/21/2014   PLT 150 08/21/2014    Radiographic Findings: Ct Chest W Contrast  08/10/2014   CLINICAL DATA:  Advanced squamous cell carcinoma of vulva involving distal vagina, right inguinal and external iliac nodes, with metastatic disease to skin of right thigh and possibly to lung.  EXAM: CT CHEST, ABDOMEN, AND PELVIS WITH CONTRAST  TECHNIQUE: Multidetector CT imaging of the chest, abdomen and pelvis was performed following the standard protocol during bolus administration of intravenous contrast.  CONTRAST:  18mL OMNIPAQUE IOHEXOL 300 MG/ML  SOLN  FINDINGS: CT CHEST FINDINGS  Mediastinum/Nodes: No axillary or supraclavicular lymphadenopathy. Port in the right anterior chest wall. No mediastinal hilar lymphadenopathy. No pericardial fluid.  Lungs/Pleura: Multiple small bilateral pulmonary nodules are demonstrated. The majority of these nodules are not changed in size. Several nodules are slightly larger. 5 mm nodule in the right lung apex compares to 3 mm prior (image 13 series). 4 mm left lower lobe nodule on image 49 is more dense.  The cavitary thick-walled lesion in the left lower lobe is not changed measuring 32 x 21 mm compared to 32 x 21 mm. No new nodules.  CT ABDOMEN AND PELVIS FINDINGS  Hepatobiliary: Several tiny 2 mm lesions in left and right hepatic lobes. These foci are most consistent with ahepatic cysts although not clearly seen prior.  Pancreas: Pancreas is normal. No ductal dilatation. No pancreatic inflammation.  Spleen: Normal spleen  Adrenals/Urinary Tract: Adrenal glands are normal. Kidneys, ureters, bladder normal.  Stomach/Bowel: The stomach, small-bowel, cecum normal. The colon rectosigmoid are.  Vascular/Lymphatic: Abdominal aorta is normal caliber. There is no retroperitoneal or periportal lymphadenopathy. No pelvic  lymphadenopathy.  Reproductive: Post hysterectomy anatomy. External genitalia is poorly evaluated no pelvic lymphadenopathy. There is some presacral perirectal fat haziness related to radiation.  Musculoskeletal: No aggressive osseous lesion  IMPRESSION: Chest Impression:  1. Majority of the bilateral pulmonary nodules are not changed in size. 2. Two 5 mm nodules do measure slightly greater.  Abdomen / Pelvis Impression:  1. Tiny hypodensities in are likely benign but not well demonstrated on comparison exams. 2. No metastatic adenopathy. 3. External genitalia is not well evaluated by CT.   Electronically Signed   By: Suzy Bouchard M.D.   On: 08/10/2014 15:41   Ct Abdomen Pelvis W Contrast  08/10/2014   CLINICAL DATA:  Advanced squamous cell carcinoma of vulva involving distal vagina, right inguinal and external iliac nodes, with metastatic disease to skin of right thigh and possibly to lung.  EXAM: CT CHEST, ABDOMEN, AND PELVIS WITH CONTRAST  TECHNIQUE: Multidetector CT imaging of the chest, abdomen and pelvis was performed following the standard protocol during bolus administration  of intravenous contrast.  CONTRAST:  176mL OMNIPAQUE IOHEXOL 300 MG/ML  SOLN  FINDINGS: CT CHEST FINDINGS  Mediastinum/Nodes: No axillary or supraclavicular lymphadenopathy. Port in the right anterior chest wall. No mediastinal hilar lymphadenopathy. No pericardial fluid.  Lungs/Pleura: Multiple small bilateral pulmonary nodules are demonstrated. The majority of these nodules are not changed in size. Several nodules are slightly larger. 5 mm nodule in the right lung apex compares to 3 mm prior (image 13 series). 4 mm left lower lobe nodule on image 49 is more dense.  The cavitary thick-walled lesion in the left lower lobe is not changed measuring 32 x 21 mm compared to 32 x 21 mm. No new nodules.  CT ABDOMEN AND PELVIS FINDINGS  Hepatobiliary: Several tiny 2 mm lesions in left and right hepatic lobes. These foci are most  consistent with ahepatic cysts although not clearly seen prior.  Pancreas: Pancreas is normal. No ductal dilatation. No pancreatic inflammation.  Spleen: Normal spleen  Adrenals/Urinary Tract: Adrenal glands are normal. Kidneys, ureters, bladder normal.  Stomach/Bowel: The stomach, small-bowel, cecum normal. The colon rectosigmoid are.  Vascular/Lymphatic: Abdominal aorta is normal caliber. There is no retroperitoneal or periportal lymphadenopathy. No pelvic lymphadenopathy.  Reproductive: Post hysterectomy anatomy. External genitalia is poorly evaluated no pelvic lymphadenopathy. There is some presacral perirectal fat haziness related to radiation.  Musculoskeletal: No aggressive osseous lesion  IMPRESSION: Chest Impression:  1. Majority of the bilateral pulmonary nodules are not changed in size. 2. Two 5 mm nodules do measure slightly greater.  Abdomen / Pelvis Impression:  1. Tiny hypodensities in are likely benign but not well demonstrated on comparison exams. 2. No metastatic adenopathy. 3. External genitalia is not well evaluated by CT.   Electronically Signed   By: Suzy Bouchard M.D.   On: 08/10/2014 15:41    Impression/Plan:  No sign of locoregional recurrence. Proceed with systemic therapy as directed by med/onc in future (lung nodules). Continue gyn and dermatology f/u. Silvadene provided for irritation in genitalia/groin.  F/u in 10mo, sooner if needed  _____________________________________   [name], MD

## 2014-08-21 NOTE — Addendum Note (Signed)
Encounter addended by: Jacqulyn Liner, RN on: 08/21/2014  3:08 PM<BR>     Documentation filed: Orders, [facility], Inpatient [facility]

## 2014-08-24 ENCOUNTER — Telehealth: Payer: Self-pay | Admitting: *Deleted

## 2014-08-24 NOTE — Telephone Encounter (Signed)
CALLED PATIENT TO INFORM OF FU ON 02-26-15 @ 2:40 PM, SPOKE WITH PATIENT AND SHE IS AWARE OF THIS APPT.

## 2014-08-31 ENCOUNTER — Ambulatory Visit: Payer: 59 | Attending: Gynecologic Oncology | Admitting: Gynecologic Oncology

## 2014-08-31 ENCOUNTER — Encounter: Payer: Self-pay | Admitting: Gynecologic Oncology

## 2014-08-31 VITALS — BP 144/62 | HR 72 | Temp 98.0°F | Resp 18 | Ht 61.0 in | Wt 125.9 lb

## 2014-08-31 DIAGNOSIS — Z87891 Personal history of nicotine dependence: Secondary | ICD-10-CM | POA: Insufficient documentation

## 2014-08-31 DIAGNOSIS — Z8544 Personal history of malignant neoplasm of other female genital organs: Secondary | ICD-10-CM | POA: Diagnosis not present

## 2014-08-31 DIAGNOSIS — C3492 Malignant neoplasm of unspecified part of left bronchus or lung: Secondary | ICD-10-CM | POA: Diagnosis not present

## 2014-08-31 DIAGNOSIS — C519 Malignant neoplasm of vulva, unspecified: Secondary | ICD-10-CM | POA: Insufficient documentation

## 2014-08-31 DIAGNOSIS — L309 Dermatitis, unspecified: Secondary | ICD-10-CM

## 2014-08-31 DIAGNOSIS — C3491 Malignant neoplasm of unspecified part of right bronchus or lung: Secondary | ICD-10-CM | POA: Diagnosis not present

## 2014-08-31 DIAGNOSIS — L259 Unspecified contact dermatitis, unspecified cause: Secondary | ICD-10-CM | POA: Diagnosis not present

## 2014-08-31 MED ORDER — HYDROCORTISONE 1 % EX CREA: TOPICAL_CREAM | Freq: Two times a day (BID) | CUTANEOUS | Status: DC

## 2014-08-31 NOTE — Patient Instructions (Signed)
Please call our office with any questions or concerns. 

## 2014-08-31 NOTE — Progress Notes (Signed)
Followup Note: Gyn-Onc  CC: Dr. Elonda Husky    CC:  Chief Complaint  Patient presents with  . Vulvar Cancer  contact dermatitis  Assessment/Plan:  Jody Beard  is a 64 y.o.  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 She then completed an additional 5 cycles of salvage cddp and paclitaxel with mixed response (no new abdo/pelvic disease, most chest lesions stable, 2 chest nodules slightly larger). Overall she appears to have had a mixed response clinically with resolution of the vulva, nodal, and left lower lung lesions however progression of the multiple bilateral pulmonary nodules.  I am recommending continuing chemotherapy but changing therapies to nivolumab until she has a complete clinical response in the lungs.  She is NED at the vulva and we will monitor for recurrence in 3 months. I see no indication to perform a radical vulvectomy for Mr. Tashiro while she has progressive disseminated malignancy and no adverse side effects from her vulva disease. If she were to develop a vulvar recurrence, I would consider her for a re-excision of minimal radicality for symptom relief only.  Hydrocortisone for groin dermatitis.  Recommended she reach out to Dr Lanell Persons to use vaginal dilators for vaginal agglutination.  I will see her again in 3 months, and at which time I believe she will be still undergoing chemotherapy, to monitor the vulva for persistence of effect versus recurrence/progression.  HPI:   Jody Beard is a 64 year old woman with a history of noticing a right vulvar mass that had been progressing since approximately fabric 2015. In September 2015 she was seen by Dr. Elonda Husky who performed a biopsy which confirmed squamous cell carcinoma of the vulva. The disease was extremely extensive on the vulva, with a 7cm vulvar lesion that extended additionally into the 4cmof the distal  vagina,  the vaginal submucosa and the urethra.  Satellite lesions appreciated on the inner thigh and mons.  Bilateral inguinal adenopathy noted.  During preoperative evaluation for a planned inguinal lymphadenectomy, and chest x-ray revealed a large 3 cm solid lung mass. A PET/CT was performed and was demonstrated extensive uptake in an enlarged inguinal and pelvic lymph nodes, as well as a left lower lobe lung mass that was avid and multiple bilateral pulmonary nodules. The chest mass was biopsied and confirmed to be squamous cell carcinoma. It was unclear if it was a second primary lung cancer, or a metastasis from her vulva.   Treatment planning determined that she would receive primary chemoradiation to control the disease at the vulva, inguinal and pelvic nodes, with cisplatin radiosensitizing chemotherapy. Her radiation treatment dates were between 03/09/2014 and 04/23/2014. She received 60 gray to the vulvovaginal and pelvic inguinal nodes, with a vulvovaginal and nodal boost using IMRT. She received concurrent radiosensitizing cisplatin chemotherapy.  She tolerated the radiation treatment relatively well with excellent clinical tumor regression on physical examination.  On 05/27/14 she received a PET scan which revealed good response in the pelvis, vulvar and left lower lobe, however there was interval increase in number and size of the numerous bilateral pulmonary nodules suggesting a mixed response to initial therapy.   Biopsy of the vulva (post-treatment) in January, 2016 showed no microscopic disease (a complete pathologic response at the vulva).  She then received 5 additional cycles of cddp and paclitaxel completed in March, 2016. CT of the chest abdomen and pelvis on 08/10/14 showed stable disease  in most chest nodules, with slight increase in size in 2 nodules and a stable dominant lesion. She had no new nodal disease or abdo/pelvis disease.  She has been managing severe dermatitis (whole  body) since beginning therapy and is seeing a dermatologist who has prescribed steroids for different regions of the body and vegetable oil as an emolient.   INTERVAL HX: The itch is overall improved. She is planning to start Nivolumab which is approved for [facility] of the lung, in late April, 2016.  Review of Systems:  Constitutional  Feels well,  Cardiovascular  No chest pain, shortness of breath, or edema  Pulmonary  No cough or wheeze.  Gastro Intestinal  No nausea, vomitting, or diarrhoea. No bright red blood per rectum, no abdominal pain, change in bowel movement, or constipation.  Genito Urinary  No frequency, urgency, dysuria,.  Reports discomfort sitting, no vaginal bleeding Musculo Skeletal  No myalgia, arthralgia, joint swelling or pain  Neurologic  No weakness, numbness, change in gait,  Psychology  No depression.  Patient reports anxiety about the diagnosis Skin Widespread itchy rash (see HPI)  Current Meds:  Outpatient Encounter Prescriptions as of 08/31/2014  Medication Sig  . ferrous fumarate (HEMOCYTE - 106 MG FE) 325 (106 FE) MG TABS tablet Take one tablet daily on empty stomach with OJ.  . Halcinonide 0.1 % CREA Apply 1 application topically as needed (for rash).   . hydrOXYzine (VISTARIL) 25 MG capsule Take 25-50 mg by mouth at bedtime as needed for itching.  Marland Kitchen LORazepam (ATIVAN) 0.5 MG tablet Take 0.5 mg by mouth every 6 (six) hours as needed for anxiety.   . Nutritional Supplements (CARNATION BREAKFAST ESSENTIALS) PACK Take 0.5 packets by mouth daily with breakfast.  . silver sulfADIAZINE (SILVADENE) 1 % cream Apply 1 application topically daily.  Marland Kitchen triamcinolone cream (KENALOG) 0.1 % Apply 1 application topically 2 (two) times daily.  . diphenhydrAMINE (BENADRYL) 25 mg capsule Take 25 mg by mouth every 6 (six) hours as needed for allergies or sleep.   Marland Kitchen lidocaine-prilocaine (EMLA) cream Apply to Porta-Cath site 1-2 hours prior to access as directed. (Patient not  taking: Reported on 08/21/2014)    Allergy:  Allergies  Allergen Reactions  . Other Rash    Bleach Cleaner  . Penicillins Rash    Social Hx:   History   Social History  . Marital Status: Married    Spouse Name: Fraser Din  . Number of Children: 2  . Years of Education: 12   Occupational History  . Not on file.   Social History Main Topics  . Smoking status: Former Smoker -- 1.00 packs/day for 30 years    Types: Cigarettes    Quit date: 07/13/2013  . Smokeless tobacco: Never Used     Comment: current e-cig  . Alcohol Use: No     Comment: rare  . Drug Use: No  . Sexual Activity: No   Other Topics Concern  . Not on file   Social History Narrative   Patient lives at home with Micheline Rough her husband   Patient has a high school education   Patient has 2 children    Patient is right handed    Patient is on leave of absence from [employer]     Past Surgical Hx:  Past Surgical History  Procedure Laterality Date  . Vulva /perineum biopsy  02/05/14  . Tubal ligation  1980's  . Dilation and curettage of uterus  1980's    Past Medical  Hx:  Past Medical History  Diagnosis Date  . Vulvar lesion   . Eczema   . H/O bronchitis   . Headache(784.0)     migraines years ago  . Vulvar cancer 01/2014    future radiation and chemo  . Cancer 01/2014    vulvar  . Hx of radiation therapy 03/09/14-04/23/14    vulva/vaginal, pelvic/inguinal nodes 60 Gy 30 fx, vulvar/vaginal nodal boost 6 Gy 3 fx  . History of radiation therapy 10/16, 10/19, 03/04/14     left LL lung tumor/ 54 Gy/3 fx SBRT    Past Gynecological History:  G2P2 last pap > 9 years ago. No LMP recorded. Patient is postmenopausal.  Family Hx:  Family History  Problem Relation Age of Onset  . Heart disease Mother   . Kidney cancer Father   . Prostate cancer Cousin     Vitals:  Blood pressure 144/62, pulse 72, temperature 98 F (36.7 C), temperature source Oral, resp. rate 18, height 5\' 1"  (1.549 m), weight 125 lb 14.4 oz  (57.108 kg).  Physical Exam: WD in NAD Neck  Supple NROM, without any enlargements.  Lymph Node Survey No cervical or supraclavicular  Adenopathy. No palpable inguinal lymphadenopathy. Skin Widespread erythematous, desquamating rash on the abdomen, back, upper and lower extremities. The face and neck are spared. Cardiovascular  Pulse normal rate, regularity and rhythm. S1 and S2 normal.  Lungs  Clear to auscultation bilaterally, Good air movement.  Skin  Widespread erythematous, nonraised rash worse in creases and areas of contact. Non weeping, Not infected.  Psychiatry  Alert and oriented appropriate mood affect speech and reasoning. Abdomen  Normoactive bowel sounds, abdomen soft, non-tender. Surgical  sites intact without evidence of hernia.  Back No CVA tenderness Genito Urinary  Vulva/vagina:  There is an excellent response to radiation with no gross visible tumor or palpable tumor. There is some ulceration in the right labia majora and this was chosen the previous biopsy site. Radiation changes are visible on the vulvar including tightness of skin, loss of hair, and atrophy. Severely narrowed vagina free of lesions. Agglutination present. Extremities  No bilateral cyanosis, clubbing or edema.  Donaciano Eva, MD 08/31/2014, 9:49 AM

## 2014-09-09 ENCOUNTER — Other Ambulatory Visit: Payer: Self-pay | Admitting: Oncology

## 2014-09-10 ENCOUNTER — Telehealth: Payer: Self-pay | Admitting: Oncology

## 2014-09-10 ENCOUNTER — Encounter: Payer: Self-pay | Admitting: Oncology

## 2014-09-10 ENCOUNTER — Other Ambulatory Visit (HOSPITAL_BASED_OUTPATIENT_CLINIC_OR_DEPARTMENT_OTHER): Payer: 59

## 2014-09-10 ENCOUNTER — Ambulatory Visit (HOSPITAL_BASED_OUTPATIENT_CLINIC_OR_DEPARTMENT_OTHER): Payer: 59 | Admitting: Oncology

## 2014-09-10 VITALS — BP 136/63 | HR 88 | Temp 98.2°F | Resp 18 | Ht 61.0 in | Wt 126.3 lb

## 2014-09-10 DIAGNOSIS — C519 Malignant neoplasm of vulva, unspecified: Secondary | ICD-10-CM

## 2014-09-10 DIAGNOSIS — IMO0002 Reserved for concepts with insufficient information to code with codable children: Secondary | ICD-10-CM

## 2014-09-10 DIAGNOSIS — C792 Secondary malignant neoplasm of skin: Secondary | ICD-10-CM

## 2014-09-10 DIAGNOSIS — C778 Secondary and unspecified malignant neoplasm of lymph nodes of multiple regions: Secondary | ICD-10-CM

## 2014-09-10 DIAGNOSIS — L309 Dermatitis, unspecified: Secondary | ICD-10-CM

## 2014-09-10 DIAGNOSIS — C3432 Malignant neoplasm of lower lobe, left bronchus or lung: Secondary | ICD-10-CM | POA: Diagnosis not present

## 2014-09-10 DIAGNOSIS — L259 Unspecified contact dermatitis, unspecified cause: Secondary | ICD-10-CM

## 2014-09-10 DIAGNOSIS — C3492 Malignant neoplasm of unspecified part of left bronchus or lung: Secondary | ICD-10-CM

## 2014-09-10 DIAGNOSIS — C549 Malignant neoplasm of corpus uteri, unspecified: Secondary | ICD-10-CM

## 2014-09-10 DIAGNOSIS — K219 Gastro-esophageal reflux disease without esophagitis: Secondary | ICD-10-CM

## 2014-09-10 DIAGNOSIS — C799 Secondary malignant neoplasm of unspecified site: Secondary | ICD-10-CM

## 2014-09-10 DIAGNOSIS — Z87891 Personal history of nicotine dependence: Secondary | ICD-10-CM

## 2014-09-10 DIAGNOSIS — Z95828 Presence of other vascular implants and grafts: Secondary | ICD-10-CM

## 2014-09-10 DIAGNOSIS — T451X5A Adverse effect of antineoplastic and immunosuppressive drugs, initial encounter: Secondary | ICD-10-CM

## 2014-09-10 DIAGNOSIS — D6481 Anemia due to antineoplastic chemotherapy: Secondary | ICD-10-CM

## 2014-09-10 LAB — COMPREHENSIVE METABOLIC PANEL (CC13)
ALBUMIN: 3.7 g/dL (ref 3.5–5.0)
ALK PHOS: 85 U/L (ref 40–150)
ALT: 9 U/L (ref 0–55)
AST: 15 U/L (ref 5–34)
Anion Gap: 14 mEq/L — ABNORMAL HIGH (ref 3–11)
BUN: 14.8 mg/dL (ref 7.0–26.0)
CO2: 23 mEq/L (ref 22–29)
Calcium: 9.6 mg/dL (ref 8.4–10.4)
Chloride: 103 mEq/L (ref 98–109)
Creatinine: 0.9 mg/dL (ref 0.6–1.1)
EGFR: 67 mL/min/{1.73_m2} — ABNORMAL LOW (ref >90)
Glucose: 105 mg/dl (ref 70–140)
POTASSIUM: 4.3 meq/L (ref 3.5–5.1)
SODIUM: 140 meq/L (ref 136–145)
TOTAL PROTEIN: 6.8 g/dL (ref 6.4–8.3)
Total Bilirubin: 0.24 mg/dL (ref 0.20–1.20)

## 2014-09-10 LAB — CBC WITH DIFFERENTIAL/PLATELET
BASO%: 0.7 % (ref 0.0–2.0)
BASOS ABS: 0 10*3/uL (ref 0.0–0.1)
EOS ABS: 0.2 10*3/uL (ref 0.0–0.5)
EOS%: 3.2 % (ref 0.0–7.0)
HCT: 30.7 % — ABNORMAL LOW (ref 34.8–46.6)
HGB: 10.2 g/dL — ABNORMAL LOW (ref 11.6–15.9)
LYMPH%: 18.6 % (ref 14.0–49.7)
MCH: 32.8 pg (ref 25.1–34.0)
MCHC: 33.1 g/dL (ref 31.5–36.0)
MCV: 99 fL (ref 79.5–101.0)
MONO#: 0.6 10*3/uL (ref 0.1–0.9)
MONO%: 9.3 % (ref 0.0–14.0)
NEUT#: 4.1 10*3/uL (ref 1.5–6.5)
NEUT%: 68.2 % (ref 38.4–76.8)
Platelets: 222 10*3/uL (ref 145–400)
RBC: 3.1 10*6/uL — ABNORMAL LOW (ref 3.70–5.45)
RDW: 16.5 % — AB (ref 11.2–14.5)
WBC: 6 10*3/uL (ref 3.9–10.3)
lymph#: 1.1 10*3/uL (ref 0.9–3.3)

## 2014-09-10 NOTE — Progress Notes (Signed)
OFFICE PROGRESS NOTE   September 10, 2014   Physicians:Emma Rossi/ W.Skeet Latch ,[name], Alfredia Ferguson.Luking (PCP  INTERVAL HISTORY:  Patient is seen, together with daughter, in continuing attention to metastatic squamous cell carcinoma of vulva and metastatic squamous cell carcinoma involving lung, which may be second primary. She has had good local response in vulvar area to radiation and chemotherapy, but progression in lungs. Most recent imaging was CT CAP 08-10-14. She saw Drs Denman George and Isidore Moos recently, their notes reviewed by MD.  She recently also saw Dr Allyn Kenner, with adjustments in medications for her chronic eczema, particularly as we are considering treatment with nivolumab, which can cause dermatitis. Eczema is much better face, UE, trunk now using plain Crisco and different detergent at Dr Juel Burrow recommendation, However, she has puritic rash over both LE x ~ 2 days,  apparently from contact with flea medication on dog. Dr Juel Burrow office note received for this visit and reviewed, will be scanned into EMR.  Patient otherwise is feeling better overall. She is not very active but is up all day at home, and denies SOB, cough, chest pain. Only discomfort in perineal area is in inguinal areas from clothing aggravating the eczema, but no vulvar or perirectal discomfort. Appetite is good tho more GERD symptoms,  bowels are moving normally, no bleeding.   PAC in Flu vaccine done  ONCOLOGIC HISTORY Patient presented with gradually enlarging mass at right vulva for ~ 8 months, uncomfortable with direct pressure and ulcerated with occasional bleeding. She was seen in ED on 02-02-14 with right inguinal adenopathy and right vulvar masses, by Dr Elonda Husky on 02-03-14, then by Dr Skeet Latch on 02-05-14, with biopsy of both vulvar mass and satellite lesions on inner thigh and mons. Exam found 7 cm right vulvar mass with 4 cm extension to distal vagina, extending anteriorly to  urethral meatus and in ischiorectal area, with satellite lesions inner thigh and right mons. Pathology [phone]) showed invasive squamous cell carcinoma arising in background of high grade squamous intraepithelial lesion from vulva biopsy and invasive moderately differentiated squamous cell carcinoma from inner thigh lesion. CXR 02-06-14 showed left lower lobe lung mass 3.6 x 3.6 x 3.6 cm, with no adenopathy and no bony lesions, emphysematous changes. CT chest 02-09-14 showed the lower lobe mass 3.7 x 3.5 x 3.3 cm as well as 3 mm RLL nodule and faint 4 mm LUL nodule, no mediastinal or hilar nodes, no axillary or supraclavicular nodes, and upper abdomen not remarkable. PET 02-18-14 measured LLL lung mass 4.6 x 3.9 cm, hypermetabolic, and noted multiple subcentimeter nodules scattered thruout lungs bilaterally; the right vulvar mass was also hypermetabolic, extending up into right side of lower vagina, extensive R>L and right external iliac adenopathy, no uptake in skeleton. 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 She had 3 stereotactic radiation treatments to the LLL pulmonary mass 54 Gy in 3 fractions 10-16, 10-19 and 03-04-14. She received IMRT to vulvar area from 03-09-14, completed 04-23-14, with weekly CDDP x7 03-09-14 thru 04-20-14. Radiation to vulva, vaginal, and pelvic/inguinal nodes was 60 Gy in 30 fractions to gross disease, 54 Gy in 30 fractions to high risk areas, 50.4Gy in 30 fractions to intermediate risk areas. She had vulvar, vaginal, and nodal boost of 6 Gy in 3 fractions to gross disease. Follow up CT chest 05-04-14 had cavitary, spiculated LLL mass 3.3 x 2.3 cm, having been 4.6 x 3.9 cm prior  to IMRT, and no change in scattered small bilateral pulmonary nodules, however PET 05-27-14 had progression in bilateral pulmonary nodules and partial response in local vulvar involvement. She began CDDP taxol on 06-22-14, with #6 given 07-27-14.  CT CAP 08-10-14 had majority of lung nodules stable with a few slightly increased, 2 mm hepatic lucencies possibly cysts, no adenopathy AP.       Review of systems as above, also: No fever or symptoms of infection. No bleeding. No LE swelling. No problems with PAC Remainder of 10 point Review of Systems negative.  Objective:  Vital signs in last 24 hours:  BP 136/63 mmHg  Pulse 88  Temp(Src) 98.2 F (36.8 C) (Oral)  Resp 18  Ht _0  (1.549 m)  Wt 126 lb 4.8 oz (57.289 kg)  BMI 23.88 kg/m2 Weight up 1 lb Alert, oriented and appropriate. Ambulatory without difficulty. Pleasant and talkative, NAD. Daughter very supportive  HEENT:PERRL, sclerae not icteric. Oral mucosa moist without lesions, posterior pharynx clear.  Neck supple. No JVD.  Lymphatics:no cervical,supraclavicular,  inguinal adenopathy Resp: diminished breath sounds thruout, otherwise clear to auscultation bilaterally and normal percussion bilaterally Cardio: regular rate and rhythm. No gallop. GI: soft, nontender, not distended, no mass or organomegaly. Normally active bowel sounds.  Musculoskeletal/ Extremities: without pitting edema, cords, tenderness Neuro: no peripheral neuropathy. Otherwise nonfocal Skin  Face, neck, trunk, UE with only minimal areas of eczema scattered, skin not dry. LE bilaterally with multiple excoriated papular areas diffusely, no obvious infection, and otherwise without ecchymosis, petechiae  Portacath-without erythema or tenderness  Lab Results:  Results for orders placed or performed in visit on 09/10/14  CBC with Differential  Result Value Ref Range   WBC 6.0 3.9 - 10.3 10e3/uL   NEUT# 4.1 1.5 - 6.5 10e3/uL   HGB 10.2 (L) 11.6 - 15.9 g/dL   HCT 30.7 (L) 34.8 - 46.6 %   Platelets 222 145 - 400 10e3/uL   MCV 99.0 79.5 - 101.0 fL   MCH 32.8 25.1 - 34.0 pg   MCHC 33.1 31.5 - 36.0 g/dL   RBC 3.10 (L) 3.70 - 5.45 10e6/uL   RDW 16.5 (H) 11.2 - 14.5 %   lymph# 1.1 0.9 - 3.3 10e3/uL    MONO# 0.6 0.1 - 0.9 10e3/uL   Eosinophils Absolute 0.2 0.0 - 0.5 10e3/uL   Basophils Absolute 0.0 0.0 - 0.1 10e3/uL   NEUT% 68.2 38.4 - 76.8 %   LYMPH% 18.6 14.0 - 49.7 %   MONO% 9.3 0.0 - 14.0 %   EOS% 3.2 0.0 - 7.0 %   BASO% 0.7 0.0 - 2.0 %  Comprehensive metabolic panel (Cmet) - [facility]  Result Value Ref Range   Sodium 140 136 - 145 mEq/L   Potassium 4.3 3.5 - 5.1 mEq/L   Chloride 103 98 - 109 mEq/L   CO2 23 22 - 29 mEq/L   Glucose 105 70 - 140 mg/dl   BUN 14.8 7.0 - 26.0 mg/dL   Creatinine 0.9 0.6 - 1.1 mg/dL   Total Bilirubin 0.24 0.20 - 1.20 mg/dL   Alkaline Phosphatase 85 40 - 150 U/L   AST 15 5 - 34 U/L   ALT 9 0 - 55 U/L   Total Protein 6.8 6.4 - 8.3 g/dL   Albumin 3.7 3.5 - 5.0 g/dL   Calcium 9.6 8.4 - 10.4 mg/dL   Anion Gap 14 (H) 3 - 11 mEq/L   EGFR 67 (L) >90 ml/min/1.73 m2     Studies/Results:  No results found.  Medications: I have reviewed the patient's current medications. WIll begin protonix 40 mg daily or equivalent for GERD. Now taking oral iron with Vit C tablet  DISCUSSION: again discussed possible treatment with nivolumab with patient and daughter, this particularly as metastatic squamous cell carcinoma involving lung may well be primary lung squamous cell, with biopsy of the dominant LLL mass consistent with lung primary. Patient understands that skin reaction is possible with nivolumab, however this agent may also be of more benefit for improvement and control of the metastatic disease in lungs than any further chemotherapy. I have told her that the nivolumab would be dosed at 50% for first cycle given her dermatologic history. Patient wants to proceed with nivolumab. Orders placed with request for preauthorization.   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 Improved local disease with RT/ sensitizing  CDDP completed 04-23-14, and additional taxol CDDP thru 07-27-14. Drs Isidore Moos and Denman George following. Teaching done by RN. 2.Squamous cell carcinoma of LLL lung: long past tobacco; imaging and final path suggests primary lung, tho same histology as vulvar primary. Post SBRT to dominant LLL nodule, which is stable, and bilateral subcentimeter pulmonary nodules majority stable, some slightly increased by CT 08-10-14. Will try careful treatment with nivolumab if insurance allows. 3.History of eczema since childhood, with severe flares during treatment thus far. Interventions by dermatology helpful. Dr [place] Crane is aware of our treatment plans and will see patient at any time if needed. Pruitic rash now on legs from flea medication on dog. 4.intermittent neuropathy symptoms lower legs: not typical for chemotherapy but taxol held cycle # 6, improved now.  5.PAC in 6.history migraine HAs. No HA with zofran.  7.allergy PCN  8.BTL  9. 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 Thrombocytopenia resolved. 12.long past tobacco 13.transient speech difficulty with numbness of lip andleft hand early Dec. Neurology involved, no metastatic disease found. On ASA.   Tentatively plan nivolumab to start in ~ 2 weeks, to allow preauthorization. I will see her back week following treatment. All questions answered, patient and daughter in agreement with plans. Verbal consent for treatment obtained. Nivolumab orders placed. Time spent 40 min including >50% counseling and coordination of care.  Kyston Gonce P, MD   09/10/2014, 1:35 PM

## 2014-09-10 NOTE — Telephone Encounter (Signed)
gave and printed appt sched and avs fo rpt fo rMay and JUNE....sed added tx

## 2014-09-11 ENCOUNTER — Telehealth: Payer: Self-pay

## 2014-09-11 DIAGNOSIS — IMO0002 Reserved for concepts with insufficient information to code with codable children: Secondary | ICD-10-CM

## 2014-09-11 DIAGNOSIS — C799 Secondary malignant neoplasm of unspecified site: Secondary | ICD-10-CM

## 2014-09-11 DIAGNOSIS — K219 Gastro-esophageal reflux disease without esophagitis: Secondary | ICD-10-CM

## 2014-09-11 MED ORDER — PANTOPRAZOLE SODIUM 40 MG PO TBEC: 40.0000 mg | DELAYED_RELEASE_TABLET | Freq: Every day | ORAL | Status: DC

## 2014-09-11 NOTE — Telephone Encounter (Signed)
Prescription was E-prescribed as noted below.

## 2014-09-11 NOTE — Telephone Encounter (Signed)
-----   Message from Gordy Levan, MD sent at 09/11/2014  8:19 AM EDT ----- Please send script for protonix 40 mg 1 daily # 30 5 RF. OK for pharmacy to substitute another prescription if her ins prefers thanks

## 2014-09-13 ENCOUNTER — Other Ambulatory Visit: Payer: Self-pay | Admitting: Oncology

## 2014-09-13 DIAGNOSIS — L309 Dermatitis, unspecified: Secondary | ICD-10-CM | POA: Insufficient documentation

## 2014-09-13 DIAGNOSIS — IMO0002 Reserved for concepts with insufficient information to code with codable children: Secondary | ICD-10-CM

## 2014-09-13 DIAGNOSIS — L259 Unspecified contact dermatitis, unspecified cause: Secondary | ICD-10-CM | POA: Insufficient documentation

## 2014-09-13 DIAGNOSIS — C3492 Malignant neoplasm of unspecified part of left bronchus or lung: Secondary | ICD-10-CM

## 2014-09-13 DIAGNOSIS — C799 Secondary malignant neoplasm of unspecified site: Secondary | ICD-10-CM

## 2014-09-14 ENCOUNTER — Telehealth: Payer: Self-pay | Admitting: Oncology

## 2014-09-14 NOTE — Telephone Encounter (Signed)
Faxed pt office note to Dr. Nevada Crane [phone]

## 2014-09-24 ENCOUNTER — Ambulatory Visit (HOSPITAL_BASED_OUTPATIENT_CLINIC_OR_DEPARTMENT_OTHER): Payer: 59

## 2014-09-24 ENCOUNTER — Other Ambulatory Visit (HOSPITAL_BASED_OUTPATIENT_CLINIC_OR_DEPARTMENT_OTHER): Payer: 59

## 2014-09-24 VITALS — BP 123/58 | HR 73 | Temp 98.1°F | Resp 18

## 2014-09-24 DIAGNOSIS — C3432 Malignant neoplasm of lower lobe, left bronchus or lung: Secondary | ICD-10-CM | POA: Diagnosis not present

## 2014-09-24 DIAGNOSIS — Z5112 Encounter for antineoplastic immunotherapy: Secondary | ICD-10-CM | POA: Diagnosis not present

## 2014-09-24 DIAGNOSIS — C792 Secondary malignant neoplasm of skin: Secondary | ICD-10-CM

## 2014-09-24 DIAGNOSIS — C778 Secondary and unspecified malignant neoplasm of lymph nodes of multiple regions: Secondary | ICD-10-CM

## 2014-09-24 DIAGNOSIS — C799 Secondary malignant neoplasm of unspecified site: Secondary | ICD-10-CM

## 2014-09-24 DIAGNOSIS — E032 Hypothyroidism due to medicaments and other exogenous substances: Secondary | ICD-10-CM

## 2014-09-24 DIAGNOSIS — C549 Malignant neoplasm of corpus uteri, unspecified: Secondary | ICD-10-CM | POA: Diagnosis not present

## 2014-09-24 DIAGNOSIS — IMO0002 Reserved for concepts with insufficient information to code with codable children: Secondary | ICD-10-CM

## 2014-09-24 DIAGNOSIS — C3492 Malignant neoplasm of unspecified part of left bronchus or lung: Secondary | ICD-10-CM

## 2014-09-24 DIAGNOSIS — Z79899 Other long term (current) drug therapy: Secondary | ICD-10-CM

## 2014-09-24 LAB — COMPREHENSIVE METABOLIC PANEL (CC13)
ALBUMIN: 3.6 g/dL (ref 3.5–5.0)
ALK PHOS: 94 U/L (ref 40–150)
ALT: 15 U/L (ref 0–55)
AST: 20 U/L (ref 5–34)
Anion Gap: 12 mEq/L — ABNORMAL HIGH (ref 3–11)
BUN: 16 mg/dL (ref 7.0–26.0)
CALCIUM: 9.5 mg/dL (ref 8.4–10.4)
CO2: 23 mEq/L (ref 22–29)
Chloride: 105 mEq/L (ref 98–109)
Creatinine: 0.9 mg/dL (ref 0.6–1.1)
EGFR: 67 mL/min/{1.73_m2} — ABNORMAL LOW (ref >90)
Glucose: 96 mg/dl (ref 70–140)
POTASSIUM: 4.4 meq/L (ref 3.5–5.1)
Sodium: 140 mEq/L (ref 136–145)
Total Bilirubin: 0.3 mg/dL (ref 0.20–1.20)
Total Protein: 6.9 g/dL (ref 6.4–8.3)

## 2014-09-24 LAB — CBC WITH DIFFERENTIAL/PLATELET
BASO%: 0.8 % (ref 0.0–2.0)
BASOS ABS: 0 10*3/uL (ref 0.0–0.1)
EOS%: 3.8 % (ref 0.0–7.0)
Eosinophils Absolute: 0.2 10*3/uL (ref 0.0–0.5)
HEMATOCRIT: 30.9 % — AB (ref 34.8–46.6)
HEMOGLOBIN: 10.3 g/dL — AB (ref 11.6–15.9)
LYMPH%: 14.2 % (ref 14.0–49.7)
MCH: 33.3 pg (ref 25.1–34.0)
MCHC: 33.4 g/dL (ref 31.5–36.0)
MCV: 99.7 fL (ref 79.5–101.0)
MONO#: 0.6 10*3/uL (ref 0.1–0.9)
MONO%: 9.8 % (ref 0.0–14.0)
NEUT#: 4.4 10*3/uL (ref 1.5–6.5)
NEUT%: 71.4 % (ref 38.4–76.8)
Platelets: 230 10*3/uL (ref 145–400)
RBC: 3.1 10*6/uL — ABNORMAL LOW (ref 3.70–5.45)
RDW: 14.5 % (ref 11.2–14.5)
WBC: 6.1 10*3/uL (ref 3.9–10.3)
lymph#: 0.9 10*3/uL (ref 0.9–3.3)

## 2014-09-24 LAB — TSH CHCC: TSH: 4.045 m[IU]/L — AB (ref 0.308–3.960)

## 2014-09-24 MED ORDER — SODIUM CHLORIDE 0.9 % IV SOLN
Freq: Once | INTRAVENOUS | Status: AC
  Administered 2014-09-24: 09:00:00 via INTRAVENOUS

## 2014-09-24 MED ORDER — SODIUM CHLORIDE 0.9 % IV SOLN
1.5000 mg/kg | Freq: Once | INTRAVENOUS | Status: AC
  Administered 2014-09-24: 90 mg via INTRAVENOUS
  Filled 2014-09-24: qty 9

## 2014-09-24 MED ORDER — SODIUM CHLORIDE 0.9 % IJ SOLN
10.0000 mL | INTRAMUSCULAR | Status: DC | PRN
  Administered 2014-09-24: 10 mL
  Filled 2014-09-24: qty 10

## 2014-09-24 MED ORDER — HEPARIN SOD (PORK) LOCK FLUSH 100 UNIT/ML IV SOLN
500.0000 [IU] | Freq: Once | INTRAVENOUS | Status: AC | PRN
  Administered 2014-09-24: 500 [IU]
  Filled 2014-09-24: qty 5

## 2014-09-24 NOTE — Patient Instructions (Signed)
[facility] Discharge Instructions for Patients Receiving Chemotherapy  Today you received the following chemotherapy agents Nivolumab.  To help prevent nausea and vomiting after your treatment, we encourage you to take your nausea medication as directed.    If you develop nausea and vomiting that is not controlled by your nausea medication, call the clinic.   BELOW ARE SYMPTOMS THAT SHOULD BE REPORTED IMMEDIATELY:  *FEVER GREATER THAN 100.5 F  *CHILLS WITH OR WITHOUT FEVER  NAUSEA AND VOMITING THAT IS NOT CONTROLLED WITH YOUR NAUSEA MEDICATION  *UNUSUAL SHORTNESS OF BREATH  *UNUSUAL BRUISING OR BLEEDING  TENDERNESS IN MOUTH AND THROAT WITH OR WITHOUT PRESENCE OF ULCERS  *URINARY PROBLEMS  *BOWEL PROBLEMS  UNUSUAL RASH Items with * indicate a potential emergency and should be followed up as soon as possible.  Feel free to call the clinic you have any questions or concerns. The clinic phone number is [phone].  Please show the CHEMO ALERT CARD at check-in to the Emergency Department and triage nurse.  Nivolumab injection What is this medicine? NIVOLUMAB (nye VOL ue mab) is used to treat certain types of melanoma and lung cancer. This medicine may be used for other purposes; ask your health care provider or pharmacist if you have questions. COMMON BRAND NAME(S): Opdivo What should I tell my health care provider before I take this medicine? They need to know if you have any of these conditions: -eye disease, vision problems -history of pancreatitis -immune system problems -inflammatory bowel disease -kidney disease -liver disease -lung disease -lupus -myasthenia gravis -multiple sclerosis -organ transplant -stomach or intestine problems -thyroid disease -tingling of the fingers or toes, or other nerve disorder -an unusual or allergic reaction to nivolumab, other medicines, foods, dyes, or preservatives -pregnant or trying to get  pregnant -breast-feeding How should I use this medicine? This medicine is for infusion into a vein. It is given by a health care professional in a hospital or clinic setting. A special MedGuide will be given to you before each treatment. Be sure to read this information carefully each time. Talk to your pediatrician regarding the use of this medicine in children. Special care may be needed. Overdosage: If you think you've taken too much of this medicine contact a poison control center or emergency room at once. Overdosage: If you think you have taken too much of this medicine contact a poison control center or emergency room at once. NOTE: This medicine is only for you. Do not share this medicine with others. What if I miss a dose? It is important not to miss your dose. Call your doctor or health care professional if you are unable to keep an appointment. What may interact with this medicine? Interactions have not been studied. This list may not describe all possible interactions. Give your health care provider a list of all the medicines, herbs, non-prescription drugs, or dietary supplements you use. Also tell them if you smoke, drink alcohol, or use illegal drugs. Some items may interact with your medicine. What should I watch for while using this medicine? Tell your doctor or healthcare professional if your symptoms do not start to get better or if they get worse. Your condition will be monitored carefully while you are receiving this medicine. You may need blood work done while you are taking this medicine. What side effects may I notice from receiving this medicine? Side effects that you should report to your doctor or health care professional as soon as possible: -allergic reactions like   skin rash, itching or hives, swelling of the face, lips, or tongue -black, tarry stools -bloody or watery diarrhea -changes in vision -chills -cough -depressed mood -eye pain -feeling  anxious -fever -general ill feeling or flu-like symptoms -hair loss -loss of appetite -low blood counts - this medicine may decrease the number of white blood cells, red blood cells and platelets. You may be at increased risk for infections and bleeding -pain, tingling, numbness in the hands or feet -redness, blistering, peeling or loosening of the skin, including inside the mouth -red pinpoint spots on skin -signs of decreased platelets or bleeding - bruising, pinpoint red spots on the skin, black, tarry stools, blood in the urine -signs of decreased red blood cells - unusually weak or tired, feeling faint or lightheaded, falls -signs of infection - fever or chills, cough, sore throat, pain or trouble passing urine -signs and symptoms of a dangerous change in heartbeat or heart rhythm like chest pain; dizziness; fast or irregular heartbeat; palpitations; feeling faint or lightheaded, falls; breathing problems -signs and symptoms of high blood sugar such as dizziness; dry mouth; dry skin; fruity breath; nausea; stomach pain; increased hunger or thirst; increased urination -signs and symptoms of kidney injury like trouble passing urine or change in the amount of urine -signs and symptoms of liver injury like dark yellow or brown urine; general ill feeling or flu-like symptoms; light-colored stools; loss of appetite; nausea; right upper belly pain; unusually weak or tired; yellowing of the eyes or skin -signs and symptoms of increased potassium like muscle weakness; chest pain; or fast, irregular heartbeat -signs and symptoms of low potassium like muscle cramps or muscle pain; chest pain; dizziness; feeling faint or lightheaded, falls; palpitations; breathing problems; or fast, irregular heartbeat -swelling of the ankles, feet, hands -weight gainSide effects that usually do not require medical attention (report to your doctor or health care professional if they continue or are  bothersome): -constipation -general ill feeling or flu-like symptoms -hair loss -loss of appetite -nausea, vomiting This list may not describe all possible side effects. Call your doctor for medical advice about side effects. You may report side effects to FDA at 1-800-FDA-1088. Where should I keep my medicine? This drug is given in a hospital or clinic and will not be stored at home. NOTE: This sheet is a summary. It may not cover all possible information. If you have questions about this medicine, talk to your doctor, pharmacist, or health care provider.  2015, Elsevier/Gold Standard. (2013-07-21 13:18:19)  

## 2014-09-29 ENCOUNTER — Telehealth: Payer: Self-pay

## 2014-09-29 ENCOUNTER — Other Ambulatory Visit: Payer: Self-pay | Admitting: Oncology

## 2014-09-29 NOTE — Telephone Encounter (Signed)
[name] is doing well after her Nivolumab on 09-24-14. Denies n/v, Bowels moving well. Appetite good. She began with a red, slightly raised,  blotchy, itchy rash on her right forearm and left calf.  She is using the Benadryl 25 mg OTC prn for itchiness as she has been for her previous, persistent  generalized rash. She applied the Kenalog 0.1% cream  To the new rash this am and the redness has decreased.  She will continue with benadryl and cream prn.  Ms. Ontko advised to call the office if rash worsens prior to visit 10-02-14.

## 2014-10-02 ENCOUNTER — Encounter: Payer: Self-pay | Admitting: Oncology

## 2014-10-02 ENCOUNTER — Ambulatory Visit (HOSPITAL_BASED_OUTPATIENT_CLINIC_OR_DEPARTMENT_OTHER): Payer: 59 | Admitting: Oncology

## 2014-10-02 ENCOUNTER — Other Ambulatory Visit (HOSPITAL_BASED_OUTPATIENT_CLINIC_OR_DEPARTMENT_OTHER): Payer: 59

## 2014-10-02 VITALS — BP 127/66 | HR 77 | Temp 98.2°F | Resp 18 | Ht 61.0 in | Wt 127.0 lb

## 2014-10-02 DIAGNOSIS — Z87891 Personal history of nicotine dependence: Secondary | ICD-10-CM

## 2014-10-02 DIAGNOSIS — C792 Secondary malignant neoplasm of skin: Secondary | ICD-10-CM

## 2014-10-02 DIAGNOSIS — C7801 Secondary malignant neoplasm of right lung: Secondary | ICD-10-CM

## 2014-10-02 DIAGNOSIS — C778 Secondary and unspecified malignant neoplasm of lymph nodes of multiple regions: Secondary | ICD-10-CM

## 2014-10-02 DIAGNOSIS — L309 Dermatitis, unspecified: Secondary | ICD-10-CM

## 2014-10-02 DIAGNOSIS — C3492 Malignant neoplasm of unspecified part of left bronchus or lung: Secondary | ICD-10-CM

## 2014-10-02 DIAGNOSIS — C3432 Malignant neoplasm of lower lobe, left bronchus or lung: Secondary | ICD-10-CM

## 2014-10-02 DIAGNOSIS — C549 Malignant neoplasm of corpus uteri, unspecified: Secondary | ICD-10-CM

## 2014-10-02 DIAGNOSIS — C799 Secondary malignant neoplasm of unspecified site: Secondary | ICD-10-CM

## 2014-10-02 DIAGNOSIS — C7802 Secondary malignant neoplasm of left lung: Secondary | ICD-10-CM

## 2014-10-02 DIAGNOSIS — K219 Gastro-esophageal reflux disease without esophagitis: Secondary | ICD-10-CM

## 2014-10-02 DIAGNOSIS — Z95828 Presence of other vascular implants and grafts: Secondary | ICD-10-CM

## 2014-10-02 DIAGNOSIS — D6481 Anemia due to antineoplastic chemotherapy: Secondary | ICD-10-CM

## 2014-10-02 DIAGNOSIS — IMO0002 Reserved for concepts with insufficient information to code with codable children: Secondary | ICD-10-CM

## 2014-10-02 DIAGNOSIS — C519 Malignant neoplasm of vulva, unspecified: Secondary | ICD-10-CM

## 2014-10-02 LAB — CBC WITH DIFFERENTIAL/PLATELET
BASO%: 0.3 % (ref 0.0–2.0)
Basophils Absolute: 0 10*3/uL (ref 0.0–0.1)
EOS ABS: 0.3 10*3/uL (ref 0.0–0.5)
EOS%: 5.2 % (ref 0.0–7.0)
HCT: 34.2 % — ABNORMAL LOW (ref 34.8–46.6)
HEMOGLOBIN: 11.1 g/dL — AB (ref 11.6–15.9)
LYMPH%: 19.3 % (ref 14.0–49.7)
MCH: 33.1 pg (ref 25.1–34.0)
MCHC: 32.5 g/dL (ref 31.5–36.0)
MCV: 102.1 fL — ABNORMAL HIGH (ref 79.5–101.0)
MONO#: 0.7 10*3/uL (ref 0.1–0.9)
MONO%: 10.1 % (ref 0.0–14.0)
NEUT#: 4.2 10*3/uL (ref 1.5–6.5)
NEUT%: 65.1 % (ref 38.4–76.8)
Platelets: 187 10*3/uL (ref 145–400)
RBC: 3.35 10*6/uL — AB (ref 3.70–5.45)
RDW: 13.8 % (ref 11.2–14.5)
WBC: 6.5 10*3/uL (ref 3.9–10.3)
lymph#: 1.3 10*3/uL (ref 0.9–3.3)

## 2014-10-02 LAB — COMPREHENSIVE METABOLIC PANEL (CC13)
ALT: 15 U/L (ref 0–55)
ANION GAP: 11 meq/L (ref 3–11)
AST: 18 U/L (ref 5–34)
Albumin: 3.6 g/dL (ref 3.5–5.0)
Alkaline Phosphatase: 100 U/L (ref 40–150)
BUN: 22.3 mg/dL (ref 7.0–26.0)
CALCIUM: 9.8 mg/dL (ref 8.4–10.4)
CHLORIDE: 107 meq/L (ref 98–109)
CO2: 22 meq/L (ref 22–29)
Creatinine: 1 mg/dL (ref 0.6–1.1)
EGFR: 62 mL/min/{1.73_m2} — AB (ref >90)
Glucose: 102 mg/dl (ref 70–140)
POTASSIUM: 4.2 meq/L (ref 3.5–5.1)
Sodium: 139 mEq/L (ref 136–145)
Total Bilirubin: 0.33 mg/dL (ref 0.20–1.20)
Total Protein: 7.1 g/dL (ref 6.4–8.3)

## 2014-10-02 MED ORDER — PREDNISONE 20 MG PO TABS: ORAL_TABLET | ORAL | Status: DC

## 2014-10-02 NOTE — Progress Notes (Signed)
OFFICE PROGRESS NOTE   Oct 02, 2014   Physicians:Emma Rossi/ W.Skeet Latch ,[name], Alfredia Ferguson.Luking (PCP)  INTERVAL HISTORY:  Patient is seen, together with daughter, in follow up of first cycle of nivolumab for metastatic squamous cell carcinoma involving lungs. 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  Due to significant chronic skin sensitivity and recent acute bouts of eczema prior to nivolumab, patient has been followed closely by Dr Nevada Crane in [place] as we considered the nivolumab. Cycle 1 nivolumab was dose reduced by 50% due to concern for skin reaction.   Patient did well with first nivolumab until day 7, when she developed scattered rash which has different appearance than her previous skin problems. Present rash is scattered on forearms and lower legs mostly, also some on back and abdomen "more dry" and less confluent, this and other skin areas extremely pruritic. Other skin rash is worst now in inguinal areas bilaterally, despite no elastic in clothing there now. She has some confluent rash on hands from petting dog. She is using Kenalog ointment bid to all involved areas, which husband also puts onto her back, and the crisco, with vistaril for itching. She has not had systemic steroids for skin since ~ late Jan. She does not have scheduled follow up with dermatology, however that office is glad to see her at any time if needed (thank you!) Otherwise since nivolumab she has had no increased SOB or cough, no diarrhea, no polyuria/ polydypisia, and energy and appetite have been good. She has no discomfort other than the skin rash on perineum.    PAC in Flu vaccine done  ONCOLOGIC HISTORY Patient presented with gradually  enlarging mass at right vulva for ~ 8 months, uncomfortable with direct pressure and ulcerated with occasional bleeding. She was seen in ED on 02-02-14 with right inguinal adenopathy and right vulvar masses, by Dr Elonda Husky on 02-03-14, then by Dr Skeet Latch on 02-05-14, with biopsy of both vulvar mass and satellite lesions on inner thigh and mons. Exam found 7 cm right vulvar mass with 4 cm extension to distal vagina, extending anteriorly to urethral meatus and in ischiorectal area, with satellite lesions inner thigh and right mons. Pathology [id number]) showed invasive squamous cell carcinoma arising in background of high grade squamous intraepithelial lesion from vulva biopsy and invasive moderately differentiated squamous cell carcinoma from inner thigh lesion. CXR 02-06-14 showed left lower lobe lung mass 3.6 x 3.6 x 3.6 cm, with no adenopathy and no bony lesions, emphysematous changes. CT chest 02-09-14 showed the lower lobe mass 3.7 x 3.5 x 3.3 cm as well as 3 mm RLL nodule and faint 4 mm LUL nodule, no mediastinal or hilar nodes, no axillary or supraclavicular nodes, and upper abdomen not remarkable. PET 02-18-14 measured LLL lung mass 4.6 x 3.9 cm, hypermetabolic, and noted multiple subcentimeter nodules scattered thruout lungs bilaterally; the right vulvar mass was also hypermetabolic, extending up into right side of lower vagina, extensive R>L and right external iliac adenopathy, no uptake in skeleton. 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 She had 3 stereotactic radiation treatments to the LLL pulmonary mass 54 Gy in 3 fractions 10-16, 10-19 and 03-04-14. She received IMRT to vulvar area from 03-09-14, completed 04-23-14,  with weekly CDDP x7 03-09-14 thru 04-20-14. Radiation to vulva, vaginal, and pelvic/inguinal nodes was 60 Gy in 30 fractions to gross disease, 54 Gy in 30 fractions to high risk areas, 50.4Gy in 30 fractions to intermediate risk  areas. She had vulvar, vaginal, and nodal boost of 6 Gy in 3 fractions to gross disease. Follow up CT chest 05-04-14 had cavitary, spiculated LLL mass 3.3 x 2.3 cm, having been 4.6 x 3.9 cm prior to IMRT, and no change in scattered small bilateral pulmonary nodules, however PET 05-27-14 had progression in bilateral pulmonary nodules and partial response in local vulvar involvement. 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  CT CAP 08-10-14 had majority of lung nodules stable with a few slightly increased, 2 mm hepatic lucencies possibly cysts, no adenopathy AP. She had first nivolumab 09-14-14, dose reduced by 50% due to concerns for skin toxicity.      Review of systems as above, also: No fever or symptoms of infection. No cough, no chest pain. No problems with PAC.  Remainder of 10 point Review of Systems negative.  Objective:  Vital signs in last 24 hours:  BP 127/66 mmHg  Pulse 77  Temp(Src) 98.2 F (36.8 C) (Oral)  Resp 18  Ht _0  (1.549 m)  Wt 127 lb (57.607 kg)  BMI 24.01 kg/m2  SpO2 100% Weight up 1 lb Alert, oriented and appropriate. Easily ambulatory. Rubbing back against chair due to pruritis. Respirations not labored RA. Partial alopecia  HEENT:PERRL, sclerae not icteric. Oral mucosa moist without lesions, posterior pharynx clear.  Neck supple. No JVD.  Lymphatics:no cervical,supraclavicular adenopathy Resp: somewhat diminished BS thruout as is baseline, otherwise clear to auscultation bilaterally and normal percussion bilaterally Cardio: regular rate and rhythm. No gallop. GI: soft, nontender, not distended, no mass or organomegaly. Normally active bowel sounds.  Musculoskeletal/ Extremities: without pitting edema, cords, tenderness Neuro: less peripheral neuropathy. Otherwise nonfocal. PSYCH: remarkably good mood and affect Skin no rash on face. Scattered erythematous papular lesions across back, with some dryness  and minimal diffuse erythema. A few scattered papular erythematous lesions across lower abdomen, skin not erythematous. Scattered similar rash forearms, with slight pustules on some, no confluent rash except fingers consistent with contact. Scattered similar rash lower legs with some pustules. Mons and inguinal areas bilaterally with confluent erythematous skin changes as she has had more diffusely in past.        No ecchymosis, petechiae Portacath-without erythema or tenderness  Lab Results:  Results for orders placed or performed in visit on 10/02/14  CBC with Differential  Result Value Ref Range   WBC 6.5 3.9 - 10.3 10e3/uL   NEUT# 4.2 1.5 - 6.5 10e3/uL   HGB 11.1 (L) 11.6 - 15.9 g/dL   HCT 34.2 (L) 34.8 - 46.6 %   Platelets 187 145 - 400 10e3/uL   MCV 102.1 (H) 79.5 - 101.0 fL   MCH 33.1 25.1 - 34.0 pg   MCHC 32.5 31.5 - 36.0 g/dL   RBC 3.35 (L) 3.70 - 5.45 10e6/uL   RDW 13.8 11.2 - 14.5 %   lymph# 1.3 0.9 - 3.3 10e3/uL   MONO# 0.7 0.1 - 0.9 10e3/uL   Eosinophils Absolute 0.3 0.0 - 0.5 10e3/uL   Basophils Absolute 0.0 0.0 - 0.1 10e3/uL   NEUT% 65.1 38.4 - 76.8 %   LYMPH% 19.3 14.0 - 49.7 %   MONO% 10.1 0.0 - 14.0 %   EOS% 5.2  0.0 - 7.0 %   BASO% 0.3 0.0 - 2.0 %  Comprehensive metabolic panel (Cmet) - [facility]  Result Value Ref Range   Sodium 139 136 - 145 mEq/L   Potassium 4.2 3.5 - 5.1 mEq/L   Chloride 107 98 - 109 mEq/L   CO2 22 22 - 29 mEq/L   Glucose 102 70 - 140 mg/dl   BUN 22.3 7.0 - 26.0 mg/dL   Creatinine 1.0 0.6 - 1.1 mg/dL   Total Bilirubin 0.33 0.20 - 1.20 mg/dL   Alkaline Phosphatase 100 40 - 150 U/L   AST 18 5 - 34 U/L   ALT 15 0 - 55 U/L   Total Protein 7.1 6.4 - 8.3 g/dL   Albumin 3.6 3.5 - 5.0 g/dL   Calcium 9.8 8.4 - 10.4 mg/dL   Anion Gap 11 3 - 11 mEq/L   EGFR 62 (L) >90 ml/min/1.73 m2    TSH available after visit 4.045, no priors for comparison  Studies/Results:  No results found.  Medications: I have reviewed the patient's current medications.  Nivolumab recommendations are to begin prednisone 1-2 mg/kg/d for grades 3-4 rash. Present rash not that severe, however with her history and increasing symptoms in past 48 hours, will send prescription for prednisone 20 mg three daily with food to her pharmacy in case needs to start over weekend. She knows to call this office if rash worse to point that she feels she needs to start steroids. NOTE we could probably add claritin daily to the prn vistaril for itching, which RN can let her know next week if needed.  DISCUSSION prednisone instructions as above. Patient is to call this office also on 5-23 to let us know how she is with the rash. She is due #2 nivolumab on 10-08-14 if stable, likely again at 50% dosing.   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 Improved local disease with RT/ sensitizing CDDP completed 04-23-14, and additional taxol CDDP thru 07-27-14.  2.Squamous cell carcinoma of LLL lung: long past tobacco; imaging and final path suggests primary lung, tho same histology as vulvar primary. Post SBRT to dominant LLL nodule, which is stable, and bilateral subcentimeter pulmonary nodules majority stable, some slightly increased by CT 08-10-14. Cycle 1 nivolumab 09-24-14, dose reduced due to concerns about skin reaction. New rash since day 7 likely nivolumab related, otherwise TSH elevated and will follow but LFTs/ renal function/ bowels ok. Plan as above. 3.History of eczema since childhood, with severe flares during treatment thus far. Interventions by dermatology helpful. Dr Nevada Crane is aware of our treatment plans and will see patient at any time if needed. Prednisone prescription for nivolumab complications at pharmacy if she needs to begin over this weekend, but will call if so and does not need to start this now.  4.intermittent neuropathy  symptoms lower legs: not typical for chemotherapy but taxol held cycle # 6, improved now.  5.PAC in 6.history migraine HAs. No HA with zofran.  7.allergy PCN  8.BTL  9. Advance Directives done. Patient understands that treatment is not expected to be curative.  10.increased GERD: add protonix daily  11.anemia related to chemo and pelvic RT: improving since completion of those interventions, continue oral iron. Thrombocytopenia resolved. 12.long past tobacco 13.transient speech difficulty with numbness of lip andleft hand early Dec. Neurology involved, no metastatic disease found. On ASA.   All  questions answered. Time spent 30 min including >50% counseling and coordination of care. Cc this note to Dr Nevada Crane in [place] office. We will talk with her by phone early next week and I will try to see her in infusion area on 5-26, or prior if needed. Nivolumab dose kept at 50% for cycle 2, orders confirmed now.   LIVESAY,LENNIS P, MD   10/02/2014, 11:26 AM

## 2014-10-04 ENCOUNTER — Other Ambulatory Visit: Payer: Self-pay | Admitting: Oncology

## 2014-10-04 DIAGNOSIS — C7801 Secondary malignant neoplasm of right lung: Secondary | ICD-10-CM

## 2014-10-04 DIAGNOSIS — C7802 Secondary malignant neoplasm of left lung: Principal | ICD-10-CM

## 2014-10-04 DIAGNOSIS — IMO0002 Reserved for concepts with insufficient information to code with codable children: Secondary | ICD-10-CM

## 2014-10-04 DIAGNOSIS — C799 Secondary malignant neoplasm of unspecified site: Secondary | ICD-10-CM

## 2014-10-05 ENCOUNTER — Telehealth: Payer: Self-pay | Admitting: Oncology

## 2014-10-05 NOTE — Telephone Encounter (Signed)
Per pof 6/2 added and patient will get a new schedule on 5/26

## 2014-10-07 ENCOUNTER — Telehealth: Payer: Self-pay

## 2014-10-07 NOTE — Telephone Encounter (Signed)
-----   Message from Gordy Levan, MD sent at 10/02/2014 11:16 AM EDT ----- 1. Patient is to call us 5-23 to let me know how she is with rash. If still very pruritic, in addition to whatever else we decide, she could add daily claritin if not already on that We sent prescription for prednisone to pharmacy in case she needs to start this weekend  2.I need to see her in infusion area when she is here for rx on 5-26. Does not need to be scheduled as apt if no spots, but I need to see her there briefly

## 2014-10-07 NOTE — Telephone Encounter (Signed)
Left message for Ms. Renegar to call back with an up date on her rash as noted below by Dr. Marko Plume. Stated that Dr. Marko Plume wanted to see her in the infusion room with treatment on 10-08-14 at 0830.

## 2014-10-08 ENCOUNTER — Other Ambulatory Visit (HOSPITAL_BASED_OUTPATIENT_CLINIC_OR_DEPARTMENT_OTHER): Payer: 59

## 2014-10-08 ENCOUNTER — Ambulatory Visit (HOSPITAL_BASED_OUTPATIENT_CLINIC_OR_DEPARTMENT_OTHER): Payer: 59 | Admitting: Oncology

## 2014-10-08 ENCOUNTER — Encounter: Payer: Self-pay | Admitting: Oncology

## 2014-10-08 ENCOUNTER — Other Ambulatory Visit: Payer: Self-pay | Admitting: Oncology

## 2014-10-08 ENCOUNTER — Other Ambulatory Visit: Payer: Self-pay | Admitting: *Deleted

## 2014-10-08 ENCOUNTER — Ambulatory Visit (HOSPITAL_BASED_OUTPATIENT_CLINIC_OR_DEPARTMENT_OTHER): Payer: 59

## 2014-10-08 VITALS — BP 131/67 | HR 65 | Temp 97.8°F | Resp 18

## 2014-10-08 VITALS — BP 131/67 | HR 65

## 2014-10-08 DIAGNOSIS — Z5112 Encounter for antineoplastic immunotherapy: Secondary | ICD-10-CM | POA: Diagnosis not present

## 2014-10-08 DIAGNOSIS — IMO0002 Reserved for concepts with insufficient information to code with codable children: Secondary | ICD-10-CM

## 2014-10-08 DIAGNOSIS — C778 Secondary and unspecified malignant neoplasm of lymph nodes of multiple regions: Secondary | ICD-10-CM

## 2014-10-08 DIAGNOSIS — D6481 Anemia due to antineoplastic chemotherapy: Secondary | ICD-10-CM

## 2014-10-08 DIAGNOSIS — C799 Secondary malignant neoplasm of unspecified site: Secondary | ICD-10-CM

## 2014-10-08 DIAGNOSIS — C3432 Malignant neoplasm of lower lobe, left bronchus or lung: Secondary | ICD-10-CM

## 2014-10-08 DIAGNOSIS — C792 Secondary malignant neoplasm of skin: Secondary | ICD-10-CM

## 2014-10-08 DIAGNOSIS — C549 Malignant neoplasm of corpus uteri, unspecified: Secondary | ICD-10-CM | POA: Diagnosis not present

## 2014-10-08 DIAGNOSIS — T451X5A Adverse effect of antineoplastic and immunosuppressive drugs, initial encounter: Principal | ICD-10-CM

## 2014-10-08 DIAGNOSIS — L309 Dermatitis, unspecified: Secondary | ICD-10-CM

## 2014-10-08 DIAGNOSIS — Z87891 Personal history of nicotine dependence: Secondary | ICD-10-CM

## 2014-10-08 DIAGNOSIS — G629 Polyneuropathy, unspecified: Secondary | ICD-10-CM

## 2014-10-08 DIAGNOSIS — Z79899 Other long term (current) drug therapy: Secondary | ICD-10-CM

## 2014-10-08 DIAGNOSIS — C3492 Malignant neoplasm of unspecified part of left bronchus or lung: Secondary | ICD-10-CM

## 2014-10-08 DIAGNOSIS — C519 Malignant neoplasm of vulva, unspecified: Secondary | ICD-10-CM

## 2014-10-08 DIAGNOSIS — K219 Gastro-esophageal reflux disease without esophagitis: Secondary | ICD-10-CM

## 2014-10-08 DIAGNOSIS — C7801 Secondary malignant neoplasm of right lung: Secondary | ICD-10-CM

## 2014-10-08 DIAGNOSIS — C7802 Secondary malignant neoplasm of left lung: Secondary | ICD-10-CM

## 2014-10-08 LAB — COMPREHENSIVE METABOLIC PANEL (CC13)
ALK PHOS: 95 U/L (ref 40–150)
ALT: 11 U/L (ref 0–55)
AST: 14 U/L (ref 5–34)
Albumin: 3.7 g/dL (ref 3.5–5.0)
Anion Gap: 13 mEq/L — ABNORMAL HIGH (ref 3–11)
BUN: 14.1 mg/dL (ref 7.0–26.0)
CO2: 22 mEq/L (ref 22–29)
Calcium: 10 mg/dL (ref 8.4–10.4)
Chloride: 106 mEq/L (ref 98–109)
Creatinine: 0.9 mg/dL (ref 0.6–1.1)
EGFR: 73 mL/min/{1.73_m2} — ABNORMAL LOW (ref >90)
GLUCOSE: 107 mg/dL (ref 70–140)
POTASSIUM: 4 meq/L (ref 3.5–5.1)
Sodium: 142 mEq/L (ref 136–145)
TOTAL PROTEIN: 7.2 g/dL (ref 6.4–8.3)
Total Bilirubin: 0.24 mg/dL (ref 0.20–1.20)

## 2014-10-08 LAB — CBC WITH DIFFERENTIAL/PLATELET
BASO%: 0.2 % (ref 0.0–2.0)
Basophils Absolute: 0 10*3/uL (ref 0.0–0.1)
EOS ABS: 0 10*3/uL (ref 0.0–0.5)
EOS%: 0.3 % (ref 0.0–7.0)
HCT: 32.5 % — ABNORMAL LOW (ref 34.8–46.6)
HEMOGLOBIN: 10.8 g/dL — AB (ref 11.6–15.9)
LYMPH#: 1.1 10*3/uL (ref 0.9–3.3)
LYMPH%: 13.3 % — ABNORMAL LOW (ref 14.0–49.7)
MCH: 33.3 pg (ref 25.1–34.0)
MCHC: 33.3 g/dL (ref 31.5–36.0)
MCV: 99.9 fL (ref 79.5–101.0)
MONO#: 0.7 10*3/uL (ref 0.1–0.9)
MONO%: 8.5 % (ref 0.0–14.0)
NEUT#: 6.3 10*3/uL (ref 1.5–6.5)
NEUT%: 77.7 % — ABNORMAL HIGH (ref 38.4–76.8)
PLATELETS: 250 10*3/uL (ref 145–400)
RBC: 3.26 10*6/uL — AB (ref 3.70–5.45)
RDW: 13.4 % (ref 11.2–14.5)
WBC: 8.1 10*3/uL (ref 3.9–10.3)

## 2014-10-08 LAB — TSH CHCC: TSH: 1.457 m(IU)/L (ref 0.308–3.960)

## 2014-10-08 MED ORDER — SILVER SULFADIAZINE 1 % EX CREA: 1.0000 "application " | TOPICAL_CREAM | Freq: Every day | CUTANEOUS | Status: DC

## 2014-10-08 MED ORDER — HEPARIN SOD (PORK) LOCK FLUSH 100 UNIT/ML IV SOLN
500.0000 [IU] | Freq: Once | INTRAVENOUS | Status: AC | PRN
  Administered 2014-10-08: 500 [IU]
  Filled 2014-10-08: qty 5

## 2014-10-08 MED ORDER — SODIUM CHLORIDE 0.9 % IV SOLN
Freq: Once | INTRAVENOUS | Status: AC
  Administered 2014-10-08: 09:00:00 via INTRAVENOUS

## 2014-10-08 MED ORDER — SODIUM CHLORIDE 0.9 % IV SOLN
1.5000 mg/kg | Freq: Once | INTRAVENOUS | Status: AC
  Administered 2014-10-08: 90 mg via INTRAVENOUS
  Filled 2014-10-08: qty 9

## 2014-10-08 MED ORDER — SODIUM CHLORIDE 0.9 % IJ SOLN
10.0000 mL | INTRAMUSCULAR | Status: DC | PRN
  Administered 2014-10-08: 10 mL
  Filled 2014-10-08: qty 10

## 2014-10-08 NOTE — Patient Instructions (Signed)
[facility] Discharge Instructions for Patients Receiving Chemotherapy  Today you received the following chemotherapy agents Nivolumab.  To help prevent nausea and vomiting after your treatment, we encourage you to take your nausea medication as directed.    If you develop nausea and vomiting that is not controlled by your nausea medication, call the clinic.   BELOW ARE SYMPTOMS THAT SHOULD BE REPORTED IMMEDIATELY:  *FEVER GREATER THAN 100.5 F  *CHILLS WITH OR WITHOUT FEVER  NAUSEA AND VOMITING THAT IS NOT CONTROLLED WITH YOUR NAUSEA MEDICATION  *UNUSUAL SHORTNESS OF BREATH  *UNUSUAL BRUISING OR BLEEDING  TENDERNESS IN MOUTH AND THROAT WITH OR WITHOUT PRESENCE OF ULCERS  *URINARY PROBLEMS  *BOWEL PROBLEMS  UNUSUAL RASH Items with * indicate a potential emergency and should be followed up as soon as possible.  Feel free to call the clinic you have any questions or concerns. The clinic phone number is [phone].  Please show the CHEMO ALERT CARD at check-in to the Emergency Department and triage nurse.  Nivolumab injection What is this medicine? NIVOLUMAB (nye VOL ue mab) is used to treat certain types of melanoma and lung cancer. This medicine may be used for other purposes; ask your health care provider or pharmacist if you have questions. COMMON BRAND NAME(S): Opdivo What should I tell my health care provider before I take this medicine? They need to know if you have any of these conditions: -eye disease, vision problems -history of pancreatitis -immune system problems -inflammatory bowel disease -kidney disease -liver disease -lung disease -lupus -myasthenia gravis -multiple sclerosis -organ transplant -stomach or intestine problems -thyroid disease -tingling of the fingers or toes, or other nerve disorder -an unusual or allergic reaction to nivolumab, other medicines, foods, dyes, or preservatives -pregnant or trying to get  pregnant -breast-feeding How should I use this medicine? This medicine is for infusion into a vein. It is given by a health care professional in a hospital or clinic setting. A special MedGuide will be given to you before each treatment. Be sure to read this information carefully each time. Talk to your pediatrician regarding the use of this medicine in children. Special care may be needed. Overdosage: If you think you've taken too much of this medicine contact a poison control center or emergency room at once. Overdosage: If you think you have taken too much of this medicine contact a poison control center or emergency room at once. NOTE: This medicine is only for you. Do not share this medicine with others. What if I miss a dose? It is important not to miss your dose. Call your doctor or health care professional if you are unable to keep an appointment. What may interact with this medicine? Interactions have not been studied. This list may not describe all possible interactions. Give your health care provider a list of all the medicines, herbs, non-prescription drugs, or dietary supplements you use. Also tell them if you smoke, drink alcohol, or use illegal drugs. Some items may interact with your medicine. What should I watch for while using this medicine? Tell your doctor or healthcare professional if your symptoms do not start to get better or if they get worse. Your condition will be monitored carefully while you are receiving this medicine. You may need blood work done while you are taking this medicine. What side effects may I notice from receiving this medicine? Side effects that you should report to your doctor or health care professional as soon as possible: -allergic reactions like   skin rash, itching or hives, swelling of the face, lips, or tongue -black, tarry stools -bloody or watery diarrhea -changes in vision -chills -cough -depressed mood -eye pain -feeling  anxious -fever -general ill feeling or flu-like symptoms -hair loss -loss of appetite -low blood counts - this medicine may decrease the number of white blood cells, red blood cells and platelets. You may be at increased risk for infections and bleeding -pain, tingling, numbness in the hands or feet -redness, blistering, peeling or loosening of the skin, including inside the mouth -red pinpoint spots on skin -signs of decreased platelets or bleeding - bruising, pinpoint red spots on the skin, black, tarry stools, blood in the urine -signs of decreased red blood cells - unusually weak or tired, feeling faint or lightheaded, falls -signs of infection - fever or chills, cough, sore throat, pain or trouble passing urine -signs and symptoms of a dangerous change in heartbeat or heart rhythm like chest pain; dizziness; fast or irregular heartbeat; palpitations; feeling faint or lightheaded, falls; breathing problems -signs and symptoms of high blood sugar such as dizziness; dry mouth; dry skin; fruity breath; nausea; stomach pain; increased hunger or thirst; increased urination -signs and symptoms of kidney injury like trouble passing urine or change in the amount of urine -signs and symptoms of liver injury like dark yellow or brown urine; general ill feeling or flu-like symptoms; light-colored stools; loss of appetite; nausea; right upper belly pain; unusually weak or tired; yellowing of the eyes or skin -signs and symptoms of increased potassium like muscle weakness; chest pain; or fast, irregular heartbeat -signs and symptoms of low potassium like muscle cramps or muscle pain; chest pain; dizziness; feeling faint or lightheaded, falls; palpitations; breathing problems; or fast, irregular heartbeat -swelling of the ankles, feet, hands -weight gainSide effects that usually do not require medical attention (report to your doctor or health care professional if they continue or are  bothersome): -constipation -general ill feeling or flu-like symptoms -hair loss -loss of appetite -nausea, vomiting This list may not describe all possible side effects. Call your doctor for medical advice about side effects. You may report side effects to FDA at 1-800-FDA-1088. Where should I keep my medicine? This drug is given in a hospital or clinic and will not be stored at home. NOTE: This sheet is a summary. It may not cover all possible information. If you have questions about this medicine, talk to your doctor, pharmacist, or health care provider.  2015, [employer]. (2013-07-21 13:18:19)  

## 2014-10-08 NOTE — Progress Notes (Signed)
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 Despite preexisting severe eczema/ dermatitis, she had no significant dermatitis related to nivolumab with cycle 1. Doses have been reduced for this cycle as well as previous cycle.  Patient has felt well in past week. Skin is much better other than right inguinal area, still using kenalog 0.1% and crisco topical per Dr [place] Crane. She did not need to begin oral steroids. She has gone about regular activities and denies increased SOB, chest pain, or cough. Appetite is good without other GI symptoms. She has no significant vulvar discomfort and no bleeding.   PAC in  [place] Patient presented with gradually enlarging mass at right vulva for ~ 8 months, uncomfortable with direct pressure and ulcerated with occasional bleeding. She was seen in ED on 02-02-14 with right inguinal adenopathy and right vulvar masses, by Dr Elonda Husky on 02-03-14, then by Dr Skeet Latch on 02-05-14, with biopsy of both vulvar mass and satellite lesions on inner thigh and mons. Exam found 7 cm right vulvar mass with 4 cm extension to distal vagina, extending anteriorly to urethral meatus and in ischiorectal area, with satellite lesions inner thigh and right mons. Pathology [phone]) showed invasive squamous cell carcinoma arising in background of high grade squamous intraepithelial lesion from vulva biopsy and invasive moderately differentiated squamous cell carcinoma from inner thigh lesion. CXR 02-06-14 showed left lower lobe lung mass 3.6 x 3.6 x 3.6 cm, with no adenopathy and no bony lesions, emphysematous changes. CT chest  02-09-14 showed the lower lobe mass 3.7 x 3.5 x 3.3 cm as well as 3 mm RLL nodule and faint 4 mm LUL nodule, no mediastinal or hilar nodes, no axillary or supraclavicular nodes, and upper abdomen not remarkable. PET 02-18-14 measured LLL lung mass 4.6 x 3.9 cm, hypermetabolic, and noted multiple subcentimeter nodules scattered thruout lungs bilaterally; the right vulvar mass was also hypermetabolic, extending up into right side of lower vagina, extensive R>L and right external iliac adenopathy, no uptake in skeleton. 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 She had 3 stereotactic radiation treatments to the LLL pulmonary mass 54 Gy in 3 fractions 10-16, 10-19 and 03-04-14. She received IMRT to vulvar area from 03-09-14, completed 04-23-14, with weekly CDDP x7 03-09-14 thru 04-20-14. Radiation to vulva, vaginal, and pelvic/inguinal nodes was 60 Gy in 30 fractions to gross disease, 54 Gy in 30 fractions to high risk areas, 50.4Gy in 30 fractions to intermediate risk areas. She had vulvar, vaginal, and nodal boost of 6 Gy in 3 fractions to gross disease. Follow up CT chest 05-04-14 had cavitary, spiculated LLL mass 3.3 x 2.3 cm, having been 4.6 x 3.9 cm prior to IMRT, and no change in scattered small bilateral pulmonary nodules, however PET 05-27-14 had progression in bilateral pulmonary nodules and partial response in local vulvar involvement. 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 CT CAP 08-10-14 had majority of lung nodules stable with a few slightly increased, 2 mm hepatic lucencies possibly cysts, no adenopathy AP. She had first nivolumab 09-14-14, dose reduced by 50% due to concerns  for skin toxicity.     Review of systems as above, also: No fever or symptoms of infection. No bladder symptoms. Energy very good. Remainder of 10 point Review of Systems negative.  Objective:  Vital signs  in last 24 hours:  BP 131/67 mmHg  Pulse 65 resp 18 not labored RA, temp 97.5mO2 sat 100%  Alert, oriented and appropriate, very pleasant as always. Ambulated into office without difficulty, comfortable in recliner in infsion now   HEENT:PERRL, sclerae not icteric. Oral mucosa moist without lesions.  Neck supple. No JVD.  Lymphatics:no cervical,supraclavicular adenopathy Resp: clear to auscultation bilaterally  Cardio: regular rate and rhythm. No gallop. GI: soft, nontender, not distended. Normally active bowel sounds.  Musculoskeletal/ Extremities: without pitting edema, cords, tenderness Neuro: nonfocal  PSYCH appropriate mood and affect Skin without ecchymosis, petechiae. Minimal erythematous rash dorsum of hands and digits, none of the papular rash on forearms. RIght inguinal area with confluent erythema, no desquamation, no apparent yeast. Dry, nearly resolved lesions lower legs.  Portacath-without erythema or tenderness  Lab Results:  Results for orders placed or performed in visit on 10/08/14  CBC with Differential  Result Value Ref Range   WBC 8.1 3.9 - 10.3 10e3/uL   NEUT# 6.3 1.5 - 6.5 10e3/uL   HGB 10.8 (L) 11.6 - 15.9 g/dL   HCT 32.5 (L) 34.8 - 46.6 %   Platelets 250 145 - 400 10e3/uL   MCV 99.9 79.5 - 101.0 fL   MCH 33.3 25.1 - 34.0 pg   MCHC 33.3 31.5 - 36.0 g/dL   RBC 3.26 (L) 3.70 - 5.45 10e6/uL   RDW 13.4 11.2 - 14.5 %   lymph# 1.1 0.9 - 3.3 10e3/uL   MONO# 0.7 0.1 - 0.9 10e3/uL   Eosinophils Absolute 0.0 0.0 - 0.5 10e3/uL   Basophils Absolute 0.0 0.0 - 0.1 10e3/uL   NEUT% 77.7 (H) 38.4 - 76.8 %   LYMPH% 13.3 (L) 14.0 - 49.7 %   MONO% 8.5 0.0 - 14.0 %   EOS% 0.3 0.0 - 7.0 %   BASO% 0.2 0.0 - 2.0 %  Comprehensive metabolic panel (Cmet) - [facility]  Result Value Ref Range   Sodium 142 136 - 145 mEq/L   Potassium 4.0 3.5 - 5.1 mEq/L   Chloride 106 98 - 109 mEq/L   CO2 22 22 - 29 mEq/L   Glucose 107 70 - 140 mg/dl   BUN 14.1 7.0 - 26.0 mg/dL   Creatinine  0.9 0.6 - 1.1 mg/dL   Total Bilirubin 0.24 0.20 - 1.20 mg/dL   Alkaline Phosphatase 95 40 - 150 U/L   AST 14 5 - 34 U/L   ALT 11 0 - 55 U/L   Total Protein 7.2 6.4 - 8.3 g/dL   Albumin 3.7 3.5 - 5.0 g/dL   Calcium 10.0 8.4 - 10.4 mg/dL   Anion Gap 13 (H) 3 - 11 mEq/L   EGFR 73 (L) >90 ml/min/1.73 m2  TSH-Thyroid Stimulating Hormone  Result Value Ref Range   TSH 1.457 0.308 - 3.960 m(IU)/L     Studies/Results:  No results found.  Medications: I have reviewed the patient's current medications. Silvadene mixed with viscous lidocaine.  DISCUSSION: Inguinal area may be more sensitive from radiation field; she did not discuss this area with Dr HNevada Crane She will try the silvadene mixed with lidocaine jelly that was helpful during radiation, but should speak with Dr HNevada Craneif that does not help.   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 Improved local disease with RT/ sensitizing CDDP completed 04-23-14, and additional taxol CDDP thru 07-27-14.  2.Squamous cell carcinoma of LLL lung: long past tobacco; imaging and final path suggests primary lung, tho same histology as vulvar primary. Post SBRT to dominant LLL nodule, which is stable, and bilateral subcentimeter pulmonary nodules majority stable, some slightly increased by CT 08-10-14. Cycle 1 nivolumab 09-24-14, dose reduced due to concerns about skin reaction and tolerated well. Cycle 2 today, again dose reduced; labs noted as above. I will see her back on 10-15-14 with labs.  3.History of eczema since childhood, with severe flares during treatment thus far. Interventions by dermatology helpful, Dr Juel Burrow care much appreciated.   4.intermittent neuropathy symptoms lower legs: not typical for chemotherapy but taxol held cycle # 6, improved now.  5.PAC in 6.history migraine HAs. No HA with zofran.   7.allergy PCN  8.BTL  9. Advance Directives done. Patient understands that treatment is not expected to be curative.  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 Thrombocytopenia resolved. 12.long past tobacco 13.transient speech difficulty with numbness of lip andleft hand early Dec. Neurology involved, no metastatic disease found. On ASA.   Patient is in agreement with all of plans and knows to call if needed prior to visit next week. Discussed with infusion RN.    Ihor Meinzer P, MD   10/08/2014, 12:02 PM

## 2014-10-14 ENCOUNTER — Other Ambulatory Visit: Payer: Self-pay | Admitting: Oncology

## 2014-10-15 ENCOUNTER — Telehealth: Payer: Self-pay | Admitting: Oncology

## 2014-10-15 ENCOUNTER — Ambulatory Visit (HOSPITAL_BASED_OUTPATIENT_CLINIC_OR_DEPARTMENT_OTHER): Payer: 59 | Admitting: Oncology

## 2014-10-15 ENCOUNTER — Other Ambulatory Visit (HOSPITAL_BASED_OUTPATIENT_CLINIC_OR_DEPARTMENT_OTHER): Payer: 59

## 2014-10-15 ENCOUNTER — Encounter: Payer: Self-pay | Admitting: Oncology

## 2014-10-15 VITALS — BP 132/64 | HR 74 | Temp 97.8°F | Resp 18 | Ht 61.0 in | Wt 127.2 lb

## 2014-10-15 DIAGNOSIS — IMO0002 Reserved for concepts with insufficient information to code with codable children: Secondary | ICD-10-CM

## 2014-10-15 DIAGNOSIS — Z87891 Personal history of nicotine dependence: Secondary | ICD-10-CM

## 2014-10-15 DIAGNOSIS — C7801 Secondary malignant neoplasm of right lung: Secondary | ICD-10-CM

## 2014-10-15 DIAGNOSIS — K219 Gastro-esophageal reflux disease without esophagitis: Secondary | ICD-10-CM

## 2014-10-15 DIAGNOSIS — C7802 Secondary malignant neoplasm of left lung: Principal | ICD-10-CM

## 2014-10-15 DIAGNOSIS — C778 Secondary and unspecified malignant neoplasm of lymph nodes of multiple regions: Secondary | ICD-10-CM

## 2014-10-15 DIAGNOSIS — C3432 Malignant neoplasm of lower lobe, left bronchus or lung: Secondary | ICD-10-CM | POA: Diagnosis not present

## 2014-10-15 DIAGNOSIS — C519 Malignant neoplasm of vulva, unspecified: Secondary | ICD-10-CM

## 2014-10-15 DIAGNOSIS — Z95828 Presence of other vascular implants and grafts: Secondary | ICD-10-CM

## 2014-10-15 DIAGNOSIS — C549 Malignant neoplasm of corpus uteri, unspecified: Secondary | ICD-10-CM | POA: Diagnosis not present

## 2014-10-15 DIAGNOSIS — Z79899 Other long term (current) drug therapy: Secondary | ICD-10-CM

## 2014-10-15 DIAGNOSIS — C3492 Malignant neoplasm of unspecified part of left bronchus or lung: Secondary | ICD-10-CM

## 2014-10-15 DIAGNOSIS — C541 Malignant neoplasm of endometrium: Secondary | ICD-10-CM | POA: Diagnosis not present

## 2014-10-15 DIAGNOSIS — C792 Secondary malignant neoplasm of skin: Secondary | ICD-10-CM | POA: Diagnosis not present

## 2014-10-15 DIAGNOSIS — L270 Generalized skin eruption due to drugs and medicaments taken internally: Secondary | ICD-10-CM

## 2014-10-15 DIAGNOSIS — G629 Polyneuropathy, unspecified: Secondary | ICD-10-CM

## 2014-10-15 DIAGNOSIS — L309 Dermatitis, unspecified: Secondary | ICD-10-CM

## 2014-10-15 DIAGNOSIS — C799 Secondary malignant neoplasm of unspecified site: Secondary | ICD-10-CM

## 2014-10-15 DIAGNOSIS — D6481 Anemia due to antineoplastic chemotherapy: Secondary | ICD-10-CM

## 2014-10-15 LAB — CBC WITH DIFFERENTIAL/PLATELET
BASO%: 0.1 % (ref 0.0–2.0)
Basophils Absolute: 0 10*3/uL (ref 0.0–0.1)
EOS%: 1.2 % (ref 0.0–7.0)
Eosinophils Absolute: 0.1 10*3/uL (ref 0.0–0.5)
HEMATOCRIT: 34.2 % — AB (ref 34.8–46.6)
HGB: 11.2 g/dL — ABNORMAL LOW (ref 11.6–15.9)
LYMPH%: 14.7 % (ref 14.0–49.7)
MCH: 32.8 pg (ref 25.1–34.0)
MCHC: 32.7 g/dL (ref 31.5–36.0)
MCV: 100.3 fL (ref 79.5–101.0)
MONO#: 1.1 10*3/uL — AB (ref 0.1–0.9)
MONO%: 13.4 % (ref 0.0–14.0)
NEUT%: 70.6 % (ref 38.4–76.8)
NEUTROS ABS: 5.9 10*3/uL (ref 1.5–6.5)
Platelets: 194 10*3/uL (ref 145–400)
RBC: 3.41 10*6/uL — ABNORMAL LOW (ref 3.70–5.45)
RDW: 12.8 % (ref 11.2–14.5)
WBC: 8.4 10*3/uL (ref 3.9–10.3)
lymph#: 1.2 10*3/uL (ref 0.9–3.3)

## 2014-10-15 LAB — COMPREHENSIVE METABOLIC PANEL (CC13)
ALBUMIN: 3.6 g/dL (ref 3.5–5.0)
ALK PHOS: 92 U/L (ref 40–150)
ALT: 9 U/L (ref 0–55)
AST: 12 U/L (ref 5–34)
Anion Gap: 11 mEq/L (ref 3–11)
BILIRUBIN TOTAL: 0.21 mg/dL (ref 0.20–1.20)
BUN: 19.3 mg/dL (ref 7.0–26.0)
CO2: 27 mEq/L (ref 22–29)
Calcium: 10 mg/dL (ref 8.4–10.4)
Chloride: 105 mEq/L (ref 98–109)
Creatinine: 1 mg/dL (ref 0.6–1.1)
EGFR: 62 mL/min/{1.73_m2} — AB (ref >90)
Glucose: 90 mg/dl (ref 70–140)
POTASSIUM: 4 meq/L (ref 3.5–5.1)
Sodium: 143 mEq/L (ref 136–145)
Total Protein: 7.3 g/dL (ref 6.4–8.3)

## 2014-10-15 NOTE — Progress Notes (Signed)
OFFICE PROGRESS NOTE   October 15, 2014   Physicians:Emma Rossi/ W.Skeet Latch ,[name], Alfredia Ferguson.[place] (PCP)  INTERVAL HISTORY:   Patient is seen, alone for visit, in continuing attention to treatment in process with nivolumab for metastatic squamous cell carcinoma involving lungs, which is either primary lung (or possibly metastatic vulvar). She had cycle 2 nivolumab on 10-08-14, dose reduced as was cycle 1 due to concern for skin toxicity given her preexisting dermatologic problems.   Patient has had progressive pruritis and rash since second nivolumab, which has different feel and appearance to her usual eczema/ dermatitis. The itching was severe to the point that she began prednisone on 10-13-14, 60 mg daily which had been prescribed to have available when nivolumab began. She has had some improvement in symptoms with steroids last 2 days, has not taken these so far today. Pruritis seems to be more significant than the actual rash at this point, entire body. She is using vistaril and other usual topicals from Dr Nevada Crane. Otherwise she does not seem to have complications from nivolumab thus far. She denies SOB or cough, has no diarrhea, no other GI symptoms, no HA or other neurologic symptoms, no excessive thirst.    PAC in  ONCOLOGIC HISTORY Patient presented with gradually enlarging mass at right vulva for ~ 8 months, uncomfortable with direct pressure and ulcerated with occasional bleeding. She was seen in ED on 02-02-14 with right inguinal adenopathy and right vulvar masses, by Dr Elonda Husky on 02-03-14, then by Dr Skeet Latch on 02-05-14, with biopsy of both vulvar mass and satellite lesions on inner thigh and mons. Exam found 7 cm right vulvar mass with 4 cm extension to distal vagina, extending anteriorly to urethral meatus and in ischiorectal area, with satellite lesions inner thigh and right mons. Pathology [id number]) showed invasive squamous cell carcinoma  arising in background of high grade squamous intraepithelial lesion from vulva biopsy and invasive moderately differentiated squamous cell carcinoma from inner thigh lesion. CXR 02-06-14 showed left lower lobe lung mass 3.6 x 3.6 x 3.6 cm, with no adenopathy and no bony lesions, emphysematous changes. CT chest 02-09-14 showed the lower lobe mass 3.7 x 3.5 x 3.3 cm as well as 3 mm RLL nodule and faint 4 mm LUL nodule, no mediastinal or hilar nodes, no axillary or supraclavicular nodes, and upper abdomen not remarkable. PET 02-18-14 measured LLL lung mass 4.6 x 3.9 cm, hypermetabolic, and noted multiple subcentimeter nodules scattered thruout lungs bilaterally; the right vulvar mass was also hypermetabolic, extending up into right side of lower vagina, extensive R>L and right external iliac adenopathy, no uptake in skeleton. 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 She had 3 stereotactic radiation treatments to the LLL pulmonary mass 54 Gy in 3 fractions 10-16, 10-19 and 03-04-14. She received IMRT to vulvar area from 03-09-14, completed 04-23-14, with weekly CDDP x7 03-09-14 thru 04-20-14. Radiation to vulva, vaginal, and pelvic/inguinal nodes was 60 Gy in 30 fractions to gross disease, 54 Gy in 30 fractions to high risk areas, 50.4Gy in 30 fractions to intermediate risk areas. She had vulvar, vaginal, and nodal boost of 6 Gy in 3 fractions to gross disease. Follow up CT chest 05-04-14 had cavitary, spiculated LLL mass 3.3 x 2.3 cm, having been 4.6 x 3.9 cm prior to IMRT, and no change in scattered small bilateral pulmonary nodules, however PET 05-27-14 had progression in bilateral pulmonary nodules and partial response in  local vulvar involvement. 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 CT CAP 08-10-14 had majority of lung nodules stable with a few slightly increased, 2 mm hepatic lucencies  possibly cysts, no adenopathy AP. She had first nivolumab 09-14-14, dose reduced by 50% due to concerns for skin toxicity.    Review of systems as above, also: Skin irritation at right inguinal area better this week since not using gauze there. No pain now at vulva. Remainder of 10 point Review of Systems negative.  Objective:  Vital signs in last 24 hours:  BP 132/64 mmHg  Pulse 74  Temp(Src) 97.8 F (36.6 C) (Oral)  Resp 18  Ht 5' 1"  (1.549 m)  Wt 127 lb 3.2 oz (57.698 kg)  BMI 24.05 kg/m2  SpO2 100% Weight stable Alert, oriented and appropriate. Ambulatory without assistance. Looks mildly uncomfortable with pruritis.  HEENT:PERRL, sclerae not icteric. Oral mucosa moist without lesions, posterior pharynx clear.  Neck supple. No JVD.  Lymphatics:no cervical,supraclavicular adenopathy Resp: somewhat diminished BS thruout as is baseline, otherwise clear to auscultation bilaterally and no dullness to percussion bilaterally Cardio: regular rate and rhythm. No gallop. GI: soft, nontender, not distended, no mass or organomegaly. Normally active bowel sounds.  Musculoskeletal/ Extremities: without pitting edema, cords, tenderness Neuro: no peripheral neuropathy. Otherwise nonfocal Skin without ecchymosis, petechiae. Some eczema type rash on hands and forearms. Newer rash is palpable, mildly erythematous, extensively involving all of back and also on upper arms and lower legs.  Portacath-without erythema or tenderness, no skin lesions there.  Lab Results:  Results for orders placed or performed in visit on 10/15/14  CBC with Differential  Result Value Ref Range   WBC 8.4 3.9 - 10.3 10e3/uL   NEUT# 5.9 1.5 - 6.5 10e3/uL   HGB 11.2 (L) 11.6 - 15.9 g/dL   HCT 34.2 (L) 34.8 - 46.6 %   Platelets 194 145 - 400 10e3/uL   MCV 100.3 79.5 - 101.0 fL   MCH 32.8 25.1 - 34.0 pg   MCHC 32.7 31.5 - 36.0 g/dL   RBC 3.41 (L) 3.70 - 5.45 10e6/uL   RDW 12.8 11.2 - 14.5 %   lymph# 1.2 0.9 - 3.3  10e3/uL   MONO# 1.1 (H) 0.1 - 0.9 10e3/uL   Eosinophils Absolute 0.1 0.0 - 0.5 10e3/uL   Basophils Absolute 0.0 0.0 - 0.1 10e3/uL   NEUT% 70.6 38.4 - 76.8 %   LYMPH% 14.7 14.0 - 49.7 %   MONO% 13.4 0.0 - 14.0 %   EOS% 1.2 0.0 - 7.0 %   BASO% 0.1 0.0 - 2.0 %  Comprehensive metabolic panel (Cmet) - [facility]  Result Value Ref Range   Sodium 143 136 - 145 mEq/L   Potassium 4.0 3.5 - 5.1 mEq/L   Chloride 105 98 - 109 mEq/L   CO2 27 22 - 29 mEq/L   Glucose 90 70 - 140 mg/dl   BUN 19.3 7.0 - 26.0 mg/dL   Creatinine 1.0 0.6 - 1.1 mg/dL   Total Bilirubin 0.21 0.20 - 1.20 mg/dL   Alkaline Phosphatase 92 40 - 150 U/L   AST 12 5 - 34 U/L   ALT 9 0 - 55 U/L   Total Protein 7.3 6.4 - 8.3 g/dL   Albumin 3.6 3.5 - 5.0 g/dL   Calcium 10.0 8.4 - 10.4 mg/dL   Anion Gap 11 3 - 11 mEq/L   EGFR 62 (L) >90 ml/min/1.73 m2  Studies/Results:  No results found. CXR will be done prior to my next visit. Imaging has been CT most recently  Medications: I have reviewed the patient's current medications. She will continue prednisone 60 mg daily with food until she sees Dr Nevada Crane next week, should have 10 day supply (tho pharmacy may not have dispensed all of that)  DISCUSSION: Prednisone as above, which is primary intervention for nivolumab side effects. She will let me know how she is ~ 10-22-14 or sooner if needed, to decide about continuing steroids then, as may need to taper off. Will hold nivolumab due 10-22-14. WIll need to decide when I see her the following week if appropriate to continue this drug or, more likely, need to change therapy.   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 Improved local disease with RT/ sensitizing CDDP completed 04-23-14, and additional taxol CDDP thru 07-27-14.  2.Squamous cell carcinoma of LLL lung: long past tobacco; imaging  and final path from biopsy suggests primary lung, tho same histology as vulvar primary. Post SBRT to dominant LLL nodule, which is stable, and bilateral subcentimeter pulmonary nodules majority stable, some slightly increased by CT 08-10-14. Cycles 1 and 2 nivolumab given 5-12 and 10-08-14 at 50% dosing, still complicated now by severe pruritis and increased rash. Prednisone as above, likely will need some taper when we hear back from her next week. May not be appropriate to continue nivolumab depending on stauts next 2 weeks. Appreciate Dr Nevada Crane seeing her also. 3.History of eczema since childhood, with severe flares during treatment thus far. Interventions by dermatology helpful, Dr Juel Burrow care much appreciated.  4.intermittent neuropathy symptoms lower legs: not typical for chemotherapy but taxol held cycle # 6, improved now.  5.PAC in 6.history migraine HAs. No HA with zofran.  7.allergy PCN  8.BTL  9. Advance Directives done. Patient understands that treatment is not expected to be curative.  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 Thrombocytopenia resolved. 12.long past tobacco 13.transient speech difficulty with numbness of lip andleft hand early Dec. Neurology involved, no metastatic disease found. On ASA.  Patient understands discussion and knows to call prior to next scheduled appointment for follow up and also prn. Time spent 25 min including >50% counseling and coordination of care. CC note to Dr Nevada Crane at [facility] office, and PCP   Gordy Levan, MD   10/15/2014, 10:13 AM

## 2014-10-15 NOTE — Patient Instructions (Signed)
Call Dr Marko Plume ~ June 9 to let us know how you are with the rash. Continue prednisone (steroid) 3 tablets = 60 mg every AM with food at least until you see dermatologist. We will decide when you call next week how much and how long to continue that.

## 2014-10-15 NOTE — Telephone Encounter (Signed)
Per 6/2 pof cxd lab/tx for 6/9. Gave patient avs report and new schedule for June. Patient aware cxr to be 6/20 prior to 6/20 lab/LL.

## 2014-10-16 DIAGNOSIS — L270 Generalized skin eruption due to drugs and medicaments taken internally: Secondary | ICD-10-CM | POA: Insufficient documentation

## 2014-10-16 DIAGNOSIS — Z79899 Other long term (current) drug therapy: Secondary | ICD-10-CM | POA: Insufficient documentation

## 2014-10-19 ENCOUNTER — Telehealth: Payer: Self-pay | Admitting: Oncology

## 2014-10-19 NOTE — Telephone Encounter (Signed)
Faxed pt office notes to Dr. Allyn Kenner in [place] [phone]

## 2014-10-22 ENCOUNTER — Other Ambulatory Visit: Payer: 59

## 2014-10-22 ENCOUNTER — Telehealth: Payer: Self-pay

## 2014-10-22 ENCOUNTER — Ambulatory Visit: Payer: 59

## 2014-10-22 NOTE — Telephone Encounter (Signed)
Jody Beard stated that she began the Prednisone 60 mg on 10-16-14.  The rash was gone by Tuesday 10-20-14 so Dr. Juel Burrow PA could not see rash. She finished the prednisone 60 mg today. Told Jody Beard that Dr. Marko Plume said that she does not need anymore prednisone at this time and keep appointment on 11-02-14 as scheduled.  If rash reoccurs call Dr. Mariana Kaufman office. Jody Beard verbalized understanding.

## 2014-11-01 ENCOUNTER — Other Ambulatory Visit: Payer: Self-pay | Admitting: Oncology

## 2014-11-02 ENCOUNTER — Encounter: Payer: Self-pay | Admitting: Oncology

## 2014-11-02 ENCOUNTER — Other Ambulatory Visit (HOSPITAL_BASED_OUTPATIENT_CLINIC_OR_DEPARTMENT_OTHER): Payer: 59

## 2014-11-02 ENCOUNTER — Ambulatory Visit (HOSPITAL_COMMUNITY)
Admission: RE | Admit: 2014-11-02 | Discharge: 2014-11-02 | Disposition: A | Discharge status: Home / Self Care | Payer: 59 | Source: Ambulatory Visit | Attending: Oncology | Admitting: Oncology

## 2014-11-02 ENCOUNTER — Telehealth: Payer: Self-pay | Admitting: *Deleted

## 2014-11-02 ENCOUNTER — Ambulatory Visit (HOSPITAL_BASED_OUTPATIENT_CLINIC_OR_DEPARTMENT_OTHER): Payer: 59 | Admitting: Oncology

## 2014-11-02 ENCOUNTER — Telehealth: Payer: Self-pay | Admitting: Oncology

## 2014-11-02 VITALS — BP 124/74 | HR 72 | Temp 97.9°F | Resp 18 | Ht 61.0 in | Wt 126.7 lb

## 2014-11-02 DIAGNOSIS — C778 Secondary and unspecified malignant neoplasm of lymph nodes of multiple regions: Secondary | ICD-10-CM

## 2014-11-02 DIAGNOSIS — C7802 Secondary malignant neoplasm of left lung: Secondary | ICD-10-CM

## 2014-11-02 DIAGNOSIS — IMO0002 Reserved for concepts with insufficient information to code with codable children: Secondary | ICD-10-CM

## 2014-11-02 DIAGNOSIS — C7801 Secondary malignant neoplasm of right lung: Secondary | ICD-10-CM | POA: Insufficient documentation

## 2014-11-02 DIAGNOSIS — C792 Secondary malignant neoplasm of skin: Secondary | ICD-10-CM

## 2014-11-02 DIAGNOSIS — L270 Generalized skin eruption due to drugs and medicaments taken internally: Secondary | ICD-10-CM

## 2014-11-02 DIAGNOSIS — C541 Malignant neoplasm of endometrium: Secondary | ICD-10-CM

## 2014-11-02 DIAGNOSIS — C3432 Malignant neoplasm of lower lobe, left bronchus or lung: Secondary | ICD-10-CM | POA: Diagnosis not present

## 2014-11-02 DIAGNOSIS — C799 Secondary malignant neoplasm of unspecified site: Secondary | ICD-10-CM

## 2014-11-02 DIAGNOSIS — Z95828 Presence of other vascular implants and grafts: Secondary | ICD-10-CM

## 2014-11-02 DIAGNOSIS — L309 Dermatitis, unspecified: Secondary | ICD-10-CM

## 2014-11-02 DIAGNOSIS — G629 Polyneuropathy, unspecified: Secondary | ICD-10-CM

## 2014-11-02 DIAGNOSIS — Z79899 Other long term (current) drug therapy: Secondary | ICD-10-CM

## 2014-11-02 DIAGNOSIS — C519 Malignant neoplasm of vulva, unspecified: Secondary | ICD-10-CM

## 2014-11-02 DIAGNOSIS — C3492 Malignant neoplasm of unspecified part of left bronchus or lung: Secondary | ICD-10-CM

## 2014-11-02 DIAGNOSIS — K219 Gastro-esophageal reflux disease without esophagitis: Secondary | ICD-10-CM

## 2014-11-02 LAB — CBC WITH DIFFERENTIAL/PLATELET
BASO%: 0.7 % (ref 0.0–2.0)
BASOS ABS: 0 10*3/uL (ref 0.0–0.1)
EOS%: 2.9 % (ref 0.0–7.0)
Eosinophils Absolute: 0.2 10*3/uL (ref 0.0–0.5)
HEMATOCRIT: 36.7 % (ref 34.8–46.6)
HGB: 12.1 g/dL (ref 11.6–15.9)
LYMPH#: 0.8 10*3/uL — AB (ref 0.9–3.3)
LYMPH%: 11.7 % — ABNORMAL LOW (ref 14.0–49.7)
MCH: 31.8 pg (ref 25.1–34.0)
MCHC: 33 g/dL (ref 31.5–36.0)
MCV: 96.3 fL (ref 79.5–101.0)
MONO#: 0.4 10*3/uL (ref 0.1–0.9)
MONO%: 6.2 % (ref 0.0–14.0)
NEUT#: 5.7 10*3/uL (ref 1.5–6.5)
NEUT%: 78.5 % — AB (ref 38.4–76.8)
Platelets: 188 10*3/uL (ref 145–400)
RBC: 3.81 10*6/uL (ref 3.70–5.45)
RDW: 13.1 % (ref 11.2–14.5)
WBC: 7.3 10*3/uL (ref 3.9–10.3)

## 2014-11-02 LAB — COMPREHENSIVE METABOLIC PANEL (CC13)
ALBUMIN: 3.7 g/dL (ref 3.5–5.0)
ALT: 13 U/L (ref 0–55)
ANION GAP: 8 meq/L (ref 3–11)
AST: 13 U/L (ref 5–34)
Alkaline Phosphatase: 92 U/L (ref 40–150)
BUN: 14.3 mg/dL (ref 7.0–26.0)
CO2: 29 mEq/L (ref 22–29)
Calcium: 10.2 mg/dL (ref 8.4–10.4)
Chloride: 103 mEq/L (ref 98–109)
Creatinine: 1 mg/dL (ref 0.6–1.1)
EGFR: 58 mL/min/{1.73_m2} — AB (ref >90)
GLUCOSE: 115 mg/dL (ref 70–140)
POTASSIUM: 4.6 meq/L (ref 3.5–5.1)
SODIUM: 140 meq/L (ref 136–145)
Total Bilirubin: 0.42 mg/dL (ref 0.20–1.20)
Total Protein: 7.2 g/dL (ref 6.4–8.3)

## 2014-11-02 LAB — TSH CHCC: TSH: 2.823 m(IU)/L (ref 0.308–3.960)

## 2014-11-02 MED ORDER — PREDNISONE 20 MG PO TABS: ORAL_TABLET | ORAL | Status: DC

## 2014-11-02 NOTE — Telephone Encounter (Signed)
I have adjusted 7/7 appt 

## 2014-11-02 NOTE — Telephone Encounter (Signed)
Appointments made and avs printed for patient °

## 2014-11-02 NOTE — Progress Notes (Signed)
OFFICE PROGRESS NOTE   November 02, 2014   Physicians:Jody Beard/ Jody Beard ,Jody Beard, Jody Beard (PCP)   INTERVAL HISTORY:  Patient is seen, alone for visit, in continuing attention to metastatic squamous cell carcinoma of vulva and second primary (vs metastatic) squamous cell carcinoma of lung. She has had 2 cycles of Nivolumab on 5-12 and 10-08-14, doses reduced due to concern for skin reactions given her preexisting history of dermatitis. Cycle 3 was delayed due to increased skin reaction after second cycle, tho skin resolved within ~ 6 days of prednisone 60 mg daily. She has had no other side effects from the Nivolumab. CXR prior to visit today shows multiple bilateral pulmonary nodules, comparison being CT chest 08-10-14 and CXR from Oct 2015. She denies any noticeable SOB, any cough, hemoptysis or chest pain.  Patient denies any difficulty stopping the prednisone without taper on 10-22-14. She has had some exacerbation of usual contact dermatitis back and extremities from contact with car upholstry etc. She has no discomfort now on perineum, tho still skin reaction at right inguinal area > left. Appetite is good, energy generally good. No diarrhea, no mucositis, no abdominal pain. Sleeping only ~ 4 hrs at night, but naps during day "too much on my mind".    PAC in  [place] Patient presented with gradually enlarging mass at right vulva for ~ 8 months, uncomfortable with direct pressure and ulcerated with occasional bleeding. She was seen in ED on 02-02-14 with right inguinal adenopathy and right vulvar masses, by Dr Elonda Husky on 02-03-14, then by Dr Skeet Beard on 02-05-14, with biopsy of both vulvar mass and satellite lesions on inner thigh and mons. Exam found 7 cm right vulvar mass with 4 cm extension to distal vagina, extending anteriorly to urethral meatus and in ischiorectal area, with satellite lesions inner thigh and right mons. Pathology  [id number]) showed invasive squamous cell carcinoma arising in background of high grade squamous intraepithelial lesion from vulva biopsy and invasive moderately differentiated squamous cell carcinoma from inner thigh lesion. CXR 02-06-14 showed left lower lobe lung mass 3.6 x 3.6 x 3.6 cm, with no adenopathy and no bony lesions, emphysematous changes. CT chest 02-09-14 showed the lower lobe mass 3.7 x 3.5 x 3.3 cm as well as 3 mm RLL nodule and faint 4 mm LUL nodule, no mediastinal or hilar nodes, no axillary or supraclavicular nodes, and upper abdomen not remarkable. PET 02-18-14 measured LLL lung mass 4.6 x 3.9 cm, hypermetabolic, and noted multiple subcentimeter nodules scattered thruout lungs bilaterally; the right vulvar mass was also hypermetabolic, extending up into right side of lower vagina, extensive R>L and right external iliac adenopathy, no uptake in skeleton. 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 She had 3 stereotactic radiation treatments to the LLL pulmonary mass 54 Gy in 3 fractions 10-16, 10-19 and 03-04-14. She received IMRT to vulvar area from 03-09-14, completed 04-23-14, with weekly CDDP x7 03-09-14 thru 04-20-14. Radiation to vulva, vaginal, and pelvic/inguinal nodes was 60 Gy in 30 fractions to gross disease, 54 Gy in 30 fractions to high risk areas, 50.4Gy in 30 fractions to intermediate risk areas. She had vulvar, vaginal, and nodal boost of 6 Gy in 3 fractions to gross disease. Follow up CT chest 05-04-14 had cavitary, spiculated LLL mass 3.3 x 2.3 cm, having been 4.6 x 3.9 cm prior to IMRT, and no change in scattered small bilateral pulmonary nodules, however PET 05-27-14  had progression in bilateral pulmonary nodules and partial response in local vulvar involvement. 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 CT CAP 08-10-14 had majority of lung nodules stable with a  few slightly increased, 2 mm hepatic lucencies possibly cysts, no adenopathy AP. She had first nivolumab 09-14-14, dose reduced by 50% due to concerns for skin toxicity.     Review of systems as above, also: No fever. No new or different pain. No bleeding. No bladder symptoms. Skin biopsies at right knee and left shoulder Remainder of 10 point Review of Systems negative.  Objective:  Vital signs in last 24 hours:  BP 124/74 mmHg  Pulse 72  Temp(Src) 97.9 F (36.6 C) (Oral)  Resp 18  Ht 5' 1"  (1.549 m)  Wt 126 lb 11.2 oz (57.471 kg)  BMI 23.95 kg/m2  SpO2 99% Weight up 1 lb. Alert, oriented and appropriate. Ambulatory without difficulty.   HEENT:PERRL, sclerae not icteric. Oral mucosa moist without lesions, posterior pharynx clear.  Neck supple. No JVD.  Lymphatics:no cervical,supraclavicular or inguinal adenopathy Resp: clear to auscultation bilaterally and normal percussion bilaterally Cardio: regular rate and rhythm. No gallop. GI: soft, nontender, not distended, no mass or organomegaly. Normally active bowel sounds.  Musculoskeletal/ Extremities: without pitting edema, cords, tenderness Neuro: no peripheral neuropathy. Otherwise nonfocal Skin scattered slightly erythematous rash upper back and UE, some scattered excoriated areas across abdomen, erythema right inguinal area without skin desquamation. No ecchymosis, petechiae PAC site not remarkable  Lab Results:  Results for orders placed or performed in visit on 11/02/14  CBC with Differential  Result Value Ref Range   WBC 7.3 3.9 - 10.3 10e3/uL   NEUT# 5.7 1.5 - 6.5 10e3/uL   HGB 12.1 11.6 - 15.9 g/dL   HCT 36.7 34.8 - 46.6 %   Platelets 188 145 - 400 10e3/uL   MCV 96.3 79.5 - 101.0 fL   MCH 31.8 25.1 - 34.0 pg   MCHC 33.0 31.5 - 36.0 g/dL   RBC 3.81 3.70 - 5.45 10e6/uL   RDW 13.1 11.2 - 14.5 %   lymph# 0.8 (L) 0.9 - 3.3 10e3/uL   MONO# 0.4 0.1 - 0.9 10e3/uL   Eosinophils Absolute 0.2 0.0 - 0.5 10e3/uL    Basophils Absolute 0.0 0.0 - 0.1 10e3/uL   NEUT% 78.5 (H) 38.4 - 76.8 %   LYMPH% 11.7 (L) 14.0 - 49.7 %   MONO% 6.2 0.0 - 14.0 %   EOS% 2.9 0.0 - 7.0 %   BASO% 0.7 0.0 - 2.0 %  Comprehensive metabolic panel (Cmet) - [facility]  Result Value Ref Range   Sodium 140 136 - 145 mEq/L   Potassium 4.6 3.5 - 5.1 mEq/L   Chloride 103 98 - 109 mEq/L   CO2 29 22 - 29 mEq/L   Glucose 115 70 - 140 mg/dl   BUN 14.3 7.0 - 26.0 mg/dL   Creatinine 1.0 0.6 - 1.1 mg/dL   Total Bilirubin 0.42 0.20 - 1.20 mg/dL   Alkaline Phosphatase 92 40 - 150 U/L   AST 13 5 - 34 U/L   ALT 13 0 - 55 U/L   Total Protein 7.2 6.4 - 8.3 g/dL   Albumin 3.7 3.5 - 5.0 g/dL   Calcium 10.2 8.4 - 10.4 mg/dL   Anion Gap 8 3 - 11 mEq/L   EGFR 58 (L) >90 ml/min/1.73 m2  TSH-Thyroid Stimulating Hormone  Result Value Ref Range   TSH 2.823  0.308 - 3.960 m(IU)/L     Studies/Results:  Dg Chest 2 View  11/02/2014   CLINICAL DATA:  Metastatic squamous cell cancer of the vulva. Bilateral lung metastases.  EXAM: CHEST  2 VIEW  COMPARISON:  Chest CT dated 08/10/2014 and chest x-ray dated 02/23/2014  FINDINGS: There has been progression of the size and number of the numerous bilateral pulmonary metastases. Exception as the cavitary lesion in the left lower lobe which appears slightly less prominent. There are now 14 and 17 mm metastases in the right lung base with more numerous smaller nodules bilaterally.  Heart size and pulmonary vascularity are normal. No effusions. Power port in place.  No osseous abnormality.  IMPRESSION: Progression  of size and number of multiple pulmonary metastases.   Electronically Signed   By: Lorriane Shire M.D.   On: 11/02/2014 10:53    Medications: I have reviewed the patient's current medications. Script for prednisone 20 mg, three tabs with food daily as directed #30, which she is to begin after upcoming Nivolumab IF/when significant rash apparent.   DISCUSSION: CXR discussed - comparisons are difficult due to  timing of start of nivolumab. As she remains essentially asymptomatic from the pulmonary involvement and with prompt improvement in nivolumab related skin rash last cycle, we will given cycle 2 as scheduled on 11-05-14. She will have prednisone on hand to begin as soon as rash is becoming significant. We will follow imaging ongoing.  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 Improved local disease with RT/ sensitizing CDDP completed 04-23-14, and additional taxol CDDP thru 07-27-14.  2.Squamous cell carcinoma of LLL lung: long past tobacco; imaging and final path from biopsy suggests primary lung, tho same histology as vulvar primary. Post SBRT to dominant LLL nodule, which is stable, and bilateral pulmonary nodules. Cycles 1 and 2 nivolumab given 5-12 and 10-08-14 at 50% dosing, increased puritis/ rash after cycle 2 resolved with short course of prednisone. She would like to continue nivolumab if possible, until we can determine response. 3.History of eczema since childhood, with severe flares during treatment thus far. Interventions by dermatology helpful, Dr Juel Burrow care much appreciated.  4.intermittent neuropathy symptoms lower legs: not typical taxol, improved now.  5.PAC in 6.history migraine HAs. No HA with zofran.  7.allergy PCN  8.BTL  9. Advance Directives done. Patient understands that treatment is not expected to be curative.  10.increased GERD better with protonix daily  11.cytopenias resolved 12.long past tobacco 13.transient speech difficulty with numbness of lip andleft hand early Dec. Neurology involved, no metastatic disease found. On ASA  All questions addressed and patient is comfortable with discussion and plans, knows that she can call at any time if needed prior to next scheduled appointments. Nivolumab orders  confirmed, no increase in dose. I will see her back 11-13-14. Time spent 25 min including >50% counseling and coordination of care.   LIVESAY,LENNIS P, MD   11/02/2014, 12:21 PM

## 2014-11-03 ENCOUNTER — Other Ambulatory Visit: Payer: Self-pay | Admitting: Oncology

## 2014-11-04 ENCOUNTER — Telehealth: Payer: Self-pay | Admitting: Oncology

## 2014-11-04 NOTE — Telephone Encounter (Signed)
Appointments made per pof and per pof patient will get a new schedule 6/23

## 2014-11-05 ENCOUNTER — Ambulatory Visit (HOSPITAL_BASED_OUTPATIENT_CLINIC_OR_DEPARTMENT_OTHER): Payer: 59

## 2014-11-05 ENCOUNTER — Other Ambulatory Visit (HOSPITAL_BASED_OUTPATIENT_CLINIC_OR_DEPARTMENT_OTHER): Payer: 59

## 2014-11-05 VITALS — BP 134/61 | HR 66 | Temp 98.3°F | Resp 16

## 2014-11-05 DIAGNOSIS — IMO0002 Reserved for concepts with insufficient information to code with codable children: Secondary | ICD-10-CM

## 2014-11-05 DIAGNOSIS — Z5112 Encounter for antineoplastic immunotherapy: Secondary | ICD-10-CM

## 2014-11-05 DIAGNOSIS — C778 Secondary and unspecified malignant neoplasm of lymph nodes of multiple regions: Secondary | ICD-10-CM | POA: Diagnosis not present

## 2014-11-05 DIAGNOSIS — C799 Secondary malignant neoplasm of unspecified site: Secondary | ICD-10-CM

## 2014-11-05 DIAGNOSIS — C3432 Malignant neoplasm of lower lobe, left bronchus or lung: Secondary | ICD-10-CM

## 2014-11-05 DIAGNOSIS — C541 Malignant neoplasm of endometrium: Secondary | ICD-10-CM | POA: Diagnosis not present

## 2014-11-05 DIAGNOSIS — C3492 Malignant neoplasm of unspecified part of left bronchus or lung: Secondary | ICD-10-CM

## 2014-11-05 DIAGNOSIS — C792 Secondary malignant neoplasm of skin: Secondary | ICD-10-CM | POA: Diagnosis not present

## 2014-11-05 LAB — CBC WITH DIFFERENTIAL/PLATELET
BASO%: 0.2 % (ref 0.0–2.0)
BASOS ABS: 0 10*3/uL (ref 0.0–0.1)
EOS%: 2.6 % (ref 0.0–7.0)
Eosinophils Absolute: 0.1 10*3/uL (ref 0.0–0.5)
HCT: 35.2 % (ref 34.8–46.6)
HGB: 11.3 g/dL — ABNORMAL LOW (ref 11.6–15.9)
LYMPH#: 0.7 10*3/uL — AB (ref 0.9–3.3)
LYMPH%: 12.6 % — ABNORMAL LOW (ref 14.0–49.7)
MCH: 31.8 pg (ref 25.1–34.0)
MCHC: 32.1 g/dL (ref 31.5–36.0)
MCV: 99.2 fL (ref 79.5–101.0)
MONO#: 0.6 10*3/uL (ref 0.1–0.9)
MONO%: 10 % (ref 0.0–14.0)
NEUT#: 4.1 10*3/uL (ref 1.5–6.5)
NEUT%: 74.6 % (ref 38.4–76.8)
Platelets: 158 10*3/uL (ref 145–400)
RBC: 3.55 10*6/uL — AB (ref 3.70–5.45)
RDW: 12.7 % (ref 11.2–14.5)
WBC: 5.5 10*3/uL (ref 3.9–10.3)

## 2014-11-05 LAB — COMPREHENSIVE METABOLIC PANEL (CC13)
ALBUMIN: 3.4 g/dL — AB (ref 3.5–5.0)
ALK PHOS: 82 U/L (ref 40–150)
ALT: 9 U/L (ref 0–55)
AST: 14 U/L (ref 5–34)
Anion Gap: 7 mEq/L (ref 3–11)
BILIRUBIN TOTAL: 0.24 mg/dL (ref 0.20–1.20)
BUN: 13.3 mg/dL (ref 7.0–26.0)
CHLORIDE: 105 meq/L (ref 98–109)
CO2: 27 mEq/L (ref 22–29)
Calcium: 9.5 mg/dL (ref 8.4–10.4)
Creatinine: 0.9 mg/dL (ref 0.6–1.1)
EGFR: 67 mL/min/{1.73_m2} — ABNORMAL LOW (ref >90)
GLUCOSE: 99 mg/dL (ref 70–140)
Potassium: 4 mEq/L (ref 3.5–5.1)
SODIUM: 140 meq/L (ref 136–145)
Total Protein: 6.8 g/dL (ref 6.4–8.3)

## 2014-11-05 MED ORDER — SODIUM CHLORIDE 0.9 % IV SOLN
Freq: Once | INTRAVENOUS | Status: AC
  Administered 2014-11-05: 09:00:00 via INTRAVENOUS

## 2014-11-05 MED ORDER — SODIUM CHLORIDE 0.9 % IV SOLN
1.5000 mg/kg | Freq: Once | INTRAVENOUS | Status: AC
  Administered 2014-11-05: 90 mg via INTRAVENOUS
  Filled 2014-11-05: qty 9

## 2014-11-05 MED ORDER — SODIUM CHLORIDE 0.9 % IJ SOLN
10.0000 mL | INTRAMUSCULAR | Status: DC | PRN
  Administered 2014-11-05: 10 mL
  Filled 2014-11-05: qty 10

## 2014-11-05 MED ORDER — HEPARIN SOD (PORK) LOCK FLUSH 100 UNIT/ML IV SOLN
500.0000 [IU] | Freq: Once | INTRAVENOUS | Status: AC | PRN
  Administered 2014-11-05: 500 [IU]
  Filled 2014-11-05: qty 5

## 2014-11-05 NOTE — Patient Instructions (Signed)
[facility] Discharge Instructions for Patients Receiving Chemotherapy  Today you received the following chemotherapy agents: Nivolumab  To help prevent nausea and vomiting after your treatment, we encourage you to take your nausea medication as prescribed by your physician.    If you develop nausea and vomiting that is not controlled by your nausea medication, call the clinic.   BELOW ARE SYMPTOMS THAT SHOULD BE REPORTED IMMEDIATELY:  *FEVER GREATER THAN 100.5 F  *CHILLS WITH OR WITHOUT FEVER  NAUSEA AND VOMITING THAT IS NOT CONTROLLED WITH YOUR NAUSEA MEDICATION  *UNUSUAL SHORTNESS OF BREATH  *UNUSUAL BRUISING OR BLEEDING  TENDERNESS IN MOUTH AND THROAT WITH OR WITHOUT PRESENCE OF ULCERS  *URINARY PROBLEMS  *BOWEL PROBLEMS  UNUSUAL RASH Items with * indicate a potential emergency and should be followed up as soon as possible.  Feel free to call the clinic you have any questions or concerns. The clinic phone number is [phone].  Please show the CHEMO ALERT CARD at check-in to the Emergency Department and triage nurse.   

## 2014-11-11 ENCOUNTER — Other Ambulatory Visit: Payer: Self-pay | Admitting: Oncology

## 2014-11-13 ENCOUNTER — Other Ambulatory Visit (HOSPITAL_BASED_OUTPATIENT_CLINIC_OR_DEPARTMENT_OTHER): Payer: 59

## 2014-11-13 ENCOUNTER — Telehealth: Payer: Self-pay | Admitting: Oncology

## 2014-11-13 ENCOUNTER — Ambulatory Visit (HOSPITAL_BASED_OUTPATIENT_CLINIC_OR_DEPARTMENT_OTHER): Payer: 59 | Admitting: Oncology

## 2014-11-13 ENCOUNTER — Encounter: Payer: Self-pay | Admitting: Oncology

## 2014-11-13 VITALS — BP 120/78 | HR 80 | Temp 97.8°F | Resp 18 | Ht 61.0 in | Wt 127.8 lb

## 2014-11-13 DIAGNOSIS — L270 Generalized skin eruption due to drugs and medicaments taken internally: Secondary | ICD-10-CM

## 2014-11-13 DIAGNOSIS — L259 Unspecified contact dermatitis, unspecified cause: Secondary | ICD-10-CM

## 2014-11-13 DIAGNOSIS — C792 Secondary malignant neoplasm of skin: Secondary | ICD-10-CM | POA: Diagnosis not present

## 2014-11-13 DIAGNOSIS — C778 Secondary and unspecified malignant neoplasm of lymph nodes of multiple regions: Secondary | ICD-10-CM

## 2014-11-13 DIAGNOSIS — C7802 Secondary malignant neoplasm of left lung: Secondary | ICD-10-CM

## 2014-11-13 DIAGNOSIS — Z79899 Other long term (current) drug therapy: Secondary | ICD-10-CM

## 2014-11-13 DIAGNOSIS — C799 Secondary malignant neoplasm of unspecified site: Secondary | ICD-10-CM

## 2014-11-13 DIAGNOSIS — C519 Malignant neoplasm of vulva, unspecified: Secondary | ICD-10-CM

## 2014-11-13 DIAGNOSIS — L309 Dermatitis, unspecified: Secondary | ICD-10-CM

## 2014-11-13 DIAGNOSIS — C3432 Malignant neoplasm of lower lobe, left bronchus or lung: Secondary | ICD-10-CM | POA: Diagnosis not present

## 2014-11-13 DIAGNOSIS — C3492 Malignant neoplasm of unspecified part of left bronchus or lung: Secondary | ICD-10-CM | POA: Diagnosis not present

## 2014-11-13 DIAGNOSIS — Z95828 Presence of other vascular implants and grafts: Secondary | ICD-10-CM

## 2014-11-13 DIAGNOSIS — C7801 Secondary malignant neoplasm of right lung: Secondary | ICD-10-CM

## 2014-11-13 DIAGNOSIS — IMO0002 Reserved for concepts with insufficient information to code with codable children: Secondary | ICD-10-CM

## 2014-11-13 LAB — CBC WITH DIFFERENTIAL/PLATELET
BASO%: 0.6 % (ref 0.0–2.0)
Basophils Absolute: 0 10*3/uL (ref 0.0–0.1)
EOS%: 3.6 % (ref 0.0–7.0)
Eosinophils Absolute: 0.2 10*3/uL (ref 0.0–0.5)
HCT: 36.7 % (ref 34.8–46.6)
HGB: 12 g/dL (ref 11.6–15.9)
LYMPH%: 19.2 % (ref 14.0–49.7)
MCH: 31.7 pg (ref 25.1–34.0)
MCHC: 32.7 g/dL (ref 31.5–36.0)
MCV: 97.1 fL (ref 79.5–101.0)
MONO#: 0.6 10*3/uL (ref 0.1–0.9)
MONO%: 12 % (ref 0.0–14.0)
NEUT%: 64.6 % (ref 38.4–76.8)
NEUTROS ABS: 3.1 10*3/uL (ref 1.5–6.5)
Platelets: 172 10*3/uL (ref 145–400)
RBC: 3.78 10*6/uL (ref 3.70–5.45)
RDW: 13 % (ref 11.2–14.5)
WBC: 4.8 10*3/uL (ref 3.9–10.3)
lymph#: 0.9 10*3/uL (ref 0.9–3.3)

## 2014-11-13 LAB — COMPREHENSIVE METABOLIC PANEL (CC13)
ALK PHOS: 97 U/L (ref 40–150)
ALT: 14 U/L (ref 0–55)
ANION GAP: 9 meq/L (ref 3–11)
AST: 16 U/L (ref 5–34)
Albumin: 3.6 g/dL (ref 3.5–5.0)
BILIRUBIN TOTAL: 0.34 mg/dL (ref 0.20–1.20)
BUN: 15.8 mg/dL (ref 7.0–26.0)
CO2: 26 mEq/L (ref 22–29)
Calcium: 9.9 mg/dL (ref 8.4–10.4)
Chloride: 105 mEq/L (ref 98–109)
Creatinine: 1.1 mg/dL (ref 0.6–1.1)
EGFR: 56 mL/min/{1.73_m2} — AB (ref >90)
GLUCOSE: 105 mg/dL (ref 70–140)
POTASSIUM: 4.1 meq/L (ref 3.5–5.1)
Sodium: 141 mEq/L (ref 136–145)
Total Protein: 7.2 g/dL (ref 6.4–8.3)

## 2014-11-13 NOTE — Telephone Encounter (Signed)
Appointments made and patient will get an avs at her next established 7/7 appointment

## 2014-11-13 NOTE — Progress Notes (Signed)
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 The vulvar disease continues clinically controlled since radiation and chemotherapy. She is on treatment with nivolumab every 14 days, cycle 3 given 11-05-14 still at reduced dose, but tolerated with no significant skin reaction that treatment.   Patient has had no flare of chronic severe dermatitis and none of the nivolumab puritis/ rash that occurred especially after cycle 2; she did not need to take oral steroids since most recent treatment. Energy is good, no increased SOB, fairly active at home. No pain now at perineum. Skin reaction right groin improved. Soreness left lateral lower ribs around anteriorly under left breast when twists, improved initially then aggravated with other activity; new soreness at right upper inner thigh occurring when she stepped out of high truck. No increase in the chest discomfort with deep inspiration, is tender to direct palpation. No swelling in RLE. No vesicular rash in those areas.   PAC in, flushed  11-05-14.   ONCOLOGIC HISTORY Patient presented with gradually enlarging mass at right vulva for ~ 8 months, uncomfortable with direct pressure and ulcerated with occasional bleeding. She was seen in ED on 02-02-14 with right inguinal adenopathy and right vulvar masses, by Dr Elonda Husky on 02-03-14, then by Dr Skeet Latch on 02-05-14, with biopsy of both vulvar mass and satellite lesions on inner thigh and mons. Exam found 7 cm right vulvar mass with 4 cm extension to distal vagina, extending anteriorly to urethral meatus and in ischiorectal area, with satellite lesions inner thigh and right mons.  Pathology [phone]) showed invasive squamous cell carcinoma arising in background of high grade squamous intraepithelial lesion from vulva biopsy and invasive moderately differentiated squamous cell carcinoma from inner thigh lesion. CXR 02-06-14 showed left lower lobe lung mass 3.6 x 3.6 x 3.6 cm, with no adenopathy and no bony lesions, emphysematous changes. CT chest 02-09-14 showed the lower lobe mass 3.7 x 3.5 x 3.3 cm as well as 3 mm RLL nodule and faint 4 mm LUL nodule, no mediastinal or hilar nodes, no axillary or supraclavicular nodes, and upper abdomen not remarkable. PET 02-18-14 measured LLL lung mass 4.6 x 3.9 cm, hypermetabolic, and noted multiple subcentimeter nodules scattered thruout lungs bilaterally; the right vulvar mass was also hypermetabolic, extending up into right side of lower vagina, extensive R>L and right external iliac adenopathy, no uptake in skeleton. 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 She had 3 stereotactic radiation treatments to the LLL pulmonary mass 54 Gy in 3 fractions 10-16, 10-19 and 03-04-14. She received IMRT to vulvar area from 03-09-14, completed 04-23-14, with weekly CDDP x7 03-09-14 thru 04-20-14. Radiation to vulva, vaginal, and pelvic/inguinal nodes was 60 Gy in 30 fractions to gross disease, 54 Gy in 30 fractions to high risk areas, 50.4Gy in 30 fractions to intermediate risk areas. She had vulvar, vaginal, and nodal boost of 6 Gy in 3 fractions to gross disease. Follow up CT chest 05-04-14 had cavitary, spiculated LLL mass 3.3 x 2.3 cm, having been 4.6 x 3.9 cm prior to IMRT, and no change in scattered small bilateral pulmonary nodules, however PET 05-27-14 had progression in bilateral pulmonary nodules and partial  response in local vulvar involvement. 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 CT CAP 08-10-14 had majority of lung nodules  stable with a few slightly increased, 2 mm hepatic lucencies possibly cysts, no adenopathy AP. She had first nivolumab 09-14-14, dose reduced by 50% due to concerns for skin toxicity.    Review of systems as above, also: No fever or symptoms of infection. No bleeding. Bladder and bowels ok.  Remainder of 10 point Review of Systems negative.  Objective:  Vital signs in last 24 hours:  BP 120/78 mmHg  Pulse 80  Temp(Src) 97.8 F (36.6 C) (Oral)  Resp 18  Ht 5' 1"  (1.549 m)  Wt 127 lb 12.8 oz (57.97 kg)  BMI 24.16 kg/m2  SpO2 98% Weight up 1 lb. Looks comfortable seated in chair, respirations not labored RA, not scratching.  Alert, oriented and appropriate, very pleasant as always. Ambulatory without difficulty, gets on and off exam table easily.   HEENT:PERRL, sclerae not icteric. Oral mucosa moist without lesions, posterior pharynx clear.  Neck supple. No JVD.  Lymphatics:no cervical,supraclavicular, axillary or inguinal adenopathy Resp: somewhat diminished BS bilaterally as baseline otherwise clear to auscultation and no dullness to percussion bilaterally Cardio: regular rate and rhythm. No gallop. GI: soft, nontender, not distended, no mass or organomegaly. Normally active bowel sounds.  Musculoskeletal/ Extremities:point tender lateral lower ribs on left without mass/ rub/ ecchymosis. Mildly tender in muscles right upper and inner thigh without mass palpable. Otherwise without pitting edema, cords, tenderness in extremities Neuro: no peripheral neuropathy. Otherwise nonfocal. Psych appropriate mood and affect Skin fairly minimal chronic dermatitis rash on UE and scattered on anterior abdomen, with nothing suggesting variant rash from nivolumab.  Right inguinal area still with moderate erythema, no desquamation, better than previously. No ecchymosis, petechiae Portacath-without erythema or tenderness  Lab Results:  Results for orders placed or performed in visit on 11/13/14  CBC  with Differential  Result Value Ref Range   WBC 4.8 3.9 - 10.3 10e3/uL   NEUT# 3.1 1.5 - 6.5 10e3/uL   HGB 12.0 11.6 - 15.9 g/dL   HCT 36.7 34.8 - 46.6 %   Platelets 172 145 - 400 10e3/uL   MCV 97.1 79.5 - 101.0 fL   MCH 31.7 25.1 - 34.0 pg   MCHC 32.7 31.5 - 36.0 g/dL   RBC 3.78 3.70 - 5.45 10e6/uL   RDW 13.0 11.2 - 14.5 %   lymph# 0.9 0.9 - 3.3 10e3/uL   MONO# 0.6 0.1 - 0.9 10e3/uL   Eosinophils Absolute 0.2 0.0 - 0.5 10e3/uL   Basophils Absolute 0.0 0.0 - 0.1 10e3/uL   NEUT% 64.6 38.4 - 76.8 %   LYMPH% 19.2 14.0 - 49.7 %   MONO% 12.0 0.0 - 14.0 %   EOS% 3.6 0.0 - 7.0 %   BASO% 0.6 0.0 - 2.0 %  Comprehensive metabolic panel (Cmet) - [facility]  Result Value Ref Range   Sodium 141 136 - 145 mEq/L   Potassium 4.1 3.5 - 5.1 mEq/L   Chloride 105 98 - 109 mEq/L   CO2 26 22 - 29 mEq/L   Glucose 105 70 - 140 mg/dl   BUN 15.8 7.0 - 26.0 mg/dL   Creatinine 1.1 0.6 - 1.1 mg/dL   Total Bilirubin 0.34 0.20 - 1.20 mg/dL   Alkaline Phosphatase 97 40 - 150 U/L   AST 16 5 - 34 U/L   ALT 14 0 - 55 U/L  Total Protein 7.2 6.4 - 8.3 g/dL   Albumin 3.6 3.5 - 5.0 g/dL   Calcium 9.9 8.4 - 10.4 mg/dL   Anion Gap 9 3 - 11 mEq/L   EGFR 56 (L) >90 ml/min/1.73 m2    TSH on 11-02-14 was 2.82  Studies/Results:  Recent imaging including CXR 11-02-14 (after left lateral chest pain had started) reviewed by MD now, with nothing obvious for the symptoms. Note no bone disease by imaging to date.   Medications: I have reviewed the patient's current medications. Nivolumab orders changed with increase in dose to 2 mg/kg from previous 1.5 mg/kg She will try tramadol already available at home for the "pulled muscles"  DISCUSSION Steroids with nivolumab discussed with [facility] pharmacist since last visit - no concern identified that steroid use interferes with effectiveness of nivolumab against the malignancy. Discussed this with patient and daughter now.  Patient will call if no problems next week, as could  cancel MD on day of treatment 7-7 if she is doing well, tho I am glad to see her if any concerns.  Discussed increasing nivolumab dose slightly for upcoming treatment. I will see her at least 7-18 prior to treatment due on 12-03-14. I will ask Awilda Metro, PA to see 12-17-14 prior to treatment that day.    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 Improved local disease with RT/ sensitizing CDDP completed 04-23-14, and additional taxol CDDP thru 07-27-14. She has return appointment with Dr Isidore Moos in Oct. Dr Denman George saw her last in April, at which time she suggested 3 mo follow up with gyn onc which I will request 2.Squamous cell carcinoma of LLL lung: long past tobacco; imaging and final path suggests primary lung, tho same histology as vulvar primary. Post SBRT to dominant LLL nodule, which is stable, and bilateral subcentimeter pulmonary nodules majority stable, some slightly increased by CT 08-10-14 and still very obvious by CXR 11-02-14 tho not more symptomatic. Cycle 1 nivolumab 09-24-14, dose reduced due to concerns about skin reaction. Cycles 2 and 3 again dose reduced; will try increasing dose slightly with treatment on 11-19-14. 3.History of severe dermatitis and eczema since childhood, with severe flares during treatment thus far. Interventions by dermatology helpful, Dr Juel Burrow care much appreciated.The nivolumab skin symptoms are different than her usual dermatitis and responded well to prednisone 60 mg daily after initial cycles. She has the prednisone available to start if needed and let this office know if so. 4.intermittent neuropathy symptoms lower legs: not typical for chemotherapy but taxol held cycle # 6, improved now.  5.PAC in 6.history migraine HAs. No HA with zofran.  7.allergy PCN  8.BTL  9. Advance Directives done. Patient  understands that treatment is not expected to be curative.  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 Thrombocytopenia resolved. 12.long past tobacco 13.transient speech difficulty with numbness of lip andleft hand early Dec. Neurology involved, no metastatic disease found. On ASA.    Nivolumab orders adjusted. All questions answered. Time spent 25 min including >50% counseling and coordination of care.   LIVESAY,LENNIS P, MD   11/13/2014, 9:06 AM

## 2014-11-15 ENCOUNTER — Other Ambulatory Visit: Payer: Self-pay | Admitting: Oncology

## 2014-11-16 ENCOUNTER — Other Ambulatory Visit: Payer: Self-pay | Admitting: Oncology

## 2014-11-17 ENCOUNTER — Telehealth: Payer: Self-pay | Admitting: *Deleted

## 2014-11-17 ENCOUNTER — Other Ambulatory Visit: Payer: Self-pay | Admitting: *Deleted

## 2014-11-17 DIAGNOSIS — T148XXA Other injury of unspecified body region, initial encounter: Secondary | ICD-10-CM

## 2014-11-17 MED ORDER — CYCLOBENZAPRINE HCL 10 MG PO TABS: ORAL_TABLET | ORAL | Status: AC

## 2014-11-17 NOTE — Telephone Encounter (Signed)
VM message received @ 9:54 am to let Dr. Marko Plume know she was doing ok other than pulled muscle in her leg/thigh. She is asking if she needs a muscle relaxant.

## 2014-11-17 NOTE — Telephone Encounter (Signed)
Re triage note questioning muscle relaxant  OK to try flexeril 10 mg  1/2 - 1 tablet every 8-12 hrs prn muscle spasm, may make drowsy.   #10. Can try heat or ice to area also if either of those help  She can keep apt with me day of treatment this week or just treatment without MD if doesn't need to see me.  thanks

## 2014-11-17 NOTE — Telephone Encounter (Signed)
Flexeril called in to pt's pharmacy. TC to patient to let know she can pick up prescription later on this afternoon. Advised her that it will make her drowsy, not to drive etc when she takes it.  Pt verbalized understanding.  She states she will not need to see Dr. Marko Plume prior to treatment this week.  Cancelled that appt. Will have labs and treatment.

## 2014-11-19 ENCOUNTER — Ambulatory Visit: Payer: 59 | Admitting: Oncology

## 2014-11-19 ENCOUNTER — Ambulatory Visit (HOSPITAL_BASED_OUTPATIENT_CLINIC_OR_DEPARTMENT_OTHER): Payer: 59

## 2014-11-19 ENCOUNTER — Other Ambulatory Visit (HOSPITAL_BASED_OUTPATIENT_CLINIC_OR_DEPARTMENT_OTHER): Payer: 59

## 2014-11-19 VITALS — BP 99/86 | HR 90 | Temp 98.1°F | Resp 15

## 2014-11-19 DIAGNOSIS — C3492 Malignant neoplasm of unspecified part of left bronchus or lung: Secondary | ICD-10-CM

## 2014-11-19 DIAGNOSIS — C3432 Malignant neoplasm of lower lobe, left bronchus or lung: Secondary | ICD-10-CM | POA: Diagnosis not present

## 2014-11-19 DIAGNOSIS — C7801 Secondary malignant neoplasm of right lung: Secondary | ICD-10-CM

## 2014-11-19 DIAGNOSIS — IMO0002 Reserved for concepts with insufficient information to code with codable children: Secondary | ICD-10-CM

## 2014-11-19 DIAGNOSIS — C799 Secondary malignant neoplasm of unspecified site: Secondary | ICD-10-CM

## 2014-11-19 DIAGNOSIS — Z5112 Encounter for antineoplastic immunotherapy: Secondary | ICD-10-CM

## 2014-11-19 DIAGNOSIS — C7802 Secondary malignant neoplasm of left lung: Secondary | ICD-10-CM

## 2014-11-19 LAB — CBC WITH DIFFERENTIAL/PLATELET
BASO%: 0.1 % (ref 0.0–2.0)
Basophils Absolute: 0 10*3/uL (ref 0.0–0.1)
EOS%: 2.3 % (ref 0.0–7.0)
Eosinophils Absolute: 0.2 10*3/uL (ref 0.0–0.5)
HCT: 39.1 % (ref 34.8–46.6)
HGB: 12.8 g/dL (ref 11.6–15.9)
LYMPH%: 11.4 % — AB (ref 14.0–49.7)
MCH: 31.2 pg (ref 25.1–34.0)
MCHC: 32.7 g/dL (ref 31.5–36.0)
MCV: 95.4 fL (ref 79.5–101.0)
MONO#: 0.8 10*3/uL (ref 0.1–0.9)
MONO%: 10.7 % (ref 0.0–14.0)
NEUT#: 5.5 10*3/uL (ref 1.5–6.5)
NEUT%: 75.5 % (ref 38.4–76.8)
Platelets: 193 10*3/uL (ref 145–400)
RBC: 4.1 10*6/uL (ref 3.70–5.45)
RDW: 13 % (ref 11.2–14.5)
WBC: 7.3 10*3/uL (ref 3.9–10.3)
lymph#: 0.8 10*3/uL — ABNORMAL LOW (ref 0.9–3.3)

## 2014-11-19 LAB — COMPREHENSIVE METABOLIC PANEL (CC13)
ALT: 13 U/L (ref 0–55)
AST: 14 U/L (ref 5–34)
Albumin: 3.6 g/dL (ref 3.5–5.0)
Alkaline Phosphatase: 108 U/L (ref 40–150)
Anion Gap: 12 mEq/L — ABNORMAL HIGH (ref 3–11)
BILIRUBIN TOTAL: 0.46 mg/dL (ref 0.20–1.20)
BUN: 24.5 mg/dL (ref 7.0–26.0)
CHLORIDE: 99 meq/L (ref 98–109)
CO2: 25 mEq/L (ref 22–29)
Calcium: 10.5 mg/dL — ABNORMAL HIGH (ref 8.4–10.4)
Creatinine: 1.2 mg/dL — ABNORMAL HIGH (ref 0.6–1.1)
EGFR: 46 mL/min/{1.73_m2} — ABNORMAL LOW (ref >90)
Glucose: 107 mg/dl (ref 70–140)
Potassium: 3.9 mEq/L (ref 3.5–5.1)
SODIUM: 136 meq/L (ref 136–145)
Total Protein: 7.5 g/dL (ref 6.4–8.3)

## 2014-11-19 LAB — TSH CHCC: TSH: 4.277 m[IU]/L — AB (ref 0.308–3.960)

## 2014-11-19 MED ORDER — SODIUM CHLORIDE 0.9 % IV SOLN
1.8000 mg/kg | Freq: Once | INTRAVENOUS | Status: AC
  Administered 2014-11-19: 100 mg via INTRAVENOUS
  Filled 2014-11-19: qty 10

## 2014-11-19 MED ORDER — HEPARIN SOD (PORK) LOCK FLUSH 100 UNIT/ML IV SOLN
500.0000 [IU] | Freq: Once | INTRAVENOUS | Status: AC | PRN
  Administered 2014-11-19: 500 [IU]
  Filled 2014-11-19: qty 5

## 2014-11-19 MED ORDER — SODIUM CHLORIDE 0.9 % IJ SOLN
10.0000 mL | INTRAMUSCULAR | Status: DC | PRN
  Administered 2014-11-19: 10 mL
  Filled 2014-11-19: qty 10

## 2014-11-19 MED ORDER — SODIUM CHLORIDE 0.9 % IV SOLN
Freq: Once | INTRAVENOUS | Status: AC
  Administered 2014-11-19: 09:00:00 via INTRAVENOUS

## 2014-11-19 NOTE — Patient Instructions (Signed)
[facility] Discharge Instructions for Patients Receiving Chemotherapy  Today you received the following chemotherapy agents Nivolumab.  To help prevent nausea and vomiting after your treatment, we encourage you to take your nausea medication as prescribed.   If you develop nausea and vomiting that is not controlled by your nausea medication, call the clinic.   BELOW ARE SYMPTOMS THAT SHOULD BE REPORTED IMMEDIATELY:  *FEVER GREATER THAN 100.5 F  *CHILLS WITH OR WITHOUT FEVER  NAUSEA AND VOMITING THAT IS NOT CONTROLLED WITH YOUR NAUSEA MEDICATION  *UNUSUAL SHORTNESS OF BREATH  *UNUSUAL BRUISING OR BLEEDING  TENDERNESS IN MOUTH AND THROAT WITH OR WITHOUT PRESENCE OF ULCERS  *URINARY PROBLEMS  *BOWEL PROBLEMS  UNUSUAL RASH Items with * indicate a potential emergency and should be followed up as soon as possible.  Feel free to call the clinic you have any questions or concerns. The clinic phone number is [phone].  Please show the CHEMO ALERT CARD at check-in to the Emergency Department and triage nurse.   

## 2014-11-24 ENCOUNTER — Telehealth: Payer: Self-pay | Admitting: Oncology

## 2014-11-24 NOTE — Telephone Encounter (Signed)
Returned Advertising account executive. Patient confirmed moving appointment from 07/18 to 07/21.

## 2014-11-27 ENCOUNTER — Other Ambulatory Visit: Payer: Self-pay | Admitting: Oncology

## 2014-11-27 ENCOUNTER — Telehealth: Payer: Self-pay | Admitting: Nurse Practitioner

## 2014-11-27 DIAGNOSIS — C7802 Secondary malignant neoplasm of left lung: Secondary | ICD-10-CM

## 2014-11-27 DIAGNOSIS — C7801 Secondary malignant neoplasm of right lung: Secondary | ICD-10-CM

## 2014-11-27 DIAGNOSIS — C3492 Malignant neoplasm of unspecified part of left bronchus or lung: Secondary | ICD-10-CM

## 2014-11-27 DIAGNOSIS — Z79899 Other long term (current) drug therapy: Secondary | ICD-10-CM

## 2014-11-27 NOTE — Telephone Encounter (Signed)
-----   Message from Gordy Levan, MD sent at 11/27/2014 12:51 PM EDT ----- Please let patient know that thyroid test from day of  treatment last week shows thyroid may be getting a little low from the nivolumab. I would like to have the blood work done the day before MD visit with next treatment - if ok with her, please move lab from 7-21 to 7-20, leave MD and treatment on 7-20. I have put the labs in for 7-20, but did not move the lab appointment until we let her know.  Please also ask how she did with treatment - be sure she has steroids available if rash from nivolumab flares up.  Thank you

## 2014-11-27 NOTE — Telephone Encounter (Signed)
Left message to call back for results as per MD below.

## 2014-11-29 ENCOUNTER — Other Ambulatory Visit: Payer: Self-pay | Admitting: Oncology

## 2014-11-30 ENCOUNTER — Other Ambulatory Visit: Payer: 59

## 2014-11-30 ENCOUNTER — Telehealth: Payer: Self-pay | Admitting: Nurse Practitioner

## 2014-11-30 ENCOUNTER — Ambulatory Visit: Payer: 59 | Admitting: Oncology

## 2014-11-30 NOTE — Telephone Encounter (Signed)
Patient informed of MD message below. OK with her to move labs to 7/20 per Dr. Marko Plume. She is tolerating treatment without difficulties; she used steroids twice last week to prevent rash but still has some on hand prn. Will send POF to scheduling to move lab apt to 7/20; MD visit and infusion apts to stay 7/21.

## 2014-11-30 NOTE — Telephone Encounter (Signed)
-----   Message from Gordy Levan, MD sent at 11/27/2014 12:51 PM EDT ----- Please let patient know that thyroid test from day of  treatment last week shows thyroid may be getting a little low from the nivolumab. I would like to have the blood work done the day before MD visit with next treatment - if ok with her, please move lab from 7-21 to 7-20, leave MD and treatment on 7-20. I have put the labs in for 7-20, but did not move the lab appointment until we let her know.  Please also ask how she did with treatment - be sure she has steroids available if rash from nivolumab flares up.  Thank you

## 2014-12-02 ENCOUNTER — Other Ambulatory Visit (HOSPITAL_BASED_OUTPATIENT_CLINIC_OR_DEPARTMENT_OTHER): Payer: 59

## 2014-12-02 DIAGNOSIS — C3432 Malignant neoplasm of lower lobe, left bronchus or lung: Secondary | ICD-10-CM

## 2014-12-02 DIAGNOSIS — C7801 Secondary malignant neoplasm of right lung: Secondary | ICD-10-CM

## 2014-12-02 DIAGNOSIS — C7802 Secondary malignant neoplasm of left lung: Secondary | ICD-10-CM

## 2014-12-02 DIAGNOSIS — Z79899 Other long term (current) drug therapy: Secondary | ICD-10-CM

## 2014-12-02 DIAGNOSIS — C3492 Malignant neoplasm of unspecified part of left bronchus or lung: Secondary | ICD-10-CM

## 2014-12-02 LAB — COMPREHENSIVE METABOLIC PANEL (CC13)
ALT: 7 U/L (ref 0–55)
AST: 11 U/L (ref 5–34)
Albumin: 3.4 g/dL — ABNORMAL LOW (ref 3.5–5.0)
Alkaline Phosphatase: 101 U/L (ref 40–150)
Anion Gap: 10 mEq/L (ref 3–11)
BUN: 17.7 mg/dL (ref 7.0–26.0)
CO2: 24 mEq/L (ref 22–29)
Calcium: 10.1 mg/dL (ref 8.4–10.4)
Chloride: 100 mEq/L (ref 98–109)
Creatinine: 1 mg/dL (ref 0.6–1.1)
EGFR: 61 mL/min/{1.73_m2} — ABNORMAL LOW (ref >90)
Glucose: 100 mg/dl (ref 70–140)
Potassium: 3.9 mEq/L (ref 3.5–5.1)
Sodium: 134 mEq/L — ABNORMAL LOW (ref 136–145)
Total Bilirubin: 0.46 mg/dL (ref 0.20–1.20)
Total Protein: 7.4 g/dL (ref 6.4–8.3)

## 2014-12-02 LAB — CBC WITH DIFFERENTIAL/PLATELET
BASO%: 0.1 % (ref 0.0–2.0)
BASOS ABS: 0 10*3/uL (ref 0.0–0.1)
EOS%: 2.7 % (ref 0.0–7.0)
Eosinophils Absolute: 0.2 10*3/uL (ref 0.0–0.5)
HEMATOCRIT: 35.3 % (ref 34.8–46.6)
HEMOGLOBIN: 11.4 g/dL — AB (ref 11.6–15.9)
LYMPH%: 10.9 % — ABNORMAL LOW (ref 14.0–49.7)
MCH: 30.1 pg (ref 25.1–34.0)
MCHC: 32.3 g/dL (ref 31.5–36.0)
MCV: 93.1 fL (ref 79.5–101.0)
MONO#: 0.7 10*3/uL (ref 0.1–0.9)
MONO%: 10.2 % (ref 0.0–14.0)
NEUT%: 76.1 % (ref 38.4–76.8)
NEUTROS ABS: 5.4 10*3/uL (ref 1.5–6.5)
PLATELETS: 228 10*3/uL (ref 145–400)
RBC: 3.79 10*6/uL (ref 3.70–5.45)
RDW: 13 % (ref 11.2–14.5)
WBC: 7.1 10*3/uL (ref 3.9–10.3)
lymph#: 0.8 10*3/uL — ABNORMAL LOW (ref 0.9–3.3)

## 2014-12-02 LAB — T3 UPTAKE: T3 UPTAKE: 28 % (ref 22–35)

## 2014-12-02 LAB — TSH CHCC: TSH: 2.82 m(IU)/L (ref 0.308–3.960)

## 2014-12-02 LAB — T4: T4 TOTAL: 10.8 ug/dL (ref 4.5–12.0)

## 2014-12-03 ENCOUNTER — Other Ambulatory Visit: Payer: 59

## 2014-12-03 ENCOUNTER — Ambulatory Visit (HOSPITAL_BASED_OUTPATIENT_CLINIC_OR_DEPARTMENT_OTHER): Payer: 59

## 2014-12-03 ENCOUNTER — Ambulatory Visit (HOSPITAL_BASED_OUTPATIENT_CLINIC_OR_DEPARTMENT_OTHER): Payer: 59 | Admitting: Oncology

## 2014-12-03 ENCOUNTER — Encounter: Payer: Self-pay | Admitting: Oncology

## 2014-12-03 VITALS — BP 107/65 | HR 90 | Temp 98.3°F | Resp 18 | Ht 61.0 in | Wt 121.5 lb

## 2014-12-03 DIAGNOSIS — C778 Secondary and unspecified malignant neoplasm of lymph nodes of multiple regions: Secondary | ICD-10-CM | POA: Diagnosis not present

## 2014-12-03 DIAGNOSIS — L259 Unspecified contact dermatitis, unspecified cause: Secondary | ICD-10-CM

## 2014-12-03 DIAGNOSIS — C3492 Malignant neoplasm of unspecified part of left bronchus or lung: Secondary | ICD-10-CM

## 2014-12-03 DIAGNOSIS — Z95828 Presence of other vascular implants and grafts: Secondary | ICD-10-CM

## 2014-12-03 DIAGNOSIS — C519 Malignant neoplasm of vulva, unspecified: Secondary | ICD-10-CM

## 2014-12-03 DIAGNOSIS — C541 Malignant neoplasm of endometrium: Secondary | ICD-10-CM | POA: Diagnosis not present

## 2014-12-03 DIAGNOSIS — IMO0002 Reserved for concepts with insufficient information to code with codable children: Secondary | ICD-10-CM

## 2014-12-03 DIAGNOSIS — C799 Secondary malignant neoplasm of unspecified site: Secondary | ICD-10-CM

## 2014-12-03 DIAGNOSIS — C3432 Malignant neoplasm of lower lobe, left bronchus or lung: Secondary | ICD-10-CM

## 2014-12-03 DIAGNOSIS — C7802 Secondary malignant neoplasm of left lung: Principal | ICD-10-CM

## 2014-12-03 DIAGNOSIS — L270 Generalized skin eruption due to drugs and medicaments taken internally: Secondary | ICD-10-CM

## 2014-12-03 DIAGNOSIS — K219 Gastro-esophageal reflux disease without esophagitis: Secondary | ICD-10-CM

## 2014-12-03 DIAGNOSIS — C7801 Secondary malignant neoplasm of right lung: Secondary | ICD-10-CM

## 2014-12-03 DIAGNOSIS — Z5112 Encounter for antineoplastic immunotherapy: Secondary | ICD-10-CM

## 2014-12-03 DIAGNOSIS — D6481 Anemia due to antineoplastic chemotherapy: Secondary | ICD-10-CM

## 2014-12-03 DIAGNOSIS — C792 Secondary malignant neoplasm of skin: Secondary | ICD-10-CM

## 2014-12-03 DIAGNOSIS — L309 Dermatitis, unspecified: Secondary | ICD-10-CM

## 2014-12-03 DIAGNOSIS — Z79899 Other long term (current) drug therapy: Secondary | ICD-10-CM

## 2014-12-03 DIAGNOSIS — Z87891 Personal history of nicotine dependence: Secondary | ICD-10-CM

## 2014-12-03 MED ORDER — HEPARIN SOD (PORK) LOCK FLUSH 100 UNIT/ML IV SOLN
500.0000 [IU] | Freq: Once | INTRAVENOUS | Status: AC
  Administered 2014-12-03: 500 [IU] via INTRAVENOUS
  Filled 2014-12-03: qty 5

## 2014-12-03 MED ORDER — SODIUM CHLORIDE 0.9 % IV SOLN
2.0000 mg/kg | Freq: Once | INTRAVENOUS | Status: AC
  Administered 2014-12-03: 120 mg via INTRAVENOUS
  Filled 2014-12-03: qty 12

## 2014-12-03 MED ORDER — SODIUM CHLORIDE 0.9 % IV SOLN
Freq: Once | INTRAVENOUS | Status: AC
  Administered 2014-12-03: 14:00:00 via INTRAVENOUS

## 2014-12-03 MED ORDER — SODIUM CHLORIDE 0.9 % IV SOLN
2.2000 mg/kg | Freq: Once | INTRAVENOUS | Status: DC
  Filled 2014-12-03: qty 13

## 2014-12-03 MED ORDER — SODIUM CHLORIDE 0.9 % IJ SOLN
10.0000 mL | INTRAMUSCULAR | Status: DC | PRN
  Administered 2014-12-03 (×2): 10 mL via INTRAVENOUS
  Filled 2014-12-03: qty 10

## 2014-12-03 NOTE — Patient Instructions (Signed)
[facility] Discharge Instructions for Patients Receiving Chemotherapy  Today you received the following chemotherapy agents Nivolumab.  To help prevent nausea and vomiting after your treatment, we encourage you to take your nausea medication as prescribed.   If you develop nausea and vomiting that is not controlled by your nausea medication, call the clinic.   BELOW ARE SYMPTOMS THAT SHOULD BE REPORTED IMMEDIATELY:  *FEVER GREATER THAN 100.5 F  *CHILLS WITH OR WITHOUT FEVER  NAUSEA AND VOMITING THAT IS NOT CONTROLLED WITH YOUR NAUSEA MEDICATION  *UNUSUAL SHORTNESS OF BREATH  *UNUSUAL BRUISING OR BLEEDING  TENDERNESS IN MOUTH AND THROAT WITH OR WITHOUT PRESENCE OF ULCERS  *URINARY PROBLEMS  *BOWEL PROBLEMS  UNUSUAL RASH Items with * indicate a potential emergency and should be followed up as soon as possible.  Feel free to call the clinic you have any questions or concerns. The clinic phone number is [phone].  Please show the CHEMO ALERT CARD at check-in to the Emergency Department and triage nurse.   

## 2014-12-03 NOTE — Progress Notes (Signed)
OFFICE PROGRESS NOTE   December 03, 2014   Physicians:Emma Rossi/ W.Skeet Latch ,[name], Alfredia Ferguson.Luking (PCP)  INTERVAL HISTORY:  Patient is seen, alone for visit, in continuing attention to metastatic squamous cell carcinoma involving lungs for which she is receiving nivolumab. 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   Patient had cycle 4 nivolumab on 11-19-14, dose increased to ~ 2 mg/kg, which she has tolerated without much difficulty. She needed to use prn steroids x 2 for the nivolumab rash symptoms, these different than her severe chronic dermatitis. TSH was up to 4.27 on 11-19-14, however labs rechecked on 12-02-14 had TSH 2.82 with T3U and T4 normal. She used Flexeril once daily x 6 days for left lateral chest discomfort, so sedated with that medication that she did not eat regularly. She has felt better since stopping that, and the left lateral chest pain is nearly resolved.  She has otherwise felt fairly well, not very active but no SOB or cough, no new pain, appetite better now, no diarrhea, no worsening of right inguinal skin irritation or any new perineal symptoms.   PAC in  [place] Patient presented with gradually enlarging mass at right vulva for ~ 8 months, uncomfortable with direct pressure and ulcerated with occasional bleeding. She was seen in ED on 02-02-14 with right inguinal adenopathy and right vulvar masses, by Dr Elonda Husky on 02-03-14, then by Dr Skeet Latch on 02-05-14, with biopsy of both vulvar mass and satellite lesions on inner thigh and mons. Exam found 7 cm right vulvar mass with 4 cm extension to distal vagina, extending anteriorly to urethral meatus and in ischiorectal area, with satellite lesions inner thigh and right mons. Pathology [phone]) showed invasive squamous cell carcinoma arising in background of high grade squamous intraepithelial  lesion from vulva biopsy and invasive moderately differentiated squamous cell carcinoma from inner thigh lesion. CXR 02-06-14 showed left lower lobe lung mass 3.6 x 3.6 x 3.6 cm, with no adenopathy and no bony lesions, emphysematous changes. CT chest 02-09-14 showed the lower lobe mass 3.7 x 3.5 x 3.3 cm as well as 3 mm RLL nodule and faint 4 mm LUL nodule, no mediastinal or hilar nodes, no axillary or supraclavicular nodes, and upper abdomen not remarkable. PET 02-18-14 measured LLL lung mass 4.6 x 3.9 cm, hypermetabolic, and noted multiple subcentimeter nodules scattered thruout lungs bilaterally; the right vulvar mass was also hypermetabolic, extending up into right side of lower vagina, extensive R>L and right external iliac adenopathy, no uptake in skeleton. 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 She had 3 stereotactic radiation treatments to the LLL pulmonary mass 54 Gy in 3 fractions 10-16, 10-19 and 03-04-14. She received IMRT to vulvar area from 03-09-14, completed 04-23-14, with weekly CDDP x7 03-09-14 thru 04-20-14. Radiation to vulva, vaginal, and pelvic/inguinal nodes was 60 Gy in 30 fractions to gross disease, 54 Gy in 30 fractions to high risk areas, 50.4Gy in 30 fractions to intermediate risk areas. She had vulvar, vaginal, and nodal boost of 6 Gy in 3 fractions to gross disease. Follow up CT chest 05-04-14 had cavitary, spiculated LLL mass 3.3 x 2.3 cm, having been 4.6 x 3.9 cm prior to IMRT, and no change in scattered small bilateral pulmonary nodules, however PET 05-27-14 had progression in bilateral pulmonary nodules and partial response in local vulvar involvement. 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 CT CAP 08-10-14 had majority of lung nodules stable with a few slightly increased, 2 mm hepatic lucencies possibly cysts, no adenopathy AP. She had first nivolumab 09-14-14,  dose reduced by 50% due to concerns for skin toxicity. Nivolumab dose increased to 2 mg/kg starting cycle 4 on 11-19-14.    Review of systems as above, also: No fever or symptoms of infection. No HA or other neurologic symptoms. No flares of chronic dermatitis otherwise. Remainder of 10 point Review of Systems negative.  Objective:  Vital signs in last 24 hours:  BP 107/65 mmHg  Pulse 90  Temp(Src) 98.3 F (36.8 C) (Oral)  Resp 18  Ht 5' 1" (1.549 m)  Wt 121 lb 8 oz (55.112 kg)  BMI 22.97 kg/m2  SpO2 98% Weight down 6 lbs.  Alert, oriented and appropriate, very pleasant as always. Ambulatory without difficulty.   HEENT:PERRL, sclerae not icteric. Oral mucosa moist without lesions, posterior pharynx clear.  Neck supple. No JVD.  Lymphatics:no cervical,supraclavicular adenopathy Resp: BS somewhat diminished bilaterally as baseline, otherwise clear to auscultation bilaterally and normal percussion bilaterally Cardio: regular rate and rhythm. No gallop. GI: soft, nontender, not distended, no mass or organomegaly. Normally active bowel sounds.  Musculoskeletal/ Extremities: without pitting edema, cords, tenderness Neuro: no peripheral neuropathy. Otherwise nonfocal Skin without ecchymosis, petechiae Portacath-without erythema or tenderness  Lab Results:  Results for orders placed or performed in visit on 12/02/14  CBC with Differential  Result Value Ref Range   WBC 7.1 3.9 - 10.3 10e3/uL   NEUT# 5.4 1.5 - 6.5 10e3/uL   HGB 11.4 (L) 11.6 - 15.9 g/dL   HCT 35.3 34.8 - 46.6 %   Platelets 228 145 - 400 10e3/uL   MCV 93.1 79.5 - 101.0 fL   MCH 30.1 25.1 - 34.0 pg   MCHC 32.3 31.5 - 36.0 g/dL   RBC 3.79 3.70 - 5.45 10e6/uL   RDW 13.0 11.2 - 14.5 %   lymph# 0.8 (L) 0.9 - 3.3 10e3/uL   MONO# 0.7 0.1 - 0.9 10e3/uL   Eosinophils Absolute 0.2 0.0 - 0.5 10e3/uL   Basophils Absolute 0.0 0.0 - 0.1 10e3/uL   NEUT% 76.1 38.4 - 76.8 %   LYMPH% 10.9 (L) 14.0 - 49.7 %   MONO% 10.2 0.0 -  14.0 %   EOS% 2.7 0.0 - 7.0 %   BASO% 0.1 0.0 - 2.0 %  Comprehensive metabolic panel (Cmet) - [facility]  Result Value Ref Range   Sodium 134 (L) 136 - 145 mEq/L   Potassium 3.9 3.5 - 5.1 mEq/L   Chloride 100 98 - 109 mEq/L   CO2 24 22 - 29 mEq/L   Glucose 100 70 - 140 mg/dl   BUN 17.7 7.0 - 26.0 mg/dL   Creatinine 1.0 0.6 - 1.1 mg/dL   Total Bilirubin 0.46 0.20 - 1.20 mg/dL   Alkaline Phosphatase 101 40 - 150 U/L   AST 11 5 - 34 U/L   ALT 7 0 - 55 U/L   Total Protein 7.4 6.4 - 8.3 g/dL   Albumin 3.4 (L) 3.5 - 5.0 g/dL   Calcium 10.1 8.4 - 10.4 mg/dL   Anion Gap 10 3 - 11 mEq/L   EGFR 61 (L) >90 ml/min/1.73 m2  TSH-Thyroid Stimulating Hormone  Result Value Ref Range   TSH 2.820 0.308 - 3.960 m(IU)/L  T3 uptake  Result Value Ref Range   T3 Uptake 28  22 - 35 %  T4  Result Value Ref Range   T4, Total 10.8 4.5 - 12.0 ug/dL     Studies/Results:  No results found. Last CXR 11-02-14, last CT CAP 08-10-14.  Medications: I have reviewed the patient's current medications. She has steroids available, which has worked well for dermatitis/ nivolumab rash  DISCUSSION: will keep nivolumab at the slightly higher dose of ~ 2 mg/kg for now. She will see PA with labs and treatment on 12-17-14 and I will see her with Rx 12-31-14. Will repeat CT in Aug. Sees Dr Denman George 01-11-15.  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 Improved local disease with RT/ sensitizing CDDP completed 04-23-14, and additional taxol CDDP thru 07-27-14.  Will get CT CAP prior to seeing Dr Denman George next in August. 2.Squamous cell carcinoma of LLL lung: long past tobacco; imaging and final path suggests primary lung, tho same histology as vulvar primary. Post SBRT to dominant LLL nodule, which is stable, and bilateral pulmonary mets: Nivolumab begun 09-24-14, for cycle 5 today. Doses  reduced from start of treatment due to severe preexisting dermatitis. Repeat CT CAP in August. 3.History of severe dermatitis and eczema since childhood, with severe flares during treatment thus far. Dermatology involved.Nivolumab skin symptoms are different than her usual dermatitis and respond well to prednisone 60 mg daily, which she has needed only for short courses recently. She has the prednisone available to start if needed and let this office know if so. 4.long past tobacco 5.PAC in 6.history migraine HAs. No HA with zofran.  7.allergy PCN. Excessive sedation with recent Flexeril  8.left lateral chest pain seemed musculoskeletal, nothing obvious on CXR. Improved. 9. Advance Directives done. Patient understands that treatment is not expected to be curative.  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 Thrombocytopenia resolved.  .  All questions answered and patient is in agreement with continuing treatment as planned. Nivolumab orders confirmed for today and 12-17-14. She knows to call prior to scheduled visits if needed. Time spent 25 min including >50% counseling and coordination of care.    LIVESAY,LENNIS P, MD   12/03/2014, 2:23 PM

## 2014-12-05 ENCOUNTER — Other Ambulatory Visit: Payer: Self-pay | Admitting: Oncology

## 2014-12-05 DIAGNOSIS — C799 Secondary malignant neoplasm of unspecified site: Secondary | ICD-10-CM

## 2014-12-05 DIAGNOSIS — C3492 Malignant neoplasm of unspecified part of left bronchus or lung: Secondary | ICD-10-CM

## 2014-12-05 DIAGNOSIS — IMO0002 Reserved for concepts with insufficient information to code with codable children: Secondary | ICD-10-CM

## 2014-12-05 DIAGNOSIS — C519 Malignant neoplasm of vulva, unspecified: Secondary | ICD-10-CM

## 2014-12-07 ENCOUNTER — Telehealth: Payer: Self-pay | Admitting: Oncology

## 2014-12-07 NOTE — Telephone Encounter (Signed)
Central radiology schedulers will contact patient re ct. No other orders per 7/23 pof.

## 2014-12-15 ENCOUNTER — Telehealth: Payer: Self-pay

## 2014-12-15 NOTE — Telephone Encounter (Signed)
-----   Message from Gordy Levan, MD sent at 12/05/2014  5:54 PM EDT ----- Please let her know that I have ordered CT CAP to be done after next nivolumab, ~ Aug 15 before I see her on Aug 18. She may prefer water based oral contrast at radiology, as this can be easier on GI tract than the contrast drink at home, but if she wants to drink the contrast at home she can pick it up when she sees Adrena/ has Rx on 8-4-116  thanks

## 2014-12-15 NOTE — Telephone Encounter (Signed)
Ms. Codd will try the water based contrast for the 12-28-14 CT Scan. She knows to arrive in [facility] radiology at~ 0630 to begin drinking the contrast.

## 2014-12-17 ENCOUNTER — Ambulatory Visit (HOSPITAL_BASED_OUTPATIENT_CLINIC_OR_DEPARTMENT_OTHER): Payer: 59

## 2014-12-17 ENCOUNTER — Other Ambulatory Visit (HOSPITAL_BASED_OUTPATIENT_CLINIC_OR_DEPARTMENT_OTHER): Payer: 59

## 2014-12-17 ENCOUNTER — Ambulatory Visit (HOSPITAL_BASED_OUTPATIENT_CLINIC_OR_DEPARTMENT_OTHER): Payer: 59 | Admitting: Physician Assistant

## 2014-12-17 ENCOUNTER — Encounter: Payer: Self-pay | Admitting: Physician Assistant

## 2014-12-17 VITALS — BP 98/75 | HR 65 | Temp 98.0°F | Resp 18 | Ht 61.0 in | Wt 120.3 lb

## 2014-12-17 DIAGNOSIS — Z5112 Encounter for antineoplastic immunotherapy: Secondary | ICD-10-CM | POA: Diagnosis not present

## 2014-12-17 DIAGNOSIS — Z79899 Other long term (current) drug therapy: Secondary | ICD-10-CM

## 2014-12-17 DIAGNOSIS — C3432 Malignant neoplasm of lower lobe, left bronchus or lung: Secondary | ICD-10-CM

## 2014-12-17 DIAGNOSIS — K219 Gastro-esophageal reflux disease without esophagitis: Secondary | ICD-10-CM

## 2014-12-17 DIAGNOSIS — C792 Secondary malignant neoplasm of skin: Secondary | ICD-10-CM

## 2014-12-17 DIAGNOSIS — C541 Malignant neoplasm of endometrium: Secondary | ICD-10-CM | POA: Diagnosis not present

## 2014-12-17 DIAGNOSIS — L270 Generalized skin eruption due to drugs and medicaments taken internally: Secondary | ICD-10-CM

## 2014-12-17 DIAGNOSIS — C7801 Secondary malignant neoplasm of right lung: Secondary | ICD-10-CM

## 2014-12-17 DIAGNOSIS — C7802 Secondary malignant neoplasm of left lung: Principal | ICD-10-CM

## 2014-12-17 DIAGNOSIS — C519 Malignant neoplasm of vulva, unspecified: Secondary | ICD-10-CM

## 2014-12-17 DIAGNOSIS — L309 Dermatitis, unspecified: Secondary | ICD-10-CM

## 2014-12-17 DIAGNOSIS — C778 Secondary and unspecified malignant neoplasm of lymph nodes of multiple regions: Secondary | ICD-10-CM

## 2014-12-17 DIAGNOSIS — C3490 Malignant neoplasm of unspecified part of unspecified bronchus or lung: Secondary | ICD-10-CM

## 2014-12-17 DIAGNOSIS — Z87891 Personal history of nicotine dependence: Secondary | ICD-10-CM

## 2014-12-17 DIAGNOSIS — C3492 Malignant neoplasm of unspecified part of left bronchus or lung: Secondary | ICD-10-CM

## 2014-12-17 DIAGNOSIS — D6481 Anemia due to antineoplastic chemotherapy: Secondary | ICD-10-CM

## 2014-12-17 DIAGNOSIS — IMO0002 Reserved for concepts with insufficient information to code with codable children: Secondary | ICD-10-CM

## 2014-12-17 DIAGNOSIS — R079 Chest pain, unspecified: Secondary | ICD-10-CM

## 2014-12-17 DIAGNOSIS — C799 Secondary malignant neoplasm of unspecified site: Secondary | ICD-10-CM

## 2014-12-17 LAB — CBC WITH DIFFERENTIAL/PLATELET
BASO%: 0.1 % (ref 0.0–2.0)
Basophils Absolute: 0 10*3/uL (ref 0.0–0.1)
EOS%: 0.1 % (ref 0.0–7.0)
Eosinophils Absolute: 0 10*3/uL (ref 0.0–0.5)
HEMATOCRIT: 34.7 % — AB (ref 34.8–46.6)
HGB: 10.9 g/dL — ABNORMAL LOW (ref 11.6–15.9)
LYMPH%: 6.2 % — ABNORMAL LOW (ref 14.0–49.7)
MCH: 29.3 pg (ref 25.1–34.0)
MCHC: 31.4 g/dL — ABNORMAL LOW (ref 31.5–36.0)
MCV: 93.3 fL (ref 79.5–101.0)
MONO#: 0.9 10*3/uL (ref 0.1–0.9)
MONO%: 8.4 % (ref 0.0–14.0)
NEUT%: 85.2 % — ABNORMAL HIGH (ref 38.4–76.8)
NEUTROS ABS: 8.7 10*3/uL — AB (ref 1.5–6.5)
Platelets: 239 10*3/uL (ref 145–400)
RBC: 3.72 10*6/uL (ref 3.70–5.45)
RDW: 13.5 % (ref 11.2–14.5)
WBC: 10.2 10*3/uL (ref 3.9–10.3)
lymph#: 0.6 10*3/uL — ABNORMAL LOW (ref 0.9–3.3)

## 2014-12-17 LAB — TSH CHCC: TSH: 2.725 m[IU]/L (ref 0.308–3.960)

## 2014-12-17 LAB — COMPREHENSIVE METABOLIC PANEL (CC13)
ALBUMIN: 3.2 g/dL — AB (ref 3.5–5.0)
ALT: 12 U/L (ref 0–55)
AST: 9 U/L (ref 5–34)
Alkaline Phosphatase: 75 U/L (ref 40–150)
Anion Gap: 9 mEq/L (ref 3–11)
BUN: 20.3 mg/dL (ref 7.0–26.0)
CALCIUM: 9.9 mg/dL (ref 8.4–10.4)
CO2: 26 mEq/L (ref 22–29)
Chloride: 108 mEq/L (ref 98–109)
Creatinine: 0.8 mg/dL (ref 0.6–1.1)
EGFR: 76 mL/min/{1.73_m2} — ABNORMAL LOW (ref >90)
Glucose: 100 mg/dl (ref 70–140)
POTASSIUM: 3.7 meq/L (ref 3.5–5.1)
Sodium: 142 mEq/L (ref 136–145)
Total Protein: 6.8 g/dL (ref 6.4–8.3)

## 2014-12-17 MED ORDER — HEPARIN SOD (PORK) LOCK FLUSH 100 UNIT/ML IV SOLN
500.0000 [IU] | Freq: Once | INTRAVENOUS | Status: AC | PRN
  Administered 2014-12-17: 500 [IU]
  Filled 2014-12-17: qty 5

## 2014-12-17 MED ORDER — SODIUM CHLORIDE 0.9 % IJ SOLN
10.0000 mL | INTRAMUSCULAR | Status: DC | PRN
  Administered 2014-12-17: 10 mL
  Filled 2014-12-17: qty 10

## 2014-12-17 MED ORDER — SODIUM CHLORIDE 0.9 % IV SOLN
2.1000 mg/kg | Freq: Once | INTRAVENOUS | Status: AC
  Administered 2014-12-17: 120 mg via INTRAVENOUS
  Filled 2014-12-17: qty 12

## 2014-12-17 MED ORDER — SODIUM CHLORIDE 0.9 % IV SOLN
Freq: Once | INTRAVENOUS | Status: AC
  Administered 2014-12-17: 10:00:00 via INTRAVENOUS

## 2014-12-17 NOTE — Patient Instructions (Signed)
[facility] Discharge Instructions for Patients Receiving Chemotherapy  Today you received the following chemotherapy agents Nivolumab.  To help prevent nausea and vomiting after your treatment, we encourage you to take your nausea medication as prescribed.   If you develop nausea and vomiting that is not controlled by your nausea medication, call the clinic.   BELOW ARE SYMPTOMS THAT SHOULD BE REPORTED IMMEDIATELY:  *FEVER GREATER THAN 100.5 F  *CHILLS WITH OR WITHOUT FEVER  NAUSEA AND VOMITING THAT IS NOT CONTROLLED WITH YOUR NAUSEA MEDICATION  *UNUSUAL SHORTNESS OF BREATH  *UNUSUAL BRUISING OR BLEEDING  TENDERNESS IN MOUTH AND THROAT WITH OR WITHOUT PRESENCE OF ULCERS  *URINARY PROBLEMS  *BOWEL PROBLEMS  UNUSUAL RASH Items with * indicate a potential emergency and should be followed up as soon as possible.  Feel free to call the clinic you have any questions or concerns. The clinic phone number is [phone].  Please show the CHEMO ALERT CARD at check-in to the Emergency Department and triage nurse.   

## 2014-12-17 NOTE — Progress Notes (Signed)
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   Patient had cycle 5 nivolumab on 12-03-14, dose increased to ~ 2 mg/kg, which she has tolerated without much difficulty. She continues to use prn steroids x 2 for the nivolumab rash symptoms, these symptoms are different than her severe chronic dermatitis. TSH from today is 2.725. She reports that her left lateral chest pain is not bothering her at this time. Overall she is tolerating her treatment with  Nivolumab. She denied any changes to her baseline shortness of breath and is having no diarrhea. She does have the intermittent pruritus as previously documented but no frank rash. She voiced no other specific complaints. She denied any shortness breath, cough or any new or changes to her baseline pains. Denied any worsening of the right inguinal skin irritation or any new perineal symptoms.    PAC in  [place] Patient presented with gradually enlarging mass at right vulva for ~ 8 months, uncomfortable with direct pressure and ulcerated with occasional bleeding. She was seen in ED on 02-02-14 with right inguinal adenopathy and right vulvar masses, by Dr Elonda Husky on 02-03-14, then by Dr Skeet Latch on 02-05-14, with biopsy of both vulvar mass and satellite lesions on inner thigh and mons. Exam found 7 cm right vulvar mass with 4 cm extension to distal vagina, extending anteriorly to urethral meatus and in ischiorectal area, with satellite lesions inner thigh and right mons. Pathology [phone]) showed invasive squamous cell carcinoma arising in background of high  grade squamous intraepithelial lesion from vulva biopsy and invasive moderately differentiated squamous cell carcinoma from inner thigh lesion. CXR 02-06-14 showed left lower lobe lung mass 3.6 x 3.6 x 3.6 cm, with no adenopathy and no bony lesions, emphysematous changes. CT chest 02-09-14 showed the lower lobe mass 3.7 x 3.5 x 3.3 cm as well as 3 mm RLL nodule and faint 4 mm LUL nodule, no mediastinal or hilar nodes, no axillary or supraclavicular nodes, and upper abdomen not remarkable. PET 02-18-14 measured LLL lung mass 4.6 x 3.9 cm, hypermetabolic, and noted multiple subcentimeter nodules scattered thruout lungs bilaterally; the right vulvar mass was also hypermetabolic, extending up into right side of lower vagina, extensive R>L and right external iliac adenopathy, no uptake in skeleton. 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 She had 3 stereotactic radiation treatments to the LLL pulmonary mass 54 Gy in 3 fractions 10-16, 10-19 and 03-04-14. She received IMRT to vulvar area from 03-09-14, completed 04-23-14, with weekly CDDP x7 03-09-14 thru 04-20-14. Radiation to vulva, vaginal, and pelvic/inguinal nodes was 60 Gy in 30 fractions to gross disease, 54 Gy in 30 fractions to high risk areas, 50.4Gy in 30 fractions to intermediate risk areas. She had vulvar, vaginal, and nodal boost of 6 Gy in 3 fractions to gross disease. Follow up CT chest 05-04-14 had cavitary, spiculated LLL mass 3.3 x 2.3 cm, having been 4.6 x 3.9 cm prior to IMRT, and no change in scattered small bilateral pulmonary nodules, however PET 05-27-14 had progression in bilateral pulmonary nodules and partial response in local vulvar involvement. 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 CT CAP 08-10-14 had majority of lung nodules stable with a few slightly increased, 2 mm hepatic lucencies possibly cysts, no adenopathy AP.  She had first nivolumab 09-14-14, dose reduced by 50% due to concerns for skin toxicity. Nivolumab dose increased to 2 mg/kg starting cycle 4 on 11-19-14.    Review of systems as above, also: No fever or symptoms of infection. No HA or other neurologic symptoms. No flares of chronic dermatitis otherwise. Remainder of 10 point Review of Systems negative.  Objective:  Vital signs in last 24 hours:  BP 98/75 mmHg  Pulse 65  Temp(Src) 98 F (36.7 C) (Oral)  Resp 18  Ht 5' 1"  (1.549 m)  Wt 120 lb 4.8 oz (54.568 kg)  BMI 22.74 kg/m2  SpO2 100% Weight down 6 lbs.  Alert, oriented and appropriate, very pleasant as always. Ambulatory without difficulty.   HEENT:PERRL, sclerae not icteric. Oral mucosa moist without lesions, posterior pharynx clear.  Neck supple. No JVD.  Lymphatics:no cervical,supraclavicular adenopathy Resp: BS somewhat diminished bilaterally as baseline, otherwise clear to auscultation bilaterally and normal percussion bilaterally Cardio: regular rate and rhythm. No gallop. GI: soft, nontender, not distended, no mass or organomegaly. Normally active bowel sounds.  Musculoskeletal/ Extremities: without pitting edema, cords, tenderness Neuro: no peripheral neuropathy. Otherwise nonfocal Skin without ecchymosis, petechiae, no rashes Portacath-without erythema or tenderness  Lab Results:  Results for orders placed or performed in visit on 12/17/14  CBC with Differential  Result Value Ref Range   WBC 10.2 3.9 - 10.3 10e3/uL   NEUT# 8.7 (H) 1.5 - 6.5 10e3/uL   HGB 10.9 (L) 11.6 - 15.9 g/dL   HCT 34.7 (L) 34.8 - 46.6 %   Platelets 239 145 - 400 10e3/uL   MCV 93.3 79.5 - 101.0 fL   MCH 29.3 25.1 - 34.0 pg   MCHC 31.4 (L) 31.5 - 36.0 g/dL   RBC 3.72 3.70 - 5.45 10e6/uL   RDW 13.5 11.2 - 14.5 %   lymph# 0.6 (L) 0.9 - 3.3 10e3/uL   MONO# 0.9 0.1 - 0.9 10e3/uL   Eosinophils Absolute 0.0 0.0 - 0.5 10e3/uL   Basophils Absolute 0.0 0.0 - 0.1 10e3/uL   NEUT% 85.2 (H) 38.4 -  76.8 %   LYMPH% 6.2 (L) 14.0 - 49.7 %   MONO% 8.4 0.0 - 14.0 %   EOS% 0.1 0.0 - 7.0 %   BASO% 0.1 0.0 - 2.0 %  Comprehensive metabolic panel (Cmet) - [facility]  Result Value Ref Range   Sodium 142 136 - 145 mEq/L   Potassium 3.7 3.5 - 5.1 mEq/L   Chloride 108 98 - 109 mEq/L   CO2 26 22 - 29 mEq/L   Glucose 100 70 - 140 mg/dl   BUN 20.3 7.0 - 26.0 mg/dL   Creatinine 0.8 0.6 - 1.1 mg/dL   Total Bilirubin <0.20 0.20 - 1.20 mg/dL   Alkaline Phosphatase 75 40 - 150 U/L   AST 9 5 - 34 U/L   ALT 12 0 - 55 U/L   Total Protein 6.8 6.4 - 8.3 g/dL   Albumin 3.2 (L) 3.5 - 5.0 g/dL   Calcium 9.9 8.4 - 10.4 mg/dL   Anion Gap 9 3 - 11 mEq/L   EGFR 76 (L) >90 ml/min/1.73 m2  TSH-Thyroid Stimulating Hormone  Result Value Ref Range   TSH 2.725 0.308 - 3.960 m(IU)/L     Studies/Results:  No results  found. Last CXR 11-02-14, last CT CAP 08-10-14.  Medications: I have reviewed the patient's current medications. She has steroids available, which has worked well for dermatitis/ nivolumab rash  DISCUSSION: will keep nivolumab at the slightly higher dose of ~ 2 mg/kg for now. She will see Dr. Marko Plume with Rx 12-31-14 with restaging CT scan to re-evaluate her disease. Sees Dr Denman George 01-11-15.  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 Improved local disease with RT/ sensitizing CDDP completed 04-23-14, and additional taxol CDDP thru 07-27-14.  Will get CT CAP prior to seeing Dr Denman George next in August. 2.Squamous cell carcinoma of LLL lung: long past tobacco; imaging and final path suggests primary lung, tho same histology as vulvar primary. Post SBRT to dominant LLL nodule, which is stable, and bilateral pulmonary mets: Nivolumab begun 09-24-14, for cycle 5 today. Doses reduced from start of treatment due to severe preexisting dermatitis. Repeat CT CAP in August.  Scheduled for 12/28/2014. 3.History of severe dermatitis and eczema since childhood, with severe flares during treatment thus far. Dermatology involved.Nivolumab skin symptoms are different than her usual dermatitis and respond well to prednisone 60 mg daily, which she has needed only for short courses recently. Not currently taking. She has the prednisone available to start if needed and let this office know if so. 4.long past tobacco 5.PAC in 6.history migraine HAs. No HA with zofran.  7.allergy PCN. Excessive sedation with recent Flexeril  8.left lateral chest pain seemed musculoskeletal, nothing obvious on CXR. Improved. 9. Advance Directives done. Patient understands that treatment is not expected to be curative.  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 Thrombocytopenia resolved.  .  All questions answered and patient is in agreement with continuing treatment as planned. Nivolumab orders confirmed for today and 12-17-14. She knows to call prior to scheduled visits if needed. Time spent 25 min including >50% counseling and coordination of care.    Jody Beard, Shaquana Buel E, PA-C   12/17/2014, 1:25 PM

## 2014-12-17 NOTE — Patient Instructions (Addendum)
Follow up in 2 weeks with a restaging CT scan to re-evaluate your disease

## 2014-12-28 ENCOUNTER — Encounter (HOSPITAL_COMMUNITY): Payer: Self-pay

## 2014-12-28 ENCOUNTER — Telehealth: Payer: Self-pay | Admitting: *Deleted

## 2014-12-28 ENCOUNTER — Ambulatory Visit (HOSPITAL_COMMUNITY)
Admission: RE | Admit: 2014-12-28 | Discharge: 2014-12-28 | Disposition: A | Discharge status: Home / Self Care | Payer: 59 | Source: Ambulatory Visit | Attending: Oncology | Admitting: Oncology

## 2014-12-28 DIAGNOSIS — C519 Malignant neoplasm of vulva, unspecified: Secondary | ICD-10-CM

## 2014-12-28 DIAGNOSIS — C799 Secondary malignant neoplasm of unspecified site: Secondary | ICD-10-CM

## 2014-12-28 DIAGNOSIS — Z923 Personal history of irradiation: Secondary | ICD-10-CM | POA: Diagnosis not present

## 2014-12-28 DIAGNOSIS — C649 Malignant neoplasm of unspecified kidney, except renal pelvis: Secondary | ICD-10-CM | POA: Diagnosis not present

## 2014-12-28 DIAGNOSIS — C7800 Secondary malignant neoplasm of unspecified lung: Secondary | ICD-10-CM | POA: Diagnosis not present

## 2014-12-28 DIAGNOSIS — K769 Liver disease, unspecified: Secondary | ICD-10-CM | POA: Diagnosis not present

## 2014-12-28 DIAGNOSIS — IMO0002 Reserved for concepts with insufficient information to code with codable children: Secondary | ICD-10-CM

## 2014-12-28 DIAGNOSIS — R918 Other nonspecific abnormal finding of lung field: Secondary | ICD-10-CM | POA: Diagnosis not present

## 2014-12-28 DIAGNOSIS — C3492 Malignant neoplasm of unspecified part of left bronchus or lung: Secondary | ICD-10-CM

## 2014-12-28 MED ORDER — IOHEXOL 300 MG/ML  SOLN
100.0000 mL | Freq: Once | INTRAMUSCULAR | Status: AC | PRN
  Administered 2014-12-28: 100 mL via INTRAVENOUS

## 2014-12-28 MED ORDER — IOHEXOL 300 MG/ML  SOLN
50.0000 mL | Freq: Once | INTRAMUSCULAR | Status: AC | PRN
  Administered 2014-12-28: 50 mL via ORAL

## 2014-12-28 NOTE — Telephone Encounter (Signed)
THE REPORT WAS READ AND FAXED TO TRIAGE. NOTIFIED DR.LIVESAY'S NURSE, LACIE BURTON,RN. THE REPORT WAS PLACED ON DR.LIVESAY'S DESK.

## 2014-12-30 ENCOUNTER — Other Ambulatory Visit: Payer: Self-pay | Admitting: Oncology

## 2014-12-31 ENCOUNTER — Encounter: Payer: Self-pay | Admitting: Oncology

## 2014-12-31 ENCOUNTER — Ambulatory Visit (HOSPITAL_BASED_OUTPATIENT_CLINIC_OR_DEPARTMENT_OTHER): Payer: 59

## 2014-12-31 ENCOUNTER — Ambulatory Visit (HOSPITAL_BASED_OUTPATIENT_CLINIC_OR_DEPARTMENT_OTHER): Payer: 59 | Admitting: Oncology

## 2014-12-31 ENCOUNTER — Other Ambulatory Visit (HOSPITAL_BASED_OUTPATIENT_CLINIC_OR_DEPARTMENT_OTHER): Payer: 59

## 2014-12-31 VITALS — BP 112/59 | HR 85 | Temp 98.3°F | Resp 17 | Ht 61.0 in | Wt 121.5 lb

## 2014-12-31 DIAGNOSIS — C3492 Malignant neoplasm of unspecified part of left bronchus or lung: Secondary | ICD-10-CM

## 2014-12-31 DIAGNOSIS — Z79899 Other long term (current) drug therapy: Secondary | ICD-10-CM | POA: Diagnosis not present

## 2014-12-31 DIAGNOSIS — D6481 Anemia due to antineoplastic chemotherapy: Secondary | ICD-10-CM

## 2014-12-31 DIAGNOSIS — C799 Secondary malignant neoplasm of unspecified site: Secondary | ICD-10-CM

## 2014-12-31 DIAGNOSIS — C3432 Malignant neoplasm of lower lobe, left bronchus or lung: Secondary | ICD-10-CM

## 2014-12-31 DIAGNOSIS — IMO0002 Reserved for concepts with insufficient information to code with codable children: Secondary | ICD-10-CM

## 2014-12-31 DIAGNOSIS — C541 Malignant neoplasm of endometrium: Secondary | ICD-10-CM | POA: Diagnosis not present

## 2014-12-31 DIAGNOSIS — C7802 Secondary malignant neoplasm of left lung: Secondary | ICD-10-CM

## 2014-12-31 DIAGNOSIS — Z95828 Presence of other vascular implants and grafts: Secondary | ICD-10-CM

## 2014-12-31 DIAGNOSIS — C3411 Malignant neoplasm of upper lobe, right bronchus or lung: Secondary | ICD-10-CM

## 2014-12-31 DIAGNOSIS — K219 Gastro-esophageal reflux disease without esophagitis: Secondary | ICD-10-CM

## 2014-12-31 DIAGNOSIS — C7801 Secondary malignant neoplasm of right lung: Secondary | ICD-10-CM

## 2014-12-31 DIAGNOSIS — C778 Secondary and unspecified malignant neoplasm of lymph nodes of multiple regions: Secondary | ICD-10-CM

## 2014-12-31 DIAGNOSIS — L309 Dermatitis, unspecified: Secondary | ICD-10-CM

## 2014-12-31 DIAGNOSIS — C519 Malignant neoplasm of vulva, unspecified: Secondary | ICD-10-CM

## 2014-12-31 DIAGNOSIS — Z5112 Encounter for antineoplastic immunotherapy: Secondary | ICD-10-CM

## 2014-12-31 DIAGNOSIS — L259 Unspecified contact dermatitis, unspecified cause: Secondary | ICD-10-CM

## 2014-12-31 DIAGNOSIS — C792 Secondary malignant neoplasm of skin: Secondary | ICD-10-CM

## 2014-12-31 LAB — CBC WITH DIFFERENTIAL/PLATELET
BASO%: 1.1 % (ref 0.0–2.0)
BASOS ABS: 0.1 10*3/uL (ref 0.0–0.1)
EOS%: 3.1 % (ref 0.0–7.0)
Eosinophils Absolute: 0.2 10*3/uL (ref 0.0–0.5)
HCT: 34.4 % — ABNORMAL LOW (ref 34.8–46.6)
HGB: 11 g/dL — ABNORMAL LOW (ref 11.6–15.9)
LYMPH%: 7.7 % — AB (ref 14.0–49.7)
MCH: 28.6 pg (ref 25.1–34.0)
MCHC: 32 g/dL (ref 31.5–36.0)
MCV: 89.3 fL (ref 79.5–101.0)
MONO#: 0.4 10*3/uL (ref 0.1–0.9)
MONO%: 7.8 % (ref 0.0–14.0)
NEUT#: 4.5 10*3/uL (ref 1.5–6.5)
NEUT%: 80.3 % — AB (ref 38.4–76.8)
Platelets: 287 10*3/uL (ref 145–400)
RBC: 3.85 10*6/uL (ref 3.70–5.45)
RDW: 14.8 % — ABNORMAL HIGH (ref 11.2–14.5)
WBC: 5.5 10*3/uL (ref 3.9–10.3)
lymph#: 0.4 10*3/uL — ABNORMAL LOW (ref 0.9–3.3)

## 2014-12-31 LAB — COMPREHENSIVE METABOLIC PANEL (CC13)
ALT: 10 U/L (ref 0–55)
AST: 12 U/L (ref 5–34)
Albumin: 3.2 g/dL — ABNORMAL LOW (ref 3.5–5.0)
Alkaline Phosphatase: 110 U/L (ref 40–150)
Anion Gap: 12 mEq/L — ABNORMAL HIGH (ref 3–11)
BUN: 11.2 mg/dL (ref 7.0–26.0)
CALCIUM: 10.4 mg/dL (ref 8.4–10.4)
CHLORIDE: 103 meq/L (ref 98–109)
CO2: 24 meq/L (ref 22–29)
Creatinine: 1 mg/dL (ref 0.6–1.1)
EGFR: 62 mL/min/{1.73_m2} — ABNORMAL LOW (ref >90)
GLUCOSE: 98 mg/dL (ref 70–140)
POTASSIUM: 4.1 meq/L (ref 3.5–5.1)
SODIUM: 139 meq/L (ref 136–145)
Total Bilirubin: 0.38 mg/dL (ref 0.20–1.20)
Total Protein: 7 g/dL (ref 6.4–8.3)

## 2014-12-31 LAB — TSH CHCC: TSH: 2.552 m[IU]/L (ref 0.308–3.960)

## 2014-12-31 LAB — TECHNOLOGIST REVIEW

## 2014-12-31 MED ORDER — SODIUM CHLORIDE 0.9 % IJ SOLN
10.0000 mL | INTRAMUSCULAR | Status: DC | PRN
  Administered 2014-12-31: 10 mL
  Filled 2014-12-31: qty 10

## 2014-12-31 MED ORDER — HEPARIN SOD (PORK) LOCK FLUSH 100 UNIT/ML IV SOLN
500.0000 [IU] | Freq: Once | INTRAVENOUS | Status: AC | PRN
  Filled 2014-12-31: qty 5

## 2014-12-31 MED ORDER — HEPARIN SOD (PORK) LOCK FLUSH 100 UNIT/ML IV SOLN
250.0000 [IU] | Freq: Once | INTRAVENOUS | Status: AC | PRN
  Administered 2014-12-31: 500 [IU]
  Filled 2014-12-31: qty 5

## 2014-12-31 MED ORDER — SODIUM CHLORIDE 0.9 % IV SOLN: 3.0000 mg/kg | Freq: Once | INTRAVENOUS | Status: DC

## 2014-12-31 MED ORDER — SODIUM CHLORIDE 0.9 % IV SOLN
Freq: Once | INTRAVENOUS | Status: AC
  Administered 2014-12-31: 14:00:00 via INTRAVENOUS

## 2014-12-31 MED ORDER — SODIUM CHLORIDE 0.9 % IV SOLN
2.9000 mg/kg | Freq: Once | INTRAVENOUS | Status: AC
  Administered 2014-12-31: 160 mg via INTRAVENOUS
  Filled 2014-12-31: qty 16

## 2014-12-31 NOTE — Patient Instructions (Signed)
[facility] Discharge Instructions for Patients  Today you received the following: Nivolumab   To help prevent nausea and vomiting after your treatment, we encourage you to take your nausea medication as directed.    If you develop nausea and vomiting that is not controlled by your nausea medication, call the clinic.   BELOW ARE SYMPTOMS THAT SHOULD BE REPORTED IMMEDIATELY:  *FEVER GREATER THAN 100.5 F  *CHILLS WITH OR WITHOUT FEVER  NAUSEA AND VOMITING THAT IS NOT CONTROLLED WITH YOUR NAUSEA MEDICATION  *UNUSUAL SHORTNESS OF BREATH  *UNUSUAL BRUISING OR BLEEDING  TENDERNESS IN MOUTH AND THROAT WITH OR WITHOUT PRESENCE OF ULCERS  *URINARY PROBLEMS  *BOWEL PROBLEMS  UNUSUAL RASH Items with * indicate a potential emergency and should be followed up as soon as possible.  Feel free to call the clinic you have any questions or concerns. The clinic phone number is [phone].  Please show the CHEMO ALERT CARD at check-in to the Emergency Department and triage nurse.   

## 2014-12-31 NOTE — Progress Notes (Signed)
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 She completed radiation and chemo (sensitizing CDDP then CDDP/taxol thru 07-27-14) with good response in vulvar area. She has received nivolumab x 6 cycles from 09-14-14 thru 12-17-14, doses reduced due to severe chronic dermatitis but tolerated with increase to 2.1 mg/kg last treatments.  She had restaging CT CAP 12-28-14, which shows progression of pulmonary lesions compared to 08-10-14 CT as well as possible small hepatic lesions. She is to see Dr Denman George on 01-11-15.   Patient reports that she continues to feel well. She denies SOB, cough, sputum, hemoptysis. The left lateral chest pain is much better. She has no new or different pain. The vulvar area is not uncomfortable, skin in right groin area also improved. She did not need any steroids after most recent nivolumab. Appetite is fairly good, no nausea or vomiting, no abdominal pain, bowels ok. Energy reasonably good.   PAC in   [place]   Patient presented with gradually enlarging mass at right vulva for ~ 8 months, uncomfortable with direct pressure and ulcerated with occasional bleeding. She was seen in ED on 02-02-14 with right inguinal adenopathy and right vulvar masses, by Dr Elonda Husky on 02-03-14, then by Dr Skeet Latch on 02-05-14, with biopsy of both vulvar mass and satellite lesions on inner thigh and mons. Exam found 7 cm right vulvar mass with 4 cm extension to distal vagina, extending anteriorly to urethral meatus and in ischiorectal area, with satellite lesions inner thigh and right mons. Pathology [id number]) showed invasive squamous  cell carcinoma arising in background of high grade squamous intraepithelial lesion from vulva biopsy and invasive moderately differentiated squamous cell carcinoma from inner thigh lesion. CXR 02-06-14 showed left lower lobe lung mass 3.6 x 3.6 x 3.6 cm, with no adenopathy and no bony lesions, emphysematous changes. CT chest 02-09-14 showed the lower lobe mass 3.7 x 3.5 x 3.3 cm as well as 3 mm RLL nodule and faint 4 mm LUL nodule, no mediastinal or hilar nodes, no axillary or supraclavicular nodes, and upper abdomen not remarkable. PET 02-18-14 measured LLL lung mass 4.6 x 3.9 cm, hypermetabolic, and noted multiple subcentimeter nodules scattered thruout lungs bilaterally; the right vulvar mass was also hypermetabolic, extending up into right side of lower vagina, extensive R>L and right external iliac adenopathy, no uptake in skeleton. 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 She had 3 stereotactic radiation treatments to the LLL pulmonary mass 54 Gy in 3 fractions 10-16, 10-19 and 03-04-14. She received IMRT to vulvar area from 03-09-14, completed 04-23-14, with weekly CDDP x7 03-09-14 thru 04-20-14. Radiation to vulva, vaginal, and pelvic/inguinal nodes was 60 Gy in 30 fractions to gross disease, 54 Gy in 30 fractions to high risk areas, 50.4Gy in 30 fractions to intermediate risk areas. She had vulvar, vaginal, and nodal boost of 6 Gy in 3 fractions to gross disease. Follow up CT chest 05-04-14 had cavitary, spiculated LLL mass 3.3 x 2.3 cm, having been 4.6 x 3.9 cm prior to IMRT, and no change in scattered small bilateral pulmonary nodules, however PET 05-27-14 had progression in bilateral pulmonary nodules and partial response  in local vulvar involvement. 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 CT CAP 08-10-14 had majority of lung nodules stable with a few slightly increased, 2 mm hepatic  lucencies possibly cysts, no adenopathy AP. She had first nivolumab 09-14-14, dose reduced by 50% due to concerns for skin toxicity. Nivolumab dose increased to 2 mg/kg starting cycle 4 on 11-19-14     Review of systems as above, also: No fever or symptoms of infection. No flare of chronic dermatitis in past few weeks. No symptoms of hyper or hypothyroidism obvious. Bladder ok. No bleeding.  Remainder of 10 point Review of Systems negative.  Objective:  Vital signs in last 24 hours:  BP 112/59 mmHg  Pulse 85  Temp(Src) 98.3 F (36.8 C) (Oral)  Resp 17  Ht 5' 1"  (1.549 m)  Wt 121 lb 8 oz (55.112 kg)  BMI 22.97 kg/m2  SpO2 98% Weight up 1 lb Alert, oriented and appropriate, looks comfortable. Ambulatory without difficulty, comfortable sitting flat in chair. Respirations not labored with activity in exam room  Hair has grown back curly.  HEENT:PERRL, sclerae not icteric. Oral mucosa moist without lesions, posterior pharynx clear.  Neck supple. No JVD.  Lymphatics:no cervical,suraclavicular, axillary or inguinal adenopathy Resp: clear to auscultation bilaterally and normal percussion bilaterally Cardio: regular rate and rhythm. No gallop. GI: soft, nontender, not distended, no mass or organomegaly. Normally active bowel sounds. Musculoskeletal/ Extremities: without pitting edema, cords, tenderness Neuro: no peripheral neuropathy. Otherwise nonfocal. Psych appropriate mood and affect Skin without ecchymosis, petechiae.No significant chronic eczema rash or other rash. Mild erythema right inguinal area improved, no desquamation. No skin changes later left chest wall. Portacath-without erythema or tenderness  Lab Results:  Results for orders placed or performed in visit on 12/31/14  TECHNOLOGIST REVIEW  Result Value Ref Range   Technologist Review Occ Metas and Myelocytes present   CBC with Differential  Result Value Ref Range   WBC 5.5 3.9 - 10.3 10e3/uL   NEUT# 4.5 1.5 - 6.5  10e3/uL   HGB 11.0 (L) 11.6 - 15.9 g/dL   HCT 34.4 (L) 34.8 - 46.6 %   Platelets 287 145 - 400 10e3/uL   MCV 89.3 79.5 - 101.0 fL   MCH 28.6 25.1 - 34.0 pg   MCHC 32.0 31.5 - 36.0 g/dL   RBC 3.85 3.70 - 5.45 10e6/uL   RDW 14.8 (H) 11.2 - 14.5 %   lymph# 0.4 (L) 0.9 - 3.3 10e3/uL   MONO# 0.4 0.1 - 0.9 10e3/uL   Eosinophils Absolute 0.2 0.0 - 0.5 10e3/uL   Basophils Absolute 0.1 0.0 - 0.1 10e3/uL   NEUT% 80.3 (H) 38.4 - 76.8 %   LYMPH% 7.7 (L) 14.0 - 49.7 %   MONO% 7.8 0.0 - 14.0 %   EOS% 3.1 0.0 - 7.0 %   BASO% 1.1 0.0 - 2.0 %  Comprehensive metabolic panel (Cmet) - [facility]  Result Value Ref Range   Sodium 139 136 - 145 mEq/L   Potassium 4.1 3.5 - 5.1 mEq/L   Chloride 103 98 - 109 mEq/L   CO2 24 22 - 29 mEq/L   Glucose 98 70 - 140 mg/dl   BUN 11.2 7.0 - 26.0 mg/dL   Creatinine 1.0 0.6 - 1.1 mg/dL   Total Bilirubin 0.38 0.20 - 1.20 mg/dL   Alkaline Phosphatase 110 40 - 150 U/L   AST 12 5 - 34 U/L   ALT 10 0 - 55  U/L   Total Protein 7.0 6.4 - 8.3 g/dL   Albumin 3.2 (L) 3.5 - 5.0 g/dL   Calcium 10.4 8.4 - 10.4 mg/dL   Anion Gap 12 (H) 3 - 11 mEq/L   EGFR 62 (L) >90 ml/min/1.73 m2  TSH-Thyroid Stimulating Hormone  Result Value Ref Range   TSH 2.552 0.308 - 3.960 m(IU)/L     Studies/Results: EXAM: CT CHEST, ABDOMEN, AND PELVIS WITH CONTRAST  12-28-14  COMPARISON: CT 08/10/2014  FINDINGS: CT CHEST FINDINGS  Mediastinum/Nodes: No axillary lymphadenopathy. There is thickening along the LEFT chest wall beneath the tip of the scapula inferior to the axilla within the muscular layer measuring 11 mm on image 37, series 2. This is similar to comparison CT exam but may be new from PET-CT scan are 05/27/2014.  No central pulmonary embolism. No mediastinal lymphadenopathy.  Lungs/Pleura: Multiple new and enlarged pulmonary nodules within the LEFT and RIGHT lungs. RIGHT upper lobe nodule measures 16 mm compared to 5 mm (image number 14). 24 mm nodule at the RIGHT  lung base on image 53 is increased from 3 mm. Adjacent 7 mm nodule is new from prior. Similar new and large nodules within the LEFT lung. There is infiltrative process in the lateral LEFT lower lobe at site of prior thick-walled cavitary lesion (image 40, series 5)  CT ABDOMEN AND PELVIS FINDINGS  Hepatobiliary: Several small low-density hepatic lesions are new. For example 6 mm lesion the RIGHT hepatic lobe on image 65, series 2 is not seen on prior. 5 mm lesion the posterior LEFT hepatic lobe image 57 is not seen on prior. 4 mm lesion in the inferior RIGHT hepatic lobe on image 69 is not seen on prior. The smaller hypodense described on comparison CT are not well demonstrated. Normal gallbladder.  Pancreas: Pancreas is normal. No ductal dilatation. No pancreatic inflammation.  Spleen: Normal spleen  Adrenals/urinary tract: Adrenal glands normal. Small nonobstructing calculus the RIGHT kidney.  Stomach/Bowel: Stomach, small bowel, appendix, and cecum are normal. The colon and rectosigmoid colon are normal.  Vascular/Lymphatic: Abdominal aorta is normal caliber. There is no retroperitoneal or periportal lymphadenopathy. No pelvic lymphadenopathy.  Reproductive: Uterus and adnexa are normal. Some haziness in the fat of the lower of pelvis likely related to radiation.  Musculoskeletal: No aggressive osseous lesion.  Other: No free fluid.  IMPRESSION: Chest Impression:  1. Unfortunately, there is marked progression of pulmonary metastasis with multiple new and enlarged pulmonary nodules within the LEFT and RIGHT lung. There is infiltrative progression in the LEFT lower lobe. 2. Thickening in the musculature of the lateral LEFT chest wall, iinferior to scapula tip could represent a soft tissue muscle metastasis.  Abdomen / Pelvis Impression:  1. Several new low-density lesions within the liver are concerning for hepatic metastasis. 2. No lymphadenopathy  in the abdomen or pelvis.    CT report discussed and images reviewed on PACs with patient and daughter  Medications: I have reviewed the patient's current medications. She will fill steroid prescription to have available at home  DISCUSSION: Patient understands that CT shows progression in lungs and possible new liver lesions compared with scan done 5 weeks prior to starting dose reduced nivolumab; pelvis area does not seem to show progression. She had some dermatitis issues with nivolumab initially, but now is tolerating well and is rather remarkably asymptomatic. She does want to continue some type of treatment. Discussed change to another chemo regimen (navelbine consideration due to usefulness in squamous cell lung and vulvar cancers,  d1, d8 q 21 days). Patient requests trying to increase nivolumab to full dose of 3 mg/kg now, with close follow up. I have reminded patient and daughter that ~ 50% squamous cell lung cancers do not respond to nivolumab (up to 23% response rate and up to 27% stable disease). I will see her back with CXR 9-1 and 9-15.  She will keep appointment with Dr Denman George on 01-11-15 as scheduled.  Other regimens for metastatic vulvar carcinoma: carbo taxol, gemzar, q 3 week Alimta. Avastin contraindicated with squamous cell ca lung; alimta generally maintenance for lung.   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 Improved local disease with RT/ sensitizing CDDP completed 04-23-14, and additional taxol CDDP thru 07-27-14. Pelvic CT does not suggest local progression. To see Dr Denman George 01-11-15. 2.Squamous cell carcinoma of LLL lung: long past tobacco; imaging and final path suggests primary lung, tho same histology as vulvar primary. Post SBRT to dominant LLL mass. Comparison imaging was done ~ 5 weeks prior to starting nivolumab,  which was dose reduced. WIll give nivolumab at full dose now, following labs and CXR.  3.History of severe dermatitis and eczema since childhood, with severe flares during initial nivolumab treatment. Dermatology involved.Nivolumab skin symptoms are different than her usual dermatitis and respond well to prednisone 60 mg daily, which she has needed only for short courses recently. She has the prednisone available to start if needed and let this office know if so. 4.long past tobacco 5.PAC in 6.history migraine HAs. No HA with zofran.  7.allergy PCN. Excessive sedation with recent Flexeril  8.left lateral chest pain much improved, soft tissue thickening on CT likely corresponding, tho symptoms do not suggest progressive disease now  9. Advance Directives done. Patient understands that treatment is not expected to be curative.  10.increased GERD controlled with protonix daily  11.anemia related to chemo and pelvic RT: improved, continue oral iron. Thrombocytopenia resolved.   Chemo orders adjusted, pharmacy notified. All questions answered. Patient and daughter know to call if needed prior to next scheduled visit. Time spent 40 min including >50% counseling and coordination of care. Cc Drs Carolann Littler, Luking    Gordy Levan, MD   12/31/2014, 5:22 PM

## 2015-01-01 ENCOUNTER — Telehealth: Payer: Self-pay | Admitting: Oncology

## 2015-01-01 NOTE — Telephone Encounter (Signed)
Called patient and lefta message with her upcoming appoinments

## 2015-01-01 NOTE — Telephone Encounter (Signed)
Called patient back and advised of cxr as well   anne

## 2015-01-04 ENCOUNTER — Telehealth: Payer: Self-pay | Admitting: Oncology

## 2015-01-04 NOTE — Telephone Encounter (Signed)
FAXED PT OFFICE NOTE TO DR Allyn Kenner [phone] OFFICE

## 2015-01-10 ENCOUNTER — Other Ambulatory Visit: Payer: Self-pay | Admitting: Oncology

## 2015-01-11 ENCOUNTER — Ambulatory Visit (HOSPITAL_BASED_OUTPATIENT_CLINIC_OR_DEPARTMENT_OTHER): Payer: 59 | Admitting: Gynecologic Oncology

## 2015-01-11 ENCOUNTER — Encounter: Payer: Self-pay | Admitting: Gynecologic Oncology

## 2015-01-11 ENCOUNTER — Ambulatory Visit (HOSPITAL_COMMUNITY)
Admission: RE | Admit: 2015-01-11 | Discharge: 2015-01-11 | Disposition: A | Discharge status: Home / Self Care | Payer: 59 | Source: Ambulatory Visit | Attending: Oncology | Admitting: Oncology

## 2015-01-11 VITALS — BP 113/70 | HR 103 | Temp 98.3°F | Resp 16 | Ht 61.0 in | Wt 114.1 lb

## 2015-01-11 DIAGNOSIS — C519 Malignant neoplasm of vulva, unspecified: Secondary | ICD-10-CM | POA: Diagnosis not present

## 2015-01-11 DIAGNOSIS — C3432 Malignant neoplasm of lower lobe, left bronchus or lung: Secondary | ICD-10-CM | POA: Insufficient documentation

## 2015-01-11 DIAGNOSIS — C799 Secondary malignant neoplasm of unspecified site: Secondary | ICD-10-CM | POA: Insufficient documentation

## 2015-01-11 DIAGNOSIS — IMO0002 Reserved for concepts with insufficient information to code with codable children: Secondary | ICD-10-CM

## 2015-01-11 NOTE — Patient Instructions (Signed)
Followup in 3 months with Dr. Denman George as scheduled above. Please call our office with any questions or concerns prior to this appointment.  Try over the counter Miralax to help move your bowels and increase the amount of water you are drinking.

## 2015-01-11 NOTE — Progress Notes (Signed)
Followup Note: Gyn-Onc  CC: Dr. Elonda Husky    CC:  Chief Complaint  Patient presents with  . Vulvar Cancer    follow-up    Assessment/Plan:  Jody Beard  is a 64 y.o.  With a history of stage IV vulvar ca (SCC) and  second primary lung cancer who is s/p completing primary chemoradiation with persistent disease in the chest. She then completed an additional 5 cycles of salvage cddp and paclitaxel with mixed response (in the chest). She had a complete pathologic response at the vulva. Her pulmonary disease is progressing on dose reduced nivolumab (dose reduced secondary to dermal toxicity).  I agree with the plan to treat the primary lung cancer which is progressive with nivolumab.   She is NED at the vulva and we will monitor for recurrence in 3 months.  HPI:   Jody Beard is a 64 year old woman with a history of noticing a right vulvar mass that had been progressing since approximately fabric 2015. In September 2015 she was seen by Dr. Elonda Husky who performed a biopsy which confirmed squamous cell carcinoma of the vulva. The disease was extremely extensive on the vulva, with a 7cm vulvar lesion that extended additionally into the 4cmof the distal vagina,  the vaginal submucosa and the urethra.  Satellite lesions appreciated on the inner thigh and mons.  Bilateral inguinal adenopathy noted.  During preoperative evaluation for a planned inguinal lymphadenectomy, and chest x-ray revealed a large 3 cm solid lung mass. A PET/CT was performed and was demonstrated extensive uptake in an enlarged inguinal and pelvic lymph nodes, as well as a left lower lobe lung mass that was avid and multiple bilateral pulmonary nodules. The chest mass was biopsied and confirmed to be squamous cell carcinoma. It was unclear if it was a second primary lung cancer, or a metastasis from her vulva.   Treatment planning determined that she would receive primary chemoradiation to control the disease at the vulva, inguinal and pelvic  nodes, with cisplatin radiosensitizing chemotherapy. Her radiation treatment dates were between 03/09/2014 and 04/23/2014. She received 60 gray to the vulvovaginal and pelvic inguinal nodes, with a vulvovaginal and nodal boost using IMRT. She received concurrent radiosensitizing cisplatin chemotherapy.  She tolerated the radiation treatment relatively well with excellent clinical tumor regression on physical examination.  On 05/27/14 she received a PET scan which revealed good response in the pelvis, vulvar and left lower lobe, however there was interval increase in number and size of the numerous bilateral pulmonary nodules suggesting a mixed response to initial therapy.   Biopsy of the vulva (post-treatment) in January, 2016 showed no microscopic disease (a complete pathologic response at the vulva).  She then received 5 additional cycles of cddp and paclitaxel completed in March, 2016. CT of the chest abdomen and pelvis on 08/10/14 showed stable disease in most chest nodules, with slight increase in size in 2 nodules and a stable dominant lesion. She had no new nodal disease or abdo/pelvis disease.  She has been managing severe dermatitis (whole body) since beginning therapy and is seeing a dermatologist who has prescribed steroids for different regions of the body and vegetable oil as an emolient.   INTERVAL HX: The itch is overall improved. She had a CT of the chest, abdomen and pelvis on 12/28/14 which showed no new disease in the groin, abdomen or pelvis, but progression of the lung lesions with multiple new or enlarged lesions bilaterally. Additionally, there were new pulmonary metastatic lesions noted. She had been  on dose reduced Nivolumab due to dermal toxicity, but has, after the CT been changed to a higher dose now that her rash has resolved. She is thus far tolerating this.    Review of Systems:  Constitutional  Feels well,  Cardiovascular  No chest pain, shortness of breath, or edema   Pulmonary  No cough or wheeze.  Gastro Intestinal  No nausea, vomitting, or diarrhoea. No bright red blood per rectum, no abdominal pain, change in bowel movement, or constipation.  Genito Urinary  No frequency, urgency, dysuria,.  Reports discomfort sitting, no vaginal bleeding Musculo Skeletal  No myalgia, arthralgia, joint swelling or pain  Neurologic  No weakness, numbness, change in gait,  Psychology  No depression.  Patient reports anxiety about the diagnosis Skin Widespread itchy rash (see HPI)  Current Meds:  Outpatient Encounter Prescriptions as of 01/11/2015  Medication Sig  . diphenhydrAMINE (BENADRYL) 25 mg capsule Take 25 mg by mouth every 6 (six) hours as needed for allergies or sleep.   . hydrOXYzine (VISTARIL) 25 MG capsule Take 25-50 mg by mouth at bedtime as needed for itching.  . lidocaine-prilocaine (EMLA) cream Apply to Porta-Cath site 1-2 hours prior to access as directed.  . mupirocin ointment (BACTROBAN) 2 % Apply 1 application topically 3 (three) times daily.  . Nutritional Supplements (CARNATION BREAKFAST ESSENTIALS) PACK Take 0.5 packets by mouth daily with breakfast.  . silver sulfADIAZINE (SILVADENE) 1 % cream Apply 1 application topically daily.  . traMADol (ULTRAM) 50 MG tablet Take by mouth every 8 (eight) hours as needed.  . cyclobenzaprine (FLEXERIL) 10 MG tablet 1/2 to 1 tablet every 8-12 hours as needed for muscle spasm. (Patient not taking: Reported on 12/31/2014)  . FERRETTS 325 (106 FE) MG TABS tablet TAKE 1 TABLET BY MOUTH ON AN EMPTY STOMACH WITH ORANGE JUICE (Patient not taking: Reported on 12/31/2014)  . pantoprazole (PROTONIX) 40 MG tablet Take 1 tablet (40 mg total) by mouth daily. (Patient not taking: Reported on 12/31/2014)  . predniSONE (DELTASONE) 20 MG tablet Take 3 tablets daily as directed with food (Patient not taking: Reported on 12/31/2014)  . triamcinolone cream (KENALOG) 0.1 % Apply 1 application topically 2 (two) times daily.   No  facility-administered encounter medications on file as of 01/11/2015.    Allergy:  Allergies  Allergen Reactions  . Other Rash    Bleach Cleaner  . Penicillins Rash    Social Hx:   Social History   Social History  . Marital Status: Married    Spouse Name: Fraser Din  . Number of Children: 2  . Years of Education: 12   Occupational History  . Not on file.   Social History Main Topics  . Smoking status: Former Smoker -- 1.00 packs/day for 30 years    Types: Cigarettes    Quit date: 07/13/2013  . Smokeless tobacco: Never Used     Comment: current e-cig  . Alcohol Use: No     Comment: rare  . Drug Use: No  . Sexual Activity: No   Other Topics Concern  . Not on file   Social History Narrative   Patient lives at home with Micheline Rough her husband   Patient has a high school education   Patient has 2 children    Patient is right handed    Patient is on leave of absence from [employer]     Past Surgical Hx:  Past Surgical History  Procedure Laterality Date  . Vulva /perineum biopsy  02/05/14  .  Tubal ligation  1980's  . Dilation and curettage of uterus  1980's    Past Medical Hx:  Past Medical History  Diagnosis Date  . Vulvar lesion   . Eczema   . H/O bronchitis   . Headache(784.0)     migraines years ago  . Hx of radiation therapy 03/09/14-04/23/14    vulva/vaginal, pelvic/inguinal nodes 60 Gy 30 fx, vulvar/vaginal nodal boost 6 Gy 3 fx  . History of radiation therapy 10/16, 10/19, 03/04/14     left LL lung tumor/ 54 Gy/3 fx SBRT  . Vulvar cancer 01/2014    future radiation and chemo  . Cancer 01/2014    vulvar    Past Gynecological History:  G2P2 last pap > 9 years ago. No LMP recorded. Patient is postmenopausal.  Family Hx:  Family History  Problem Relation Age of Onset  . Heart disease Mother   . Kidney cancer Father   . Prostate cancer Cousin     Vitals:  Blood pressure 113/70, pulse 103, temperature 98.3 F (36.8 C), temperature source Oral, resp. rate  16, height '5\' 1"'$  (1.549 m), weight 114 lb 1.6 oz (51.755 kg), SpO2 100 %.  Physical Exam: WD in NAD Neck  Supple NROM, without any enlargements.  Lymph Node Survey No cervical or supraclavicular  Adenopathy. No palpable inguinal lymphadenopathy. Skin No rash Cardiovascular  Pulse normal rate, regularity and rhythm. S1 and S2 normal.  Lungs  Clear to auscultation bilaterally, Good air movement.  Skin  Subtle petechiae in groin folds, otherwise no rash  Psychiatry  Alert and oriented appropriate mood affect speech and reasoning. Abdomen  Normoactive bowel sounds, abdomen soft, non-tender. Surgical  sites intact without evidence of hernia.  Back No CVA tenderness Genito Urinary  Vulva/vagina:  There is no gross visible tumor or palpable tumor. There is no longer ulceration in the right labia majora. Radiation changes are visible on the vulvar including tightness of skin, loss of hair, and atrophy. Severely narrowed vagina free of lesions. Agglutination present. Extremities  No bilateral cyanosis, clubbing or edema.  Donaciano Eva, MD 01/11/2015, 12:51 PM

## 2015-01-14 ENCOUNTER — Encounter: Payer: Self-pay | Admitting: Oncology

## 2015-01-14 ENCOUNTER — Ambulatory Visit: Payer: 59

## 2015-01-14 ENCOUNTER — Other Ambulatory Visit (HOSPITAL_BASED_OUTPATIENT_CLINIC_OR_DEPARTMENT_OTHER): Payer: 59

## 2015-01-14 ENCOUNTER — Ambulatory Visit (HOSPITAL_BASED_OUTPATIENT_CLINIC_OR_DEPARTMENT_OTHER): Payer: 59 | Admitting: Oncology

## 2015-01-14 VITALS — BP 110/70 | HR 89 | Temp 98.4°F | Resp 18 | Ht 61.0 in | Wt 115.6 lb

## 2015-01-14 DIAGNOSIS — L309 Dermatitis, unspecified: Secondary | ICD-10-CM

## 2015-01-14 DIAGNOSIS — C541 Malignant neoplasm of endometrium: Secondary | ICD-10-CM

## 2015-01-14 DIAGNOSIS — L270 Generalized skin eruption due to drugs and medicaments taken internally: Secondary | ICD-10-CM

## 2015-01-14 DIAGNOSIS — C519 Malignant neoplasm of vulva, unspecified: Secondary | ICD-10-CM

## 2015-01-14 DIAGNOSIS — Z79899 Other long term (current) drug therapy: Secondary | ICD-10-CM

## 2015-01-14 DIAGNOSIS — C792 Secondary malignant neoplasm of skin: Secondary | ICD-10-CM

## 2015-01-14 DIAGNOSIS — L259 Unspecified contact dermatitis, unspecified cause: Secondary | ICD-10-CM

## 2015-01-14 DIAGNOSIS — C3411 Malignant neoplasm of upper lobe, right bronchus or lung: Secondary | ICD-10-CM

## 2015-01-14 DIAGNOSIS — C7802 Secondary malignant neoplasm of left lung: Principal | ICD-10-CM

## 2015-01-14 DIAGNOSIS — C7801 Secondary malignant neoplasm of right lung: Secondary | ICD-10-CM

## 2015-01-14 DIAGNOSIS — C799 Secondary malignant neoplasm of unspecified site: Secondary | ICD-10-CM

## 2015-01-14 DIAGNOSIS — C3432 Malignant neoplasm of lower lobe, left bronchus or lung: Secondary | ICD-10-CM | POA: Diagnosis not present

## 2015-01-14 DIAGNOSIS — C778 Secondary and unspecified malignant neoplasm of lymph nodes of multiple regions: Secondary | ICD-10-CM

## 2015-01-14 DIAGNOSIS — C3492 Malignant neoplasm of unspecified part of left bronchus or lung: Secondary | ICD-10-CM

## 2015-01-14 DIAGNOSIS — Z95828 Presence of other vascular implants and grafts: Secondary | ICD-10-CM

## 2015-01-14 DIAGNOSIS — IMO0002 Reserved for concepts with insufficient information to code with codable children: Secondary | ICD-10-CM

## 2015-01-14 LAB — CBC WITH DIFFERENTIAL/PLATELET
BASO%: 0.1 % (ref 0.0–2.0)
Basophils Absolute: 0 10*3/uL (ref 0.0–0.1)
EOS ABS: 0.1 10*3/uL (ref 0.0–0.5)
EOS%: 0.7 % (ref 0.0–7.0)
HEMATOCRIT: 35 % (ref 34.8–46.6)
HGB: 11.4 g/dL — ABNORMAL LOW (ref 11.6–15.9)
LYMPH%: 8.9 % — AB (ref 14.0–49.7)
MCH: 29 pg (ref 25.1–34.0)
MCHC: 32.6 g/dL (ref 31.5–36.0)
MCV: 89.1 fL (ref 79.5–101.0)
MONO#: 0.7 10*3/uL (ref 0.1–0.9)
MONO%: 9.7 % (ref 0.0–14.0)
NEUT%: 80.6 % — AB (ref 38.4–76.8)
NEUTROS ABS: 6.2 10*3/uL (ref 1.5–6.5)
PLATELETS: 220 10*3/uL (ref 145–400)
RBC: 3.93 10*6/uL (ref 3.70–5.45)
RDW: 14.8 % — ABNORMAL HIGH (ref 11.2–14.5)
WBC: 7.7 10*3/uL (ref 3.9–10.3)
lymph#: 0.7 10*3/uL — ABNORMAL LOW (ref 0.9–3.3)

## 2015-01-14 LAB — COMPREHENSIVE METABOLIC PANEL (CC13)
ALT: 9 U/L (ref 0–55)
ANION GAP: 10 meq/L (ref 3–11)
AST: 12 U/L (ref 5–34)
Albumin: 3.2 g/dL — ABNORMAL LOW (ref 3.5–5.0)
Alkaline Phosphatase: 121 U/L (ref 40–150)
BILIRUBIN TOTAL: 0.43 mg/dL (ref 0.20–1.20)
BUN: 9 mg/dL (ref 7.0–26.0)
CALCIUM: 10.1 mg/dL (ref 8.4–10.4)
CO2: 25 meq/L (ref 22–29)
Chloride: 100 mEq/L (ref 98–109)
Creatinine: 0.9 mg/dL (ref 0.6–1.1)
EGFR: 69 mL/min/{1.73_m2} — AB (ref >90)
Glucose: 103 mg/dl (ref 70–140)
Potassium: 3.9 mEq/L (ref 3.5–5.1)
Sodium: 135 mEq/L — ABNORMAL LOW (ref 136–145)
TOTAL PROTEIN: 6.8 g/dL (ref 6.4–8.3)

## 2015-01-14 LAB — TSH CHCC: TSH: 5.723 m[IU]/L — AB (ref 0.308–3.960)

## 2015-01-14 NOTE — Progress Notes (Signed)
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  Patient saw Dr Denman George on 01-11-15, local area of the vulvar carcinoma well controlled since RT and chemotherapy thru 07-27-14 ( RT with sensitizing CDDP, then CDDP/taxol).   Patient has had nivolumab x 7 cycles from 5-12 thru 12-31-14, tho doses up until most recent on 8-18 were reduced due to severe chronic dermatitis preceding the nivolumab. Imaging including CT CAP 12-28-14 showed progression in lungs and possible liver compared with March CT. Patient very much wanted to try full dose nivolumab rather than DC this following that imaging, with treatment on 12-31-14 given at 3 mg/kg. She experienced some ~ 2-3 cm blistering lesions on forearms which were not pruritic, took prednisone x 3 days with resolution. She has had new, marked constipation, with no BM x 7 days. She has taken one suppository x1 last week and miralax once daily on 8-30 and 8-31 as only interventions. She is uncomfortable from the constipation, no vomiting, no frank abdominal pain other than same localized intermittent sharp pain at mid left costal margin, is passing some flatus, appetite down with the constipation. Skin otherwise has much thicker texture thruout. Discomfort left lateral chest minimal now.  PAC in  [place]  Patient presented with gradually enlarging mass at right vulva for ~ 8 months, uncomfortable with direct pressure and ulcerated with occasional bleeding. She was seen in ED on 02-02-14 with right inguinal adenopathy and right vulvar masses, by Dr Elonda Husky on  02-03-14, then by Dr Skeet Latch on 02-05-14, with biopsy of both vulvar mass and satellite lesions on inner thigh and mons. Exam found 7 cm right vulvar mass with 4 cm extension to distal vagina, extending anteriorly to urethral meatus and in ischiorectal area, with satellite lesions inner thigh and right mons. Pathology [phone]) showed invasive squamous cell carcinoma arising in background of high grade squamous intraepithelial lesion from vulva biopsy and invasive moderately differentiated squamous cell carcinoma from inner thigh lesion. CXR 02-06-14 showed left lower lobe lung mass 3.6 x 3.6 x 3.6 cm, with no adenopathy and no bony lesions, emphysematous changes. CT chest 02-09-14 showed the lower lobe mass 3.7 x 3.5 x 3.3 cm as well as 3 mm RLL nodule and faint 4 mm LUL nodule, no mediastinal or hilar nodes, no axillary or supraclavicular nodes, and upper abdomen not remarkable. PET 02-18-14 measured LLL lung mass 4.6 x 3.9 cm, hypermetabolic, and noted multiple subcentimeter nodules scattered thruout lungs bilaterally; the right vulvar mass was also hypermetabolic, extending up into right side of lower vagina, extensive R>L and right external iliac adenopathy, no uptake in skeleton. 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 She had 3 stereotactic radiation treatments to the LLL pulmonary mass 54 Gy in 3 fractions 10-16, 10-19 and 03-04-14. She received IMRT to vulvar area from 03-09-14, completed 04-23-14, with weekly CDDP x7 03-09-14 thru 04-20-14. Radiation to vulva, vaginal, and pelvic/inguinal nodes was 60 Gy in 30 fractions to gross disease, 54 Gy in 30 fractions to high risk areas, 50.4Gy in 30 fractions to intermediate risk areas. She had vulvar, vaginal, and nodal boost of  6 Gy in 3 fractions to gross disease. Follow up CT chest 05-04-14 had cavitary, spiculated LLL mass 3.3 x 2.3 cm, having been 4.6 x 3.9 cm prior to IMRT, and no change in  scattered small bilateral pulmonary nodules, however PET 05-27-14 had progression in bilateral pulmonary nodules and partial response in local vulvar involvement. 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 CT CAP 08-10-14 had majority of lung nodules stable with a few slightly increased, 2 mm hepatic lucencies possibly cysts, no adenopathy AP. She had first nivolumab 09-14-14, dose reduced by 50% due to concerns for skin toxicity. Nivolumab dose increased to 2 mg/kg starting cycle 4 on 11-19-14 and 3 mg/kg with treatment 12-31-14.    Review of systems as above, also: No fever. Denies SOB or cough. No bleeding. No HA. Bladder ok. No LE swelling. Some numbness left flank/ left back Remainder of 10 point Review of Systems negative.  Objective:  Vital signs in last 24 hours:  BP 110/70 mmHg  Pulse 89  Temp(Src) 98.4 F (36.9 C) (Oral)  Resp 18  Ht 5' 1"  (1.549 m)  Wt 115 lb 9.6 oz (52.436 kg)  BMI 21.85 kg/m2  SpO2 97% Weight up 1 lb. Respirations not labored. Alert, oriented and appropriate. Ambulatory without difficulty. Looks mildly uncomfortable but NAD No alopecia  HEENT:PERRL, sclerae not icteric. Oral mucosa a little dry without lesions, posterior pharynx clear.  Neck supple. No JVD.  Lymphatics:no cervical,supraclavicular adenopathy Resp: slightly diminished BS but heard thruout,  without wheezes, crackles bilaterally and normal percussion bilaterally Cardio: regular rate and rhythm. No gallop. GI: soft, not specifically tender, slightly distended, no mass or organomegaly. A few bowel sounds. Musculoskeletal/ Extremities: UE/LE without pitting edema, cords, tenderness. Back not tender.  Neuro: no peripheral neuropathy. Otherwise nonfocal Skin Skin texture thickened back, chest, arms without rash, blisters, ecchymosis, petechiae Portacath-without erythema or tenderness  Lab Results:  Results for orders placed or performed in  visit on 01/14/15  CBC with Differential  Result Value Ref Range   WBC 7.7 3.9 - 10.3 10e3/uL   NEUT# 6.2 1.5 - 6.5 10e3/uL   HGB 11.4 (L) 11.6 - 15.9 g/dL   HCT 35.0 34.8 - 46.6 %   Platelets 220 145 - 400 10e3/uL   MCV 89.1 79.5 - 101.0 fL   MCH 29.0 25.1 - 34.0 pg   MCHC 32.6 31.5 - 36.0 g/dL   RBC 3.93 3.70 - 5.45 10e6/uL   RDW 14.8 (H) 11.2 - 14.5 %   lymph# 0.7 (L) 0.9 - 3.3 10e3/uL   MONO# 0.7 0.1 - 0.9 10e3/uL   Eosinophils Absolute 0.1 0.0 - 0.5 10e3/uL   Basophils Absolute 0.0 0.0 - 0.1 10e3/uL   NEUT% 80.6 (H) 38.4 - 76.8 %   LYMPH% 8.9 (L) 14.0 - 49.7 %   MONO% 9.7 0.0 - 14.0 %   EOS% 0.7 0.0 - 7.0 %   BASO% 0.1 0.0 - 2.0 %  Comprehensive metabolic panel (Cmet) - [facility]  Result Value Ref Range   Sodium 135 (L) 136 - 145 mEq/L   Potassium 3.9 3.5 - 5.1 mEq/L   Chloride 100 98 - 109 mEq/L   CO2 25 22 - 29 mEq/L   Glucose 103 70 - 140 mg/dl   BUN 9.0 7.0 - 26.0 mg/dL   Creatinine 0.9 0.6 - 1.1 mg/dL   Total Bilirubin 0.43 0.20 - 1.20 mg/dL   Alkaline Phosphatase 121 40 - 150  U/L   AST 12 5 - 34 U/L   ALT 9 0 - 55 U/L   Total Protein 6.8 6.4 - 8.3 g/dL   Albumin 3.2 (L) 3.5 - 5.0 g/dL   Calcium 10.1 8.4 - 10.4 mg/dL   Anion Gap 10 3 - 11 mEq/L   EGFR 69 (L) >90 ml/min/1.73 m2   TSH available after visit 5.72, up from 2.55 on 8-18, tho note previously briefly to this level then back to normal range.  Studies/Results:  CHEST 2 VIEW  COMPARISON: Chest radiograph November 02, 2014 and chest CT December 28, 2014  FINDINGS: There is widespread parenchymal metastatic disease which has progressed dramatically since June 2016 but appears essentially stable compared to the more recent CT of 2 weeks prior. Focal consolidation in the left lower lobe appears similar to the recent CT and is not progressed. No new opacity seen. Heart size and pulmonary vascularity normal. No apparent adenopathy. Port-A-Cath tip is just proximal to the cavoatrial junction. No pneumothorax.  No bone lesions.  IMPRESSION: Widespread metastatic disease throughout the lung parenchyma bilaterally with multiple nodular opacities, unchanged from 2 weeks prior but dramatically increased from 2 months prior. Stable consolidation left lower lobe. No new opacity. No change in cardiac silhouette. No adenopathy demonstrable.   PACs images reviewed by MD, report discussed with patient and daughter, who declined to see images.   Medications: I have reviewed the patient's current medications. Miralax bid. Hold nivolumab.   DISCUSSION: Constipation with nivolumab in ~ 20%, probably related to increase in dose on 8-18. Instructed to increase miralax to bid now and can use glycerin or dulcolax suppository this pm if no results. Patient to let us know by phone tomorrow if bowels responding. Hold nivolumab now due to bowels, tho I expect we are also reaching maximum benefit from that agent. Patient seems more open to changing regimen, tho we did not discuss in detail today. Next scheduled MD apt is 9-15 (also treatment that day).   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 Improved local disease with RT/ sensitizing CDDP completed 04-23-14, and additional taxol CDDP thru 07-27-14. Pelvic CT does not suggest local progression, Dr Denman George concurred. To see Dr Isidore Moos in Oct and Dr Denman George again in Dec. 2.Squamous cell carcinoma of LLL lung: long past tobacco; imaging and final path suggests primary lung, tho same histology as vulvar primary. Post SBRT to dominant LLL mass. Comparison imaging was done ~ 5 weeks prior to starting nivolumab, which was dose reduced from initiation in May until full dose 12-31-14.  CXR today stable compared with 2 weeks ago, but extensive disease.  3.History of severe dermatitis and eczema since childhood, with severe flares  during initial nivolumab treatment. Dermatology involved.Nivolumab skin symptoms are different than her usual dermatitis and respond well to prednisone 60 mg daily, which she has needed only for short courses recently. She has the prednisone available to start if needed and let this office know if so. 4.constipation x 1 week, likely from nivolumab. Plan as above.  5.PAC in 6.long past tobacco 7.slight increase in TSH today, likely from nivolumab. Will repeat at next visit, no Rx yet. 8.left lateral chest pain much improved, soft tissue thickening on CT likely corresponding and may be in field from RT to LLL mass. With other skin texture changes on nivolumab, possibly this thickening is related to  radiation + nivolumab 9. Advance Directives done. Patient understands that treatment is not expected to be curative.  10.increased GERD controlled with protonix daily  11.anemia related to chemo and pelvic RT: improved, continue oral iron. Thrombocytopenia resolved.   All questions answered. Patient will let us know how she is by phone call to RN on 9-2.-16. Infusion aware of cancelled nivolumab today. Time spent 25 min including >50% counseling and coordination of care.    Carissa Musick P, MD   01/14/2015, 9:24 AM

## 2015-01-15 ENCOUNTER — Telehealth: Payer: Self-pay | Admitting: *Deleted

## 2015-01-15 NOTE — Telephone Encounter (Signed)
Left a message in [name] voice mail stating that Dr. Marko Plume wanted to know if bowels did move as 7 days is a long time for no BM.  She was not going to give her a treatment today if bowels had moved.  Will follow up with her !01-20-15 to see how bowels are doing.

## 2015-01-15 NOTE — Telephone Encounter (Signed)
"   I was there yesterday.  Chemotherapy was not given because I was constipated. I went home and took what Dr. Marko Plume told me.  It did help me.  She told me to call back today.  Return number [phone]."

## 2015-01-18 ENCOUNTER — Other Ambulatory Visit: Payer: Self-pay | Admitting: Oncology

## 2015-01-20 ENCOUNTER — Telehealth: Payer: Self-pay | Admitting: Oncology

## 2015-01-20 ENCOUNTER — Other Ambulatory Visit: Payer: Self-pay | Admitting: Oncology

## 2015-01-20 NOTE — Telephone Encounter (Signed)
MEDICAL ONCOLOGY  Called now, [facility] for patient to return call to RN about rescheduling treatment. Message to desk and triage RNs in this regard  L.Marko Plume, MD

## 2015-01-20 NOTE — Telephone Encounter (Signed)
Spoke with patient and she is aware of her new appointment 9/9

## 2015-01-20 NOTE — Telephone Encounter (Addendum)
Received following staff message from Dr. Marko Plume.    MD LM for patient to call back to RN   If RN speaks with patient:  If bowels are moving well now, abdominal symptoms better with this and able to eat again, and otherwise stable, could reschedule one more nivolumab this week. We will image again after this cycle if so, as may be reaching maximum benefit from this drug.  She will need to continue bowel program daily if repeat nivolumab.   Fine to set up treatment this week if so.  Leave my apt 22-15   Page me if questions   thanks    Jody Beard reports "bowels are moving with Mira lax.  I am forcing myself to eat and trying to drink 64 oz daily but that's a lot to drink.  Pain didn't go away completely, still having stomach ache to left side.  It was a sharp pain that now is an ache.  I use a heating pad.  It's off and on but more noticeable at night.  Would like to receive Nivolumab this week and my schedule is open."  Will notify Dr. Marko Plume.  Urgent P.O.F. Generated.   Instructed to continue Mira lax daily.

## 2015-01-22 ENCOUNTER — Ambulatory Visit (HOSPITAL_BASED_OUTPATIENT_CLINIC_OR_DEPARTMENT_OTHER): Payer: 59

## 2015-01-22 ENCOUNTER — Other Ambulatory Visit (HOSPITAL_BASED_OUTPATIENT_CLINIC_OR_DEPARTMENT_OTHER): Payer: 59

## 2015-01-22 DIAGNOSIS — C792 Secondary malignant neoplasm of skin: Secondary | ICD-10-CM | POA: Diagnosis not present

## 2015-01-22 DIAGNOSIS — C541 Malignant neoplasm of endometrium: Secondary | ICD-10-CM

## 2015-01-22 DIAGNOSIS — Z5112 Encounter for antineoplastic immunotherapy: Secondary | ICD-10-CM | POA: Diagnosis not present

## 2015-01-22 DIAGNOSIS — IMO0002 Reserved for concepts with insufficient information to code with codable children: Secondary | ICD-10-CM

## 2015-01-22 DIAGNOSIS — C778 Secondary and unspecified malignant neoplasm of lymph nodes of multiple regions: Secondary | ICD-10-CM | POA: Diagnosis not present

## 2015-01-22 DIAGNOSIS — C799 Secondary malignant neoplasm of unspecified site: Secondary | ICD-10-CM

## 2015-01-22 DIAGNOSIS — C3492 Malignant neoplasm of unspecified part of left bronchus or lung: Secondary | ICD-10-CM

## 2015-01-22 LAB — COMPREHENSIVE METABOLIC PANEL (CC13)
ANION GAP: 9 meq/L (ref 3–11)
AST: 11 U/L (ref 5–34)
Albumin: 3.1 g/dL — ABNORMAL LOW (ref 3.5–5.0)
Alkaline Phosphatase: 116 U/L (ref 40–150)
BILIRUBIN TOTAL: 0.46 mg/dL (ref 0.20–1.20)
BUN: 14.5 mg/dL (ref 7.0–26.0)
CHLORIDE: 103 meq/L (ref 98–109)
CO2: 26 meq/L (ref 22–29)
CREATININE: 0.9 mg/dL (ref 0.6–1.1)
Calcium: 10 mg/dL (ref 8.4–10.4)
EGFR: 71 mL/min/{1.73_m2} — ABNORMAL LOW (ref >90)
GLUCOSE: 96 mg/dL (ref 70–140)
Potassium: 4.1 mEq/L (ref 3.5–5.1)
SODIUM: 138 meq/L (ref 136–145)
TOTAL PROTEIN: 6.6 g/dL (ref 6.4–8.3)

## 2015-01-22 LAB — CBC WITH DIFFERENTIAL/PLATELET
BASO%: 0.2 % (ref 0.0–2.0)
Basophils Absolute: 0 10*3/uL (ref 0.0–0.1)
EOS%: 1.5 % (ref 0.0–7.0)
Eosinophils Absolute: 0.1 10*3/uL (ref 0.0–0.5)
HCT: 33.3 % — ABNORMAL LOW (ref 34.8–46.6)
HGB: 10.7 g/dL — ABNORMAL LOW (ref 11.6–15.9)
LYMPH%: 10.2 % — AB (ref 14.0–49.7)
MCH: 28.9 pg (ref 25.1–34.0)
MCHC: 32.1 g/dL (ref 31.5–36.0)
MCV: 90 fL (ref 79.5–101.0)
MONO#: 0.5 10*3/uL (ref 0.1–0.9)
MONO%: 8.6 % (ref 0.0–14.0)
NEUT%: 79.5 % — AB (ref 38.4–76.8)
NEUTROS ABS: 4.8 10*3/uL (ref 1.5–6.5)
PLATELETS: 190 10*3/uL (ref 145–400)
RBC: 3.7 10*6/uL (ref 3.70–5.45)
RDW: 15.2 % — ABNORMAL HIGH (ref 11.2–14.5)
WBC: 6.1 10*3/uL (ref 3.9–10.3)
lymph#: 0.6 10*3/uL — ABNORMAL LOW (ref 0.9–3.3)

## 2015-01-22 MED ORDER — HEPARIN SOD (PORK) LOCK FLUSH 100 UNIT/ML IV SOLN
500.0000 [IU] | Freq: Once | INTRAVENOUS | Status: AC | PRN
  Administered 2015-01-22: 500 [IU]
  Filled 2015-01-22: qty 5

## 2015-01-22 MED ORDER — SODIUM CHLORIDE 0.9 % IJ SOLN
10.0000 mL | INTRAMUSCULAR | Status: DC | PRN
  Administered 2015-01-22: 10 mL
  Filled 2015-01-22: qty 10

## 2015-01-22 MED ORDER — SODIUM CHLORIDE 0.9 % IV SOLN
2.8000 mg/kg | Freq: Once | INTRAVENOUS | Status: AC
  Administered 2015-01-22: 160 mg via INTRAVENOUS
  Filled 2015-01-22: qty 16

## 2015-01-22 MED ORDER — SODIUM CHLORIDE 0.9 % IV SOLN
Freq: Once | INTRAVENOUS | Status: AC
  Administered 2015-01-22: 14:00:00 via INTRAVENOUS

## 2015-01-22 NOTE — Patient Instructions (Signed)
[facility] Discharge Instructions for Patients Receiving Chemotherapy  Today you received the following chemotherapy agents Nivolumab  To help prevent nausea and vomiting after your treatment, we encourage you to take your nausea medication     If you develop nausea and vomiting that is not controlled by your nausea medication, call the clinic.   BELOW ARE SYMPTOMS THAT SHOULD BE REPORTED IMMEDIATELY:  *FEVER GREATER THAN 100.5 F  *CHILLS WITH OR WITHOUT FEVER  NAUSEA AND VOMITING THAT IS NOT CONTROLLED WITH YOUR NAUSEA MEDICATION  *UNUSUAL SHORTNESS OF BREATH  *UNUSUAL BRUISING OR BLEEDING  TENDERNESS IN MOUTH AND THROAT WITH OR WITHOUT PRESENCE OF ULCERS  *URINARY PROBLEMS  *BOWEL PROBLEMS  UNUSUAL RASH Items with * indicate a potential emergency and should be followed up as soon as possible.  Feel free to call the clinic you have any questions or concerns. The clinic phone number is [phone].  Please show the CHEMO ALERT CARD at check-in to the Emergency Department and triage nurse.   

## 2015-01-26 ENCOUNTER — Ambulatory Visit (HOSPITAL_COMMUNITY)
Admission: RE | Admit: 2015-01-26 | Discharge: 2015-01-26 | Disposition: A | Discharge status: Home / Self Care | Payer: 59 | Source: Ambulatory Visit | Attending: Oncology | Admitting: Oncology

## 2015-01-26 DIAGNOSIS — C799 Secondary malignant neoplasm of unspecified site: Secondary | ICD-10-CM | POA: Diagnosis not present

## 2015-01-26 DIAGNOSIS — C3432 Malignant neoplasm of lower lobe, left bronchus or lung: Secondary | ICD-10-CM | POA: Insufficient documentation

## 2015-01-26 DIAGNOSIS — IMO0002 Reserved for concepts with insufficient information to code with codable children: Secondary | ICD-10-CM

## 2015-01-27 ENCOUNTER — Other Ambulatory Visit: Payer: Self-pay | Admitting: Oncology

## 2015-01-27 DIAGNOSIS — C799 Secondary malignant neoplasm of unspecified site: Secondary | ICD-10-CM

## 2015-01-27 DIAGNOSIS — C3490 Malignant neoplasm of unspecified part of unspecified bronchus or lung: Secondary | ICD-10-CM

## 2015-01-27 DIAGNOSIS — C519 Malignant neoplasm of vulva, unspecified: Secondary | ICD-10-CM

## 2015-01-27 DIAGNOSIS — IMO0002 Reserved for concepts with insufficient information to code with codable children: Secondary | ICD-10-CM

## 2015-01-28 ENCOUNTER — Ambulatory Visit (HOSPITAL_BASED_OUTPATIENT_CLINIC_OR_DEPARTMENT_OTHER): Payer: 59 | Admitting: Oncology

## 2015-01-28 ENCOUNTER — Telehealth: Payer: Self-pay | Admitting: Oncology

## 2015-01-28 ENCOUNTER — Other Ambulatory Visit (HOSPITAL_BASED_OUTPATIENT_CLINIC_OR_DEPARTMENT_OTHER): Payer: 59

## 2015-01-28 ENCOUNTER — Ambulatory Visit: Payer: 59

## 2015-01-28 ENCOUNTER — Encounter: Payer: Self-pay | Admitting: Oncology

## 2015-01-28 VITALS — BP 100/66 | HR 100 | Temp 98.2°F | Resp 20 | Ht 61.0 in | Wt 116.5 lb

## 2015-01-28 DIAGNOSIS — C3432 Malignant neoplasm of lower lobe, left bronchus or lung: Secondary | ICD-10-CM

## 2015-01-28 DIAGNOSIS — L270 Generalized skin eruption due to drugs and medicaments taken internally: Secondary | ICD-10-CM

## 2015-01-28 DIAGNOSIS — C3490 Malignant neoplasm of unspecified part of unspecified bronchus or lung: Secondary | ICD-10-CM

## 2015-01-28 DIAGNOSIS — C7801 Secondary malignant neoplasm of right lung: Secondary | ICD-10-CM

## 2015-01-28 DIAGNOSIS — Z9889 Other specified postprocedural states: Secondary | ICD-10-CM

## 2015-01-28 DIAGNOSIS — C7802 Secondary malignant neoplasm of left lung: Principal | ICD-10-CM

## 2015-01-28 DIAGNOSIS — C3492 Malignant neoplasm of unspecified part of left bronchus or lung: Secondary | ICD-10-CM | POA: Diagnosis not present

## 2015-01-28 DIAGNOSIS — L259 Unspecified contact dermatitis, unspecified cause: Secondary | ICD-10-CM

## 2015-01-28 DIAGNOSIS — C799 Secondary malignant neoplasm of unspecified site: Secondary | ICD-10-CM

## 2015-01-28 DIAGNOSIS — C801 Malignant (primary) neoplasm, unspecified: Secondary | ICD-10-CM

## 2015-01-28 DIAGNOSIS — IMO0002 Reserved for concepts with insufficient information to code with codable children: Secondary | ICD-10-CM

## 2015-01-28 DIAGNOSIS — C519 Malignant neoplasm of vulva, unspecified: Secondary | ICD-10-CM | POA: Diagnosis not present

## 2015-01-28 DIAGNOSIS — Z95828 Presence of other vascular implants and grafts: Secondary | ICD-10-CM

## 2015-01-28 DIAGNOSIS — G893 Neoplasm related pain (acute) (chronic): Secondary | ICD-10-CM

## 2015-01-28 DIAGNOSIS — Z79899 Other long term (current) drug therapy: Secondary | ICD-10-CM

## 2015-01-28 LAB — CBC WITH DIFFERENTIAL/PLATELET
BASO%: 0.1 % (ref 0.0–2.0)
Basophils Absolute: 0 10*3/uL (ref 0.0–0.1)
EOS%: 2.2 % (ref 0.0–7.0)
Eosinophils Absolute: 0.2 10*3/uL (ref 0.0–0.5)
HEMATOCRIT: 34.5 % — AB (ref 34.8–46.6)
HEMOGLOBIN: 11.1 g/dL — AB (ref 11.6–15.9)
LYMPH#: 0.8 10*3/uL — AB (ref 0.9–3.3)
LYMPH%: 11.7 % — AB (ref 14.0–49.7)
MCH: 28.7 pg (ref 25.1–34.0)
MCHC: 32.2 g/dL (ref 31.5–36.0)
MCV: 89.1 fL (ref 79.5–101.0)
MONO#: 0.7 10*3/uL (ref 0.1–0.9)
MONO%: 10.6 % (ref 0.0–14.0)
NEUT%: 75.4 % (ref 38.4–76.8)
NEUTROS ABS: 5 10*3/uL (ref 1.5–6.5)
PLATELETS: 262 10*3/uL (ref 145–400)
RBC: 3.87 10*6/uL (ref 3.70–5.45)
RDW: 15.1 % — AB (ref 11.2–14.5)
WBC: 6.7 10*3/uL (ref 3.9–10.3)

## 2015-01-28 LAB — COMPREHENSIVE METABOLIC PANEL (CC13)
ALBUMIN: 3.2 g/dL — AB (ref 3.5–5.0)
ANION GAP: 10 meq/L (ref 3–11)
AST: 11 U/L (ref 5–34)
Alkaline Phosphatase: 126 U/L (ref 40–150)
BILIRUBIN TOTAL: 0.34 mg/dL (ref 0.20–1.20)
BUN: 14 mg/dL (ref 7.0–26.0)
CO2: 27 meq/L (ref 22–29)
CREATININE: 1.1 mg/dL (ref 0.6–1.1)
Calcium: 11.2 mg/dL — ABNORMAL HIGH (ref 8.4–10.4)
Chloride: 101 mEq/L (ref 98–109)
EGFR: 57 mL/min/{1.73_m2} — ABNORMAL LOW (ref >90)
Glucose: 106 mg/dl (ref 70–140)
Potassium: 3.9 mEq/L (ref 3.5–5.1)
Sodium: 139 mEq/L (ref 136–145)
TOTAL PROTEIN: 7.1 g/dL (ref 6.4–8.3)

## 2015-01-28 LAB — UA PROTEIN, DIPSTICK - CHCC: Protein, ur: NEGATIVE mg/dL

## 2015-01-28 LAB — TSH CHCC: TSH: 2.689 m[IU]/L (ref 0.308–3.960)

## 2015-01-28 MED ORDER — TRAMADOL HCL 50 MG PO TABS: 50.0000 mg | ORAL_TABLET | Freq: Three times a day (TID) | ORAL | Status: DC | PRN

## 2015-01-28 MED ORDER — ALBUTEROL SULFATE HFA 108 (90 BASE) MCG/ACT IN AERS: 1.0000 | INHALATION_SPRAY | Freq: Four times a day (QID) | RESPIRATORY_TRACT | Status: AC | PRN

## 2015-01-28 NOTE — Progress Notes (Signed)
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  She has radiographic evidence of progression now on full dose nivolumab, last administered on 01-22-15 at 3 mg/kg.   Patient tolerated most recent nivolumab without acute problems, including no significant rash or severe pruritis and did not need additional pulse steroids, but overall is not feeling as well as she had been. She has been less active, tho she denies specific SOB, spends most of time on couch. She sleeps poorly at night, maximum of 3 hours early in night then generally awake, combination of generalized itching "skin is so tight" and some discomfort left lateral back and right groin. Area at right groin, where she had most skin reaction after radiation, is progressively more indurated and is sore when she walks, tho not uncomfortable when seated. Bowels finally moved well after ~ 2 days of laxatives following last visit and have continued to move well daily using miralax bid. She stopped tramadol and hs hydroxyzine when constipated, but fine to resume these now.    PAC in Needs flu vaccine this fall, but will wait to coordinate out from treatments  Daughter very supportive  ONCOLOGIC HISTORY Patient presented with gradually enlarging mass at right vulva for ~ 8 months, uncomfortable with direct pressure and ulcerated with occasional bleeding. She was seen in ED on 02-02-14 with right inguinal adenopathy and right vulvar masses, by Dr Elonda Husky on 02-03-14, then by Dr Skeet Latch on 02-05-14, with biopsy of both vulvar mass and satellite lesions on inner thigh and mons.  Exam found 7 cm right vulvar mass with 4 cm extension to distal vagina, extending anteriorly to urethral meatus and in ischiorectal area, with satellite lesions inner thigh and right mons. Pathology [phone]) showed invasive squamous cell carcinoma arising in background of high grade squamous intraepithelial lesion from vulva biopsy and invasive moderately differentiated squamous cell carcinoma from inner thigh lesion. CXR 02-06-14 showed left lower lobe lung mass 3.6 x 3.6 x 3.6 cm, with no adenopathy and no bony lesions, emphysematous changes. CT chest 02-09-14 showed the lower lobe mass 3.7 x 3.5 x 3.3 cm as well as 3 mm RLL nodule and faint 4 mm LUL nodule, no mediastinal or hilar nodes, no axillary or supraclavicular nodes, and upper abdomen not remarkable. PET 02-18-14 measured LLL lung mass 4.6 x 3.9 cm, hypermetabolic, and noted multiple subcentimeter nodules scattered thruout lungs bilaterally; the right vulvar mass was also hypermetabolic, extending up into right side of lower vagina, extensive R>L and right external iliac adenopathy, no uptake in skeleton. 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 She had 3 stereotactic radiation treatments to the LLL pulmonary mass 54 Gy in 3 fractions 10-16, 10-19 and 03-04-14. She received IMRT to vulvar area from 03-09-14, completed 04-23-14, with weekly CDDP x7 03-09-14 thru 04-20-14. Radiation to vulva, vaginal, and pelvic/inguinal nodes was 60 Gy in 30 fractions to gross disease, 54 Gy in 30 fractions to high risk areas, 50.4Gy in 30 fractions to intermediate risk areas. She had vulvar, vaginal, and nodal boost of 6 Gy in 3 fractions to gross disease. Follow up CT chest 05-04-14 had  cavitary, spiculated LLL mass 3.3 x 2.3 cm, having been 4.6 x 3.9 cm prior to IMRT, and no change in scattered small bilateral pulmonary nodules, however PET 05-27-14 had progression in bilateral pulmonary nodules and  partial response in local vulvar involvement. 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 CT CAP 08-10-14 had majority of lung nodules stable with a few slightly increased, 2 mm hepatic lucencies possibly cysts, no adenopathy AP. She had first nivolumab 09-14-14, dose reduced by 50% due to concerns for skin toxicity. Nivolumab dose increased to 2 mg/kg starting cycle 4 on 11-19-14 and 3 mg/kg with treatment 12-31-14.    Review of systems as above, also: No fever. No HA or other neurologic symptoms. No nausea or vomiting. Slight wheezing seems positional, clears with cough. No swelling LE. No problems with PAC. Voiding ok. No bleeding Remainder of 10 point Review of Systems negative.  Objective:  Vital signs in last 24 hours:  BP 100/66 mmHg  Pulse 101  Temp(Src) 98.2 F (36.8 C) (Oral)  Resp 18  Ht _0  (1.549 m)  Wt 116 lb 8 oz (52.844 kg)  BMI 22.02 kg/m2  O2 saturation walking in hall   100% Weight up 1 lb Alert, oriented and appropriate. Ambulatory without assistance.  Alopecia  HEENT:PERRL, sclerae not icteric. Oral mucosa a little dry without lesions, posterior pharynx clear.  Neck supple. No JVD.  Lymphatics:no cervical,supraclavicular, axillary or inguinal adenopathy palpable Resp: diminished BS thruout as baseline, no wheezing or crackles and no dullness to percussion bilaterally Cardio: regular rate and rhythm. No gallop. GI: soft, nontender, not distended, no mass or organomegaly. A fe bowel sounds.  Musculoskeletal/ Extremities: without pitting edema, cords, tenderness Neuro: no change peripheral neuropathy. Otherwise nonfocal CN, motor, cerebellar. PSYCH appropriate mood and affect tho less talkative than usual Skin thickened texture thruout but without rash, ecchymosis, petechiae Radiation changes at vulva. Induration thru right inguinal area without palpable adenopathy, discomfort localized to area of induration, no  evidence of cellulitis.  Portacath-without erythema or tenderness  Lab Results:  Results for orders placed or performed in visit on 01/28/15  CBC with Differential  Result Value Ref Range   WBC 6.7 3.9 - 10.3 10e3/uL   NEUT# 5.0 1.5 - 6.5 10e3/uL   HGB 11.1 (L) 11.6 - 15.9 g/dL   HCT 34.5 (L) 34.8 - 46.6 %   Platelets 262 145 - 400 10e3/uL   MCV 89.1 79.5 - 101.0 fL   MCH 28.7 25.1 - 34.0 pg   MCHC 32.2 31.5 - 36.0 g/dL   RBC 3.87 3.70 - 5.45 10e6/uL   RDW 15.1 (H) 11.2 - 14.5 %   lymph# 0.8 (L) 0.9 - 3.3 10e3/uL   MONO# 0.7 0.1 - 0.9 10e3/uL   Eosinophils Absolute 0.2 0.0 - 0.5 10e3/uL   Basophils Absolute 0.0 0.0 - 0.1 10e3/uL   NEUT% 75.4 38.4 - 76.8 %   LYMPH% 11.7 (L) 14.0 - 49.7 %   MONO% 10.6 0.0 - 14.0 %   EOS% 2.2 0.0 - 7.0 %   BASO% 0.1 0.0 - 2.0 %  Urine protein by dipstick - [facility]  Result Value Ref Range   Protein, ur Negative Negative- <30 mg/dL    CMET available after visit normal with exception of albumin 3.2 and calcium 11.2, EGFR 57 with creatinine 1.1 and BUN 14  TSH available after visit WNL 2.689  Urine protein negative, this done for Ramucirumab  Studies/Results:  Dg Chest 2 View  01/26/2015   CLINICAL DATA:  Metastatic squamous cell lung carcinoma  EXAM: CHEST  2 VIEW  COMPARISON:  January 11, 2015  FINDINGS: Widespread pulmonary metastatic disease is again noted with innumerable pulmonary nodular lesions. The larger nodular lesions appear stable. There are a few new subcentimeter metastatic lesions in the lungs compared to recent prior study. The consolidation in the left lower lobe is stable.  The heart size and pulmonary vascularity are normal. No adenopathy appreciable. Port-A-Cath tip is in the superior vena cava. No pneumothorax. No blastic or lytic bone lesions are identified.  IMPRESSION: Overall slight progression of pulmonary metastatic disease compared to 2 weeks prior. The larger nodular lesions remain stable compared to 2 weeks prior. Stable  consolidation left lower lobe posteriorly. No adenopathy demonstrable. No change in Port-A-Cath position.   Electronically Signed   By: Lowella Grip III M.D.   On: 01/26/2015 13:20   PACs images reviewed with patient and daughter at time of visit  Medications: I have reviewed the patient's current medications. Continue miralax bid or as needed to keep bowels moving daily. Resume prn tramadol and vistaril at hs, can repeat vistaril at 4-6 hrs if needed. Albuterol inhaler prn. Decadron 8 mg bid x 3 days around taxotere. Neulasta to be administered at [facility] due to skin concerns.  DISCUSSION: progression of metastatic squamous cell carcinoma extensively involving lungs, likely primary LLL lung (vs metastatic vulvar). I have discussed prior to visit with my partner in pulmonary oncology: with progression in lungs on initial platin agents and taxol and further progression on nivolumab, recommendation is docetaxel + Cyramza (ramucirumab) every 3 weeks, with steroids x 3 days around docetaxed and neulasta support. NOTE with skin problems, likely best not to use On Pro now. Other options which would be considerations for vulvar squamous cell include Alimta or navelbine. Patient and daughter are in agreement with changing therapy, which is in attempt to control this incurable malignancy. Teaching for docetaxel and ramucirumab done by RN now, reviewed by MD. We will begin in ~ 2 weeks to allow recovery from most recent nivolumab.  Medications as above. 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 Improved local disease with RT/ sensitizing CDDP completed 04-23-14, and additional taxol CDDP thru 07-27-14. Pelvic CT 12-28-14 does not suggest local progression. Severe local pain resolved with that treatment. Increased induration  right groin, where most symptomatic skin reaction after radiation, may be combination of radiation effects + nivolumab. She has follow up with Dr Isidore Moos on 10-14 and Dr Denman George in Dec  2.Squamous cell carcinoma of LLL lung: long tobacco DCd 2015; imaging and final path suggest primary lung, tho same histology as vulvar primary. Post SBRT to dominant LLL mass. Progressive, extensive pulmonary mets including last 2 doses nivolumab. Will try docetaxel + ramucirumab in attempt to improve/ control disease. Patient and daughter understand that this is not situation of possible cure. Begin ~ 9-29, neulasta at office, MD to see ~ a week after. O2 sat good with exertion today. Prn albuterol inhaler 3.History of severe dermatitis and eczema since childhood, with severe flares during initial nivolumab treatment. Dermatology involved. 4.constipation likely from nivolumab, improved with increased laxatives,, continuing 5.PAC in 6.long past tobaccoDCd 07-2013 7.TSH has been variable on nivolumab, back in normal range today, not on medication  8.left lateral chest pain much improved, soft tissue thickening on CT likely corresponding and may be in field from RT to LLL mass. With other skin texture changes on nivolumab, possibly this thickening is related to radiation + nivolumab. Similar situation right groin, where discomfort seems local as opposed to referred from hip 9. Advance Directives done. Patient understands that treatment is not expected to be curative.  10.increased GERD controlled with protonix daily  11.anemia related to chemo and pelvic RT: improved, continue oral iron. Thrombocytopenia resolved. 12.needs flu vaccine: will coordinate with treatments at this office.   All questions answered and patient gives verbal consent for treatment. Chemo, neulasta and VEGF inhibitor orders placed, message to managed care for preauth. Time spent 40 min including >50% counseling and coordination of care. Cc Drs  Rica Records, MD   01/28/2015, 9:46 AM

## 2015-01-28 NOTE — Telephone Encounter (Signed)
Gave avs & calendar for September/October. °

## 2015-01-29 ENCOUNTER — Other Ambulatory Visit: Payer: Self-pay | Admitting: Oncology

## 2015-02-01 ENCOUNTER — Telehealth: Payer: Self-pay | Admitting: Neurology

## 2015-02-01 ENCOUNTER — Telehealth: Payer: Self-pay | Admitting: Oncology

## 2015-02-01 NOTE — Telephone Encounter (Signed)
Returned patients call as she would like her injection to be later on 9/30

## 2015-02-04 NOTE — Progress Notes (Addendum)
Thoracic Location of Tumor / Histology:Pulmonary mets of left lower lung squamous cell carcinoma from vulvar  Patient presented rash and hard knots right groin  Biopsies of   Tobacco/Marijuana/Snuff/ETOH use: no    Past/Anticipated interventions by medical oncology, if any: Dr. Marko Plume   Signs/Symptoms  Weight changes, if any:10 lbs weight loss pass 6 months  Respiratory complaints, if any:is, if any:No    Pain issues, if any: sitting not bad when standing back of  right leg groin area now   SAFETY ISSUES: no  Prior radiation? 03/09/14-04/23/14  Pacemaker/ICD? NO    Possible current pregnancy? NO  Is the patient on methotrexate? NO  Current Complaints / other details: rash in vaginal area  Smaller than last time stated BP 76/60 mmHg  Pulse 126  Temp(Src) 97.8 F (36.6 C)  Ht '5\' 1"'$  (1.549 m)  Wt 114 lb (51.71 kg)  BMI 21.55 kg/m2  SpO2 96%  Wt Readings from Last 3 Encounters:  02/05/15 114 lb (51.71 kg)  01/28/15 116 lb 8 oz (52.844 kg)  01/14/15 115 lb 9.6 oz (52.436 kg)

## 2015-02-05 ENCOUNTER — Encounter: Payer: Self-pay | Admitting: *Deleted

## 2015-02-05 ENCOUNTER — Ambulatory Visit (HOSPITAL_COMMUNITY)
Admission: RE | Admit: 2015-02-05 | Discharge: 2015-02-05 | Disposition: A | Discharge status: Home / Self Care | Payer: 59 | Source: Ambulatory Visit | Attending: Radiation Oncology | Admitting: Radiation Oncology

## 2015-02-05 ENCOUNTER — Telehealth: Payer: Self-pay | Admitting: *Deleted

## 2015-02-05 ENCOUNTER — Other Ambulatory Visit: Payer: Self-pay | Admitting: *Deleted

## 2015-02-05 ENCOUNTER — Telehealth: Payer: Self-pay

## 2015-02-05 ENCOUNTER — Ambulatory Visit
Admission: RE | Admit: 2015-02-05 | Discharge: 2015-02-05 | Disposition: A | Discharge status: Home / Self Care | Payer: 59 | Source: Ambulatory Visit | Attending: Radiation Oncology | Admitting: Radiation Oncology

## 2015-02-05 ENCOUNTER — Ambulatory Visit (HOSPITAL_BASED_OUTPATIENT_CLINIC_OR_DEPARTMENT_OTHER): Payer: 59 | Admitting: Nurse Practitioner

## 2015-02-05 ENCOUNTER — Encounter: Payer: Self-pay | Admitting: Radiation Oncology

## 2015-02-05 VITALS — BP 76/60 | HR 126 | Temp 97.8°F | Ht 61.0 in | Wt 114.0 lb

## 2015-02-05 VITALS — BP 90/55 | HR 78 | Resp 18

## 2015-02-05 DIAGNOSIS — C7802 Secondary malignant neoplasm of left lung: Secondary | ICD-10-CM | POA: Diagnosis not present

## 2015-02-05 DIAGNOSIS — M79604 Pain in right leg: Secondary | ICD-10-CM | POA: Diagnosis present

## 2015-02-05 DIAGNOSIS — Z86718 Personal history of other venous thrombosis and embolism: Secondary | ICD-10-CM | POA: Insufficient documentation

## 2015-02-05 DIAGNOSIS — C519 Malignant neoplasm of vulva, unspecified: Secondary | ICD-10-CM | POA: Insufficient documentation

## 2015-02-05 DIAGNOSIS — I82401 Acute embolism and thrombosis of unspecified deep veins of right lower extremity: Secondary | ICD-10-CM | POA: Diagnosis not present

## 2015-02-05 DIAGNOSIS — Z923 Personal history of irradiation: Secondary | ICD-10-CM | POA: Diagnosis not present

## 2015-02-05 DIAGNOSIS — Z510 Encounter for antineoplastic radiation therapy: Secondary | ICD-10-CM | POA: Insufficient documentation

## 2015-02-05 MED ORDER — SILVER SULFADIAZINE 1 % EX CREA
TOPICAL_CREAM | Freq: Every day | CUTANEOUS | Status: DC
  Administered 2015-02-05: 09:00:00 via TOPICAL

## 2015-02-05 MED ORDER — HEPARIN SOD (PORK) LOCK FLUSH 100 UNIT/ML IV SOLN
500.0000 [IU] | INTRAVENOUS | Status: AC | PRN
  Administered 2015-02-05: 500 [IU]
  Filled 2015-02-05: qty 5

## 2015-02-05 MED ORDER — ENOXAPARIN SODIUM 80 MG/0.8ML ~~LOC~~ SOLN: 80.0000 mg | SUBCUTANEOUS | Status: DC

## 2015-02-05 MED ORDER — SODIUM CHLORIDE 0.9 % IV SOLN
INTRAVENOUS | Status: DC
  Administered 2015-02-05: 09:00:00 via INTRAVENOUS

## 2015-02-05 MED ORDER — ENOXAPARIN SODIUM 80 MG/0.8ML ~~LOC~~ SOLN
80.0000 mg | Freq: Once | SUBCUTANEOUS | Status: AC
  Administered 2015-02-05: 80 mg via SUBCUTANEOUS
  Filled 2015-02-05: qty 0.8

## 2015-02-05 MED ORDER — SODIUM CHLORIDE 0.9 % IJ SOLN
10.0000 mL | INTRAMUSCULAR | Status: AC | PRN
  Administered 2015-02-05: 10 mL

## 2015-02-05 NOTE — Progress Notes (Signed)
Gave silvadene to patient to apply to affected skin area bid, must wash off before re-applying silvadene to skin, patient stated yes, she has used this before, called Infusion, spoke with Sharyn Lull, no room for IVF'S, thanked her and called sickle cell, they will give her the IVF"S nS 1 liter, patient does have a cell phone, shirley halsey to call later for appt with an U/S for patient, patint gave verbal understanding, Scott transported patient via w/c to the Sickle Cell dept 8:51 AM

## 2015-02-05 NOTE — Progress Notes (Signed)
Diagnosis Association: Primary vulvar cancer (184.4)  MD: S. Squire  Procedure: Infused 1 liter of Norma Saline via porta cath  Condition during procedure: Pt tolerated well  Condition after procedure: Pt alert, oriented and ambulatory to wheelchair. Porta cath flushed and de accesed per protocol

## 2015-02-05 NOTE — Telephone Encounter (Signed)
CALLED PATIENT TO INFORM OF PT APPT. AND NUTRITION APPT., SPOKE WITH PATIENT AND SHE IS AWARE OF THESE APPTS.

## 2015-02-05 NOTE — Telephone Encounter (Signed)
Called patient to inform of ultrasound for 02-05-15 @ 2 pm @ [facility], spoke with patient and she is aware of this test

## 2015-02-05 NOTE — Progress Notes (Signed)
Pt instructed on lovenox injections. Pt was successful with giving self injection. Provided educational materials on lovenox administration. Reviewed with patient.

## 2015-02-05 NOTE — Telephone Encounter (Signed)
Told Ms. Barrette that she should not go to physical therapy as scheduled for Monday 02-08-15 with newly diagnosed DVT in RLE. Per Selena Lesser NP. Swelling and pain may resolve with decreased size of clot.  Can re-evaluate the need for PT at next visit with Dr. Marko Plume on 02-18-15. Left message cancelling PT evaluation on [facility] . Phone: [phone].  Ms. Poland verbalized understanding.

## 2015-02-05 NOTE — Addendum Note (Signed)
Encounter addended by: [name], MD on: 02/05/2015 12:58 PM<BR>     Documentation filed: Notes Section

## 2015-02-05 NOTE — Progress Notes (Signed)
*  PRELIMINARY RESULTS* Vascular Ultrasound Lower extremity venous duplex has been completed.  Preliminary findings: Occlusive Acute DVT noted in the right common femoral, femoral, popliteal, gastroc, posterior tibial, and peroneal veins. No DVT LLE.   Called results to Dr. Isidore Moos. Patient instructed to check in at [facility] to be seen about DVT treatment.   Landry Mellow, RDMS, RVT  02/05/2015, 12:52 PM

## 2015-02-05 NOTE — Progress Notes (Addendum)
Radiation Oncology         [phone] ________________________________  Name: Jody Beard MRN: [id number]  Date: 02/05/2015  DOB: 1951/03/23  Follow-Up Visit Note  outpatient  CC: Jody Hillier, MD  Jody Morning, MD   Diagnosis and Prior Radiotherapy:    ICD-9-CM ICD-10-CM   1. 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 Indication for treatment:  Curative, with concurrent  cisplatin chemotherapy    Radiation treatment dates:  03/09/2014-04/23/2014 Site/dose:   1) vulva, vaginal, and pelvic/inguinal nodes / 60 Gy in 30 fractions to gross disease, 54 Gy in 30 fractions to high risk areas, 50.4Gy in 30 fractions to intermediate risk areas 2) Vulvar, vaginal, and nodal boost / 6 Gy in 3 fractions to gross disease  DIAGNOSIS: 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 Presents for follow-up early due to progressive soreness and pain in the right groin, especially with walking. The pain feels more muscular than skin based. The pain is not transmitted down her right leg with walking. She does not report any significant vulvar pain. She is  applying vegetable oil to her skin. Her nivolumab is being switched to new systemic agents due to progressive pulmonary disease. Today she notes some swelling in her right lower leg and is therefore elevating it. Patient c/o rash and hard knots right groin. The patient projected a health mental status and was not accompanied by family for today's radiation oncology appointment.    ALLERGIES:  is allergic to other and penicillins.  Meds: Current Outpatient Prescriptions  Medication Sig Dispense Refill  . Nutritional Supplements (CARNATION BREAKFAST ESSENTIALS) PACK Take 0.5 packets by mouth daily with breakfast.    . polyethylene glycol (MIRALAX / GLYCOLAX) packet Take 17 g by mouth 2 (two) times daily.    . traMADol (ULTRAM) 50 MG tablet Take 1-2 tablets (50-100 mg total) by mouth every 8 (eight) hours as needed. 90 tablet 0  . albuterol (PROVENTIL HFA;VENTOLIN HFA) 108 (90 BASE) MCG/ACT inhaler Inhale 1-2 puffs into the lungs every 6 (six) hours as needed for wheezing or shortness of breath. (Patient not taking: Reported on 02/05/2015) 1 Inhaler 2  . cyclobenzaprine (FLEXERIL) 10 MG tablet 1/2 to 1 tablet every 8-12 hours as needed for muscle spasm. (Patient not taking: Reported on 12/31/2014) 10 tablet 0  . diphenhydrAMINE (BENADRYL) 25 mg capsule Take 25 mg  by mouth every 6 (six) hours as needed for allergies or sleep.     Marland Kitchen FERRETTS 325 (106 FE) MG TABS tablet TAKE 1 TABLET BY MOUTH ON AN EMPTY STOMACH WITH ORANGE JUICE (Patient not taking: Reported on 01/28/2015) 30 tablet 5  . hydrOXYzine (VISTARIL) 25 MG capsule Take 25-50 mg by mouth at bedtime as needed for itching.    . lidocaine-prilocaine (EMLA) cream Apply to Porta-Cath site 1-2 hours prior to access as directed. (Patient not taking: Reported on 01/28/2015) 30 g 1  . Melatonin 10 MG TBDP Take 10 mg by mouth at bedtime.    . mupirocin ointment (BACTROBAN) 2 % Apply 1 application topically 3 (three) times daily.  3  . pantoprazole  (PROTONIX) 40 MG tablet Take 1 tablet (40 mg total) by mouth daily. (Patient not taking: Reported on 12/31/2014) 30 tablet 5  . silver sulfADIAZINE (SILVADENE) 1 % cream Apply 1 application topically daily. (Patient not taking: Reported on 02/05/2015) 50 g 0  . triamcinolone cream (KENALOG) 0.1 % Apply 1 application topically 2 (two) times daily.  3   Current Facility-Administered Medications  Medication Dose Route Frequency Jody Beard Last Rate Last Dose  . silver sulfADIAZINE (SILVADENE) 1 % cream   Topical Daily Jody Gibson, MD        Physical Findings: The patient is in no acute distress. Patient is alert and oriented. There is no significant changes to the status of the paients overall health to be noted at this time.  height is '5\' 1"'$  (1.549 m) and weight is 114 lb (51.71 kg). Her temperature is 97.8 F (36.6 C). Her blood pressure is 76/60 and her pulse is 126. Her oxygen saturation is 96%.    She is thin, appears uncomfortable. Erythematous dry rash throughout her legs bilaterally. No lower extremity edema. There is moist desquamation throughout the labia. There is a 1.5cm subcutaneous nodule over the right pubis. 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 No obvious palpable lymphadenopathy in the pelvic region.   Lab Findings: Lab Results  Component Value Date   WBC 6.7 01/28/2015   HGB 11.1* 01/28/2015   HCT 34.5* 01/28/2015   MCV 89.1 01/28/2015   PLT 262 01/28/2015    Radiographic Findings: Dg Chest 2 View  01/26/2015   CLINICAL DATA:  Metastatic squamous cell lung carcinoma  EXAM: CHEST  2 VIEW  COMPARISON:  January 11, 2015  FINDINGS: Widespread pulmonary metastatic disease is again noted with innumerable pulmonary nodular lesions. The larger nodular lesions appear stable. There are a few new subcentimeter metastatic lesions in the lungs compared to recent prior study. The consolidation in the left lower lobe is stable.  The heart size and  pulmonary vascularity are normal. No adenopathy appreciable. Port-A-Cath tip is in the superior vena cava. No pneumothorax. No blastic or lytic bone lesions are identified.  IMPRESSION: Overall slight progression of pulmonary metastatic disease compared to 2 weeks prior. The larger nodular lesions remain stable compared to 2 weeks prior. Stable consolidation left lower lobe posteriorly. No adenopathy demonstrable. No change in Port-A-Cath position.   Electronically Signed   By: Lowella Grip III M.D.   On: 01/26/2015 13:20   Dg Chest 2 View  01/11/2015   CLINICAL DATA:  Metastatic squamous cell lung carcinoma  EXAM: CHEST  2 VIEW  COMPARISON:  Chest radiograph November 02, 2014 and chest CT December 28, 2014  FINDINGS: There is widespread parenchymal metastatic disease which has progressed dramatically since June  2016 but appears essentially stable compared to the more recent CT of 2 weeks prior. Focal consolidation in the left lower lobe appears similar to the recent CT and is not progressed. No new opacity seen. Heart size and pulmonary vascularity normal. No apparent adenopathy. Port-A-Cath tip is just proximal to the cavoatrial junction. No pneumothorax. No bone lesions.  IMPRESSION: Widespread metastatic disease throughout the lung parenchyma bilaterally with multiple nodular opacities, unchanged from 2 weeks prior but dramatically increased from 2 months prior. Stable consolidation left lower lobe. No new opacity. No change in cardiac silhouette. No adenopathy demonstrable.   Electronically Signed   By: Lowella Grip III M.D.   On: 01/11/2015 13:41    Impression/Plan:  Proceed with systemic therapy as directed by med/onc in future (lung nodules). The patient is advised to continue GYN and dermatology follow-up appointments as needed. The patient is advised to follow-up with radiation oncology as scheduled. She understands the benefits, purpose, and appropriate use of the provided Silvadene. All  vocalized questions and concerns have been addressed. If the patient develops any further questions or concerns in regards to her treatment and recovery, she has been encouraged to contact Dr. Isidore Moos, MD.  (1) She has moist desquamation in the vulvar region/labia and nodule over the pubis, which may or may not be malignant. This nodule is not bothering her nor is the moist desquamation. However, to try and heal the moist desquamation she has been provided with more Silvadene to use. I do not recommend any interventions for the nodule at this time. (2) She has pain in her groin and has not been walking much as this exacerbates the pain and transmits pain to her leg. This could be secondary to radiation fibrosis. I think she could derive palliative benefit from a physical therapy evaluation and treatment. I will make that referral.  (3) For the pain in her leg and  behind her right knee and subjective swelling in her right lower leg, I will order a ultrasound today to rule out DVT.  (4) She reports very poor PO intake because she feels full and has poor taste. Orthostatic vitals noted. Refer to nutrition and order IV fluids today.  The patient is aware of her follow-up appointment to take place as scheduled in one months time for re-evaluation.   This document serves as a record of services personally performed by Jody Gibson, MD. It was created on her behalf by Lenn Cal, a trained medical scribe. The creation of this record is based on the scribe's personal observations and the Latayna Ritchie's statements to them. This document has been checked and approved by the attending Carrie Usery.  ADDENDUM Received phone report that she has a DVT throughout most of her RLE.  Nicholos Johns, RN is calling up to [facility], RN to see who can  visit with pt in med/onc to start anticoagulation.  Pt is reporting back to the cancer center lobby. _____________________________________   Jody Gibson, MD

## 2015-02-07 ENCOUNTER — Encounter: Payer: Self-pay | Admitting: Nurse Practitioner

## 2015-02-07 ENCOUNTER — Other Ambulatory Visit: Payer: Self-pay | Admitting: Oncology

## 2015-02-07 DIAGNOSIS — I82409 Acute embolism and thrombosis of unspecified deep veins of unspecified lower extremity: Secondary | ICD-10-CM | POA: Insufficient documentation

## 2015-02-07 DIAGNOSIS — I82401 Acute embolism and thrombosis of unspecified deep veins of right lower extremity: Secondary | ICD-10-CM

## 2015-02-07 NOTE — Progress Notes (Signed)
SYMPTOM MANAGEMENT CLINIC   HPI: Jody Beard 64 y.o. female diagnosed with both lung cancer and vulvar cancer.  Most recently-patient received nivolumab immunotherapy.  The plan is for the patient to initiate Taxotere/Cyramza chemotherapy next week.  Patient reports right leg edema and discomfort for the past several days.  She denies any chest pain, chest pressure, shortness breath, or pain with inspiration.  On exam.-Patient's right leg is significantly edematous; but no obvious tenderness with palpation.  All pulses are palpable extremities are warm.  Doppler ultrasound obtained today revealed occlusive acute DVT in the right common femoral, femoral, popliteal, gastroc, posterior tibial, and peroneal veins.  No DVT noted to the left lower extremity.  After reviewing all findings with Dr. Inda Castle was made to initiate Lovenox therapy for treatment of DVT.  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  Carefully reviewed all Lovenox education with the patient; and the patient gave herself the first Lovenox injection.  Patient will be prescribed Lovenox 1.5 mg/kg on a daily basis; which equals 80 mg injection once per day.   HPI  ROS  Past Medical History  Diagnosis Date  . Vulvar lesion   . Eczema   . H/O bronchitis   . Headache(784.0)     migraines years ago  . Hx of radiation therapy 03/09/14-04/23/14    vulva/vaginal, pelvic/inguinal nodes 60 Gy 30 fx, vulvar/vaginal nodal boost 6 Gy 3 fx  . History of radiation therapy 10/16, 10/19, 03/04/14     left LL lung tumor/ 54 Gy/3 fx SBRT  . Vulvar cancer 01/2014    future radiation and chemo  . Cancer 01/2014    vulvar    Past Surgical History  Procedure Laterality Date  . Vulva /perineum biopsy  02/05/14  . Tubal ligation  1980's  . 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    is allergic to other and penicillins.    Medication List       This list is accurate as of: 02/05/15 11:59 PM.  Always use your most recent med list.               albuterol 108 (90 BASE) MCG/ACT inhaler  Commonly known as:  PROVENTIL HFA;VENTOLIN HFA  Inhale 1-2 puffs into the lungs every 6 (six) hours as needed for wheezing or shortness of breath.     CARNATION BREAKFAST ESSENTIALS Pack  Take 0.5 packets by mouth daily with breakfast.     cyclobenzaprine 10 MG tablet  Commonly known as:  FLEXERIL  1/2 to 1 tablet every 8-12 hours as needed for muscle spasm.     diphenhydrAMINE 25 mg capsule  Commonly known as:  BENADRYL  Take 25 mg by mouth every 6 (six) hours as needed for allergies or sleep.     enoxaparin 80 MG/0.8ML injection  Commonly known as:  LOVENOX  Inject 0.8 mLs (80 mg total) into the skin daily.     FERRETTS 325 (106 FE) MG Tabs tablet  Generic drug:  ferrous fumarate  TAKE 1 TABLET BY MOUTH ON AN EMPTY STOMACH WITH ORANGE JUICE     hydrOXYzine 25 MG capsule  Commonly known as:  VISTARIL  Take 25-50 mg by mouth at bedtime as needed for itching.     lidocaine-prilocaine cream  Commonly known as:  EMLA  Apply to Porta-Cath site 1-2 hours prior to access as directed.     Melatonin 10 MG Tbdp  Take 10 mg by mouth at bedtime.     mupirocin ointment 2 %  Commonly known as:  BACTROBAN  Apply 1 application topically 3 (three) times daily.      pantoprazole 40 MG tablet  Commonly known as:  PROTONIX  Take 1 tablet (40 mg total) by mouth daily.     polyethylene glycol packet  Commonly known as:  MIRALAX / GLYCOLAX  Take 17 g by mouth 2 (two) times daily.     silver sulfADIAZINE 1 % cream  Commonly known as:  SILVADENE  Apply 1 application topically daily.     traMADol 50 MG tablet  Commonly known as:  ULTRAM  Take 1-2 tablets (50-100 mg total) by mouth every 8 (eight) hours as needed.     triamcinolone cream 0.1 %  Commonly known as:  KENALOG  Apply 1 application topically 2 (two) times daily.         PHYSICAL EXAMINATION  Oncology Vitals 02/05/2015 02/05/2015 02/05/2015 01/28/2015 01/28/2015 01/14/2015 01/11/2015  Height - - 155 cm - 155 cm 155 cm 155 cm  Weight - - 51.71 kg - 52.844 kg 52.436 kg 51.755 kg  Weight (lbs) - - 114 lbs - 116 lbs 8 oz 115 lbs 10 oz 114 lbs 2 oz  BMI (kg/m2) - - 21.54 kg/m2 - 22.01 kg/m2 21.84 kg/m2 21.56 kg/m2  Temp - - 97.8 - 98.2 98.4 98.3  Pulse 78 126 119 100 101 89 103  Resp 18 - - 20 18 18 16  SpO2 97 - 96 100 - 97 100  BSA (m2) - - 1.49 m2 - 1.51 m2 1.5 m2 1.49 m2   BP Readings from Last 3 Encounters:  02/05/15 90/55  02/05/15 76/60  01/28/15 100/66    Physical Exam  Constitutional: She is oriented to person, place, and time. Vital signs are normal. She appears malnourished. She appears unhealthy. She appears cachectic.  HENT:  Head: Normocephalic and atraumatic.  Eyes: Conjunctivae and EOM are normal. Pupils are equal, round, and reactive to light.  Neck: Normal range of motion.  Pulmonary/Chest: Effort normal. No respiratory distress.  Musculoskeletal: Normal range of motion. She exhibits edema. She exhibits no tenderness.  Right lower extremity with edema; but no tenderness with palpation.  Neurological: She is alert and oriented to person, place, and time. Gait normal.  Skin: Skin is warm and dry. No rash noted. No erythema. No pallor.  Psychiatric: Affect normal.    Nursing note and vitals reviewed.   LABORATORY DATA:. No visits with results within 3 Day(s) from this visit. Latest known visit with results is:  Appointment on 01/28/2015  Component Date Value Ref Range Status  . TSH 01/28/2015 2.689  0.308 - 3.960 m(IU)/L Final  . WBC 01/28/2015 6.7  3.9 - 10.3 10e3/uL Final  . NEUT# 01/28/2015 5.0  1.5 - 6.5 10e3/uL Final  . HGB 01/28/2015 11.1* 11.6 - 15.9 g/dL Final  . HCT 01/28/2015 34.5* 34.8 - 46.6 % Final  . Platelets 01/28/2015 262  145 - 400 10e3/uL Final  .   MCV 01/28/2015 89.1  79.5 - 101.0 fL Final  . MCH 01/28/2015 28.7  25.1 - 34.0 pg Final  . MCHC 01/28/2015 32.2  31.5 - 36.0 g/dL Final  . RBC 01/28/2015 3.87  3.70 - 5.45 10e6/uL Final  . RDW 01/28/2015 15.1* 11.2 - 14.5 % Final  . lymph# 01/28/2015 0.8* 0.9 - 3.3 10e3/uL Final  . MONO# 01/28/2015 0.7  0.1 - 0.9 10e3/uL Final  . Eosinophils Absolute 01/28/2015 0.2  0.0 - 0.5 10e3/uL Final  . Basophils Absolute 01/28/2015 0.0  0.0 - 0.1 10e3/uL Final  . NEUT% 01/28/2015 75.4  38.4 - 76.8 % Final  . LYMPH% 01/28/2015 11.7* 14.0 - 49.7 % Final  . MONO% 01/28/2015 10.6  0.0 - 14.0 % Final  . EOS% 01/28/2015 2.2  0.0 - 7.0 % Final  . BASO% 01/28/2015 0.1  0.0 - 2.0 % Final  . Sodium 01/28/2015 139  136 - 145 mEq/L Final  . Potassium 01/28/2015 3.9  3.5 - 5.1 mEq/L Final  . Chloride 01/28/2015 101  98 - 109 mEq/L Final  . CO2 01/28/2015 27  22 - 29 mEq/L Final  . Glucose 01/28/2015 106  70 - 140 mg/dl Final   Glucose reference range is for nonfasting patients. Fasting glucose reference range is 70- 100.  . BUN 01/28/2015 14.0  7.0 - 26.0 mg/dL Final  . Creatinine 01/28/2015 1.1  0.6 - 1.1 mg/dL Final  . Total Bilirubin 01/28/2015 0.34  0.20 - 1.20 mg/dL Final  . Alkaline Phosphatase 01/28/2015 126  40 - 150 U/L Final  . AST 01/28/2015 11  5 - 34 U/L Final  . ALT 01/28/2015 <6  0 - 55 U/L Final  . Total Protein 01/28/2015 7.1  6.4 - 8.3 g/dL Final  . Albumin 01/28/2015 3.2* 3.5 -  5.0 g/dL Final  . Calcium 01/28/2015 11.2* 8.4 - 10.4 mg/dL Final  . Anion Gap 01/28/2015 10  3 - 11 mEq/L Final  . EGFR 01/28/2015 57* >90 ml/min/1.73 m2 Final   eGFR is calculated using the CKD-EPI Creatinine Equation (2009)  . Protein, ur 01/28/2015 Negative  Negative- <30 mg/dL Final   Doppler US: Summary:  - Findings consistent with acute, occlusive deep vein thrombosis involving the right common femoral vein, right femoral vein, right popliteal vein, right posterial tibial vein, right peroneal vein, and right gastrocnemius vein. - No evidence of deep vein thrombosis involving the left lower extremity.  RADIOGRAPHIC STUDIES: No results found.  ASSESSMENT/PLAN:     Primary vulvar cancer Patient has been diagnosed in the past with both lung cancer and vulvar cancer.  Patient last received Nivolumab on 01/22/2015.  Recent restaging scans revealed progression.  The plan is for the patient to initiate Taxotere/Cyramza chemotherapy; with Neulasta support on 02/11/2015.  Patient is scheduled for labs and her first cycle of chemotherapy on 02/11/2015.  DVT (deep venous thrombosis) Patient reports right leg edema and discomfort for the past several days.  She denies any chest pain, chest pressure, shortness breath, or pain with inspiration.  On exam.-Patient's right leg is significantly edematous; but no obvious tenderness with palpation.  All pulses are palpable extremities are warm.  Doppler ultrasound obtained today revealed occlusive acute DVT in the right common femoral, femoral, popliteal, gastroc, posterior tibial, and peroneal veins.  No DVT noted to the left lower extremity.  After reviewing all findings with Dr. Livesay-decision was made to initiate Lovenox therapy for treatment of DVT.  [facility] nurse.  Carefully reviewed all Lovenox education   with the patient; and the patient gave herself the first Lovenox injection.  Patient will be prescribed Lovenox 1.5  mg/kg on a daily basis; which equals 80 mg injection once per day.     Patient stated understanding of all instructions; and was in agreement with this plan of care. The patient knows to call the clinic with any problems, questions or concerns.   Review/collaboration with Dr. Marko Plume regarding all aspects of patient's visit today.   Total time spent with patient was 25 minutes;  with greater than 75 percent of that time spent in face to face counseling regarding patient's symptoms,  and coordination of care and follow up.  Disclaimer:This dictation was prepared with Dragon/digital dictation along with [employer]. Any transcriptional errors that result from this process are unintentional.  Drue Second, NP 02/07/2015

## 2015-02-07 NOTE — Assessment & Plan Note (Signed)
Patient reports right leg edema and discomfort for the past several days.  She denies any chest pain, chest pressure, shortness breath, or pain with inspiration.  On exam.-Patient's right leg is significantly edematous; but no obvious tenderness with palpation.  All pulses are palpable extremities are warm.  Doppler ultrasound obtained today revealed occlusive acute DVT in the right common femoral, femoral, popliteal, gastroc, posterior tibial, and peroneal veins.  No DVT noted to the left lower extremity.  After reviewing all findings with Dr. Inda Castle was made to initiate Lovenox therapy for treatment of DVT.  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  Carefully reviewed all Lovenox education with the patient; and the patient gave herself the first Lovenox injection.  Patient will be prescribed Lovenox 1.5 mg/kg on a daily basis; which equals 80 mg injection once per day.

## 2015-02-07 NOTE — Assessment & Plan Note (Signed)
Patient has been diagnosed in the past with both lung cancer and vulvar cancer.  Patient last received Nivolumab on 01/22/2015.  Recent restaging scans revealed progression.  The plan is for the patient to initiate Taxotere/Cyramza chemotherapy; with Neulasta support on 02/11/2015.  Patient is scheduled for labs and her first cycle of chemotherapy on 02/11/2015.

## 2015-02-08 ENCOUNTER — Ambulatory Visit: Payer: 59 | Attending: Radiation Oncology | Admitting: Physical Therapy

## 2015-02-08 ENCOUNTER — Telehealth: Payer: Self-pay | Admitting: *Deleted

## 2015-02-08 ENCOUNTER — Telehealth: Payer: Self-pay | Admitting: Oncology

## 2015-02-08 NOTE — Telephone Encounter (Signed)
-----   Message from Gordy Levan, MD sent at 02/07/2015  7:35 PM EDT ----- Please check on her by phone on 9-26 re lovenox injections - be sure she was able to fill prescription and is ok with injections. I have asked schedulers to cancel PT planned 9-26, will not reschedule now due to the DVT. I have asked schedulers to make apt to me 9-29 or 9-30  thanks

## 2015-02-08 NOTE — Telephone Encounter (Signed)
email to dr Marko Plume regarding pof from cindy bacon for 9/29 9/30 appoitment

## 2015-02-08 NOTE — Telephone Encounter (Signed)
Left message for patient to call us back.  

## 2015-02-09 ENCOUNTER — Other Ambulatory Visit: Payer: Self-pay | Admitting: Oncology

## 2015-02-09 DIAGNOSIS — IMO0002 Reserved for concepts with insufficient information to code with codable children: Secondary | ICD-10-CM

## 2015-02-09 DIAGNOSIS — C7801 Secondary malignant neoplasm of right lung: Secondary | ICD-10-CM

## 2015-02-09 DIAGNOSIS — C7802 Secondary malignant neoplasm of left lung: Secondary | ICD-10-CM

## 2015-02-09 DIAGNOSIS — C799 Secondary malignant neoplasm of unspecified site: Secondary | ICD-10-CM

## 2015-02-09 NOTE — Addendum Note (Signed)
Encounter addended by: Benn Moulder, RN on: 02/09/2015  3:51 PM<BR>     Documentation filed: Charges VN

## 2015-02-10 ENCOUNTER — Ambulatory Visit: Payer: 59 | Admitting: Nutrition

## 2015-02-10 ENCOUNTER — Encounter: Payer: 59 | Admitting: Nutrition

## 2015-02-10 ENCOUNTER — Institutional Professional Consult (permissible substitution): Payer: Self-pay | Admitting: Radiation Oncology

## 2015-02-10 ENCOUNTER — Telehealth: Payer: Self-pay | Admitting: Oncology

## 2015-02-10 NOTE — Progress Notes (Signed)
Nutrition follow-up completed with 64 year old female diagnosed with vulvar and lung cancer.  Past medical history includes radiation and chemotherapy.  Medications include Protonix, and MiraLAX.  Labs include albumin 3.2 on September 15.  Height: 5 feet 1 inch. Weight: 114.2 pounds on September 28. Usual body weight: 127 pounds July 2016. BMI: 21.6.  Patient endorses approximately 10-12 pound weight loss over the last 2 months. She states increased pain, early satiety and taste alterations limit her oral intake. Patient consumes very small "snacks" 3 times a day. She tolerates about 4 ounces of Carnation breakfast essentials daily.  Nutrition diagnosis: Unintended weight loss related to inadequate oral intake as evidenced by 10% weight loss in 2 months.  Intervention: Educated patient to consume small amounts of food, and high-calorie beverages every 2 hours. Recommended patient continue Carnation breakfast essentials or try a juice flavored oral nutrition supplement. Samples were provided. Reviewed strategies for adding calories and protein and provided fact sheets with list of high-calorie, high-protein foods. Encouraged patient to increase overall fluid intake as tolerated. Questions were answered.  Teach back method was used.  Contact information was provided.  Monitoring, evaluation, goals: Patient will tolerate increased calories and protein to minimize further weight loss.  Next visit: Thursday, October 6.    **Disclaimer: This note was dictated with voice recognition software. Similar sounding words can inadvertently be transcribed and this note may contain transcription errors which may not have been corrected upon publication of note.**

## 2015-02-10 NOTE — Telephone Encounter (Signed)
Called patient and she is aware of her 9/30 appoitments

## 2015-02-10 NOTE — Telephone Encounter (Signed)
This RN verified pt understands new schedule. No problems with lovenox injections and no additional questions at this time.  Appointment schedule verified.

## 2015-02-10 NOTE — Telephone Encounter (Signed)
-----   Message from Gordy Levan, MD sent at 02/09/2015 10:05 PM EDT ----- Please let patient know that we need to hold chemo on 9-29 due to the new DVT. I will discuss it with her when I see her with lab on 9-30.   Be sure she is doing ok on the lovenox. I am not sure RN reached her on 9-26.  POF sent urgent to schedulers now  Thank you

## 2015-02-11 ENCOUNTER — Ambulatory Visit: Payer: 59

## 2015-02-11 ENCOUNTER — Other Ambulatory Visit: Payer: 59

## 2015-02-12 ENCOUNTER — Other Ambulatory Visit: Payer: Self-pay | Admitting: *Deleted

## 2015-02-12 ENCOUNTER — Ambulatory Visit (HOSPITAL_BASED_OUTPATIENT_CLINIC_OR_DEPARTMENT_OTHER): Payer: 59 | Admitting: Oncology

## 2015-02-12 ENCOUNTER — Ambulatory Visit: Payer: 59

## 2015-02-12 ENCOUNTER — Other Ambulatory Visit (HOSPITAL_BASED_OUTPATIENT_CLINIC_OR_DEPARTMENT_OTHER): Payer: 59

## 2015-02-12 ENCOUNTER — Encounter: Payer: Self-pay | Admitting: Oncology

## 2015-02-12 VITALS — BP 113/64 | HR 118 | Temp 98.4°F | Resp 18 | Ht 61.0 in | Wt 113.5 lb

## 2015-02-12 DIAGNOSIS — D6481 Anemia due to antineoplastic chemotherapy: Secondary | ICD-10-CM

## 2015-02-12 DIAGNOSIS — C3432 Malignant neoplasm of lower lobe, left bronchus or lung: Secondary | ICD-10-CM

## 2015-02-12 DIAGNOSIS — C799 Secondary malignant neoplasm of unspecified site: Secondary | ICD-10-CM

## 2015-02-12 DIAGNOSIS — C3411 Malignant neoplasm of upper lobe, right bronchus or lung: Secondary | ICD-10-CM

## 2015-02-12 DIAGNOSIS — C519 Malignant neoplasm of vulva, unspecified: Secondary | ICD-10-CM

## 2015-02-12 DIAGNOSIS — IMO0002 Reserved for concepts with insufficient information to code with codable children: Secondary | ICD-10-CM

## 2015-02-12 DIAGNOSIS — L270 Generalized skin eruption due to drugs and medicaments taken internally: Secondary | ICD-10-CM

## 2015-02-12 DIAGNOSIS — I82401 Acute embolism and thrombosis of unspecified deep veins of right lower extremity: Secondary | ICD-10-CM

## 2015-02-12 DIAGNOSIS — C778 Secondary and unspecified malignant neoplasm of lymph nodes of multiple regions: Secondary | ICD-10-CM | POA: Diagnosis not present

## 2015-02-12 DIAGNOSIS — Z66 Do not resuscitate: Secondary | ICD-10-CM

## 2015-02-12 DIAGNOSIS — Z95828 Presence of other vascular implants and grafts: Secondary | ICD-10-CM

## 2015-02-12 DIAGNOSIS — Z23 Encounter for immunization: Secondary | ICD-10-CM | POA: Diagnosis not present

## 2015-02-12 DIAGNOSIS — K5900 Constipation, unspecified: Secondary | ICD-10-CM

## 2015-02-12 DIAGNOSIS — C541 Malignant neoplasm of endometrium: Secondary | ICD-10-CM | POA: Diagnosis not present

## 2015-02-12 DIAGNOSIS — C7802 Secondary malignant neoplasm of left lung: Secondary | ICD-10-CM

## 2015-02-12 DIAGNOSIS — C792 Secondary malignant neoplasm of skin: Secondary | ICD-10-CM | POA: Diagnosis not present

## 2015-02-12 DIAGNOSIS — Z7901 Long term (current) use of anticoagulants: Secondary | ICD-10-CM

## 2015-02-12 DIAGNOSIS — C3492 Malignant neoplasm of unspecified part of left bronchus or lung: Secondary | ICD-10-CM

## 2015-02-12 DIAGNOSIS — C7801 Secondary malignant neoplasm of right lung: Secondary | ICD-10-CM

## 2015-02-12 DIAGNOSIS — K219 Gastro-esophageal reflux disease without esophagitis: Secondary | ICD-10-CM

## 2015-02-12 LAB — COMPREHENSIVE METABOLIC PANEL (CC13)
ALBUMIN: 3 g/dL — AB (ref 3.5–5.0)
ALT: 21 U/L (ref 0–55)
AST: 24 U/L (ref 5–34)
Alkaline Phosphatase: 133 U/L (ref 40–150)
Anion Gap: 13 mEq/L — ABNORMAL HIGH (ref 3–11)
BUN: 10.1 mg/dL (ref 7.0–26.0)
CALCIUM: 10.2 mg/dL (ref 8.4–10.4)
CHLORIDE: 100 meq/L (ref 98–109)
CO2: 24 mEq/L (ref 22–29)
CREATININE: 0.9 mg/dL (ref 0.6–1.1)
EGFR: 70 mL/min/{1.73_m2} — ABNORMAL LOW (ref >90)
Glucose: 99 mg/dl (ref 70–140)
Potassium: 4.2 mEq/L (ref 3.5–5.1)
Sodium: 137 mEq/L (ref 136–145)
TOTAL PROTEIN: 7 g/dL (ref 6.4–8.3)
Total Bilirubin: 0.32 mg/dL (ref 0.20–1.20)

## 2015-02-12 LAB — TECHNOLOGIST REVIEW

## 2015-02-12 LAB — CBC WITH DIFFERENTIAL/PLATELET
BASO%: 1.4 % (ref 0.0–2.0)
BASOS ABS: 0.1 10*3/uL (ref 0.0–0.1)
EOS ABS: 0.1 10*3/uL (ref 0.0–0.5)
EOS%: 0.7 % (ref 0.0–7.0)
HEMATOCRIT: 34.1 % — AB (ref 34.8–46.6)
HEMOGLOBIN: 11.3 g/dL — AB (ref 11.6–15.9)
LYMPH#: 0.7 10*3/uL — AB (ref 0.9–3.3)
LYMPH%: 7.9 % — ABNORMAL LOW (ref 14.0–49.7)
MCH: 28.2 pg (ref 25.1–34.0)
MCHC: 33.1 g/dL (ref 31.5–36.0)
MCV: 85.1 fL (ref 79.5–101.0)
MONO#: 0.6 10*3/uL (ref 0.1–0.9)
MONO%: 6.2 % (ref 0.0–14.0)
NEUT#: 7.7 10*3/uL — ABNORMAL HIGH (ref 1.5–6.5)
NEUT%: 83.8 % — ABNORMAL HIGH (ref 38.4–76.8)
PLATELETS: 382 10*3/uL (ref 145–400)
RBC: 4.01 10*6/uL (ref 3.70–5.45)
RDW: 15.8 % — AB (ref 11.2–14.5)
WBC: 9.2 10*3/uL (ref 3.9–10.3)

## 2015-02-12 LAB — PROTIME-INR
INR: 1.1 — ABNORMAL LOW (ref 2.00–3.50)
Protime: 13.2 Seconds (ref 10.6–13.4)

## 2015-02-12 MED ORDER — ENOXAPARIN SODIUM 80 MG/0.8ML ~~LOC~~ SOLN: 80.0000 mg | SUBCUTANEOUS | Status: AC

## 2015-02-12 MED ORDER — INFLUENZA VAC SPLIT QUAD 0.5 ML IM SUSY
0.5000 mL | PREFILLED_SYRINGE | Freq: Once | INTRAMUSCULAR | Status: AC
  Administered 2015-02-12: 0.5 mL via INTRAMUSCULAR
  Filled 2015-02-12: qty 0.5

## 2015-02-12 NOTE — Progress Notes (Signed)
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  Planned cycle 1 taxotere cyramza was held last week due to the DVT. Cyramza has not been studied in [facility] lung on anticoagulation, as risk of hemorrhage excessive; patient understands from our conversation today that she will not receive cyramza.  Patient is giving own lovenox injections at 1.5 mg/kg once daily. She has had no bleeding. Swelling in RLE is stable or slightly improved. 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 That area is not painful. She has been more SOB with exertion such as walking a moderate distance from house to car. She denies chest pain, cough, wheezing, sputum or hemoptysis. She has not needed steroids for skin in past 2 weeks. Bowels are moving. Appetite is decreased, without nausea or vomiting.   PAC in flu vaccine given today (02-12-15)  ONCOLOGIC HISTORY Patient presented with gradually enlarging mass at right vulva for ~ 8 months, uncomfortable with direct pressure and ulcerated with occasional bleeding. She was seen in ED on 02-02-14 with right inguinal adenopathy and right vulvar masses, by Dr Elonda Husky on 02-03-14, then by Dr Skeet Latch on 02-05-14, with biopsy of both vulvar mass and satellite lesions on inner thigh and mons. Exam found 7 cm right vulvar mass with 4 cm extension to distal vagina, extending anteriorly to urethral meatus and in ischiorectal area, with satellite  lesions inner thigh and right mons. Pathology [id number]) showed invasive squamous cell carcinoma arising in background of high grade squamous intraepithelial lesion from vulva biopsy and invasive moderately differentiated squamous cell carcinoma from inner thigh lesion. CXR 02-06-14 showed left lower lobe lung mass 3.6 x 3.6 x 3.6 cm, with no adenopathy and no bony lesions, emphysematous changes. CT chest 02-09-14 showed the lower lobe mass 3.7 x 3.5 x 3.3 cm as well as 3 mm RLL nodule and faint 4 mm LUL nodule, no mediastinal or hilar nodes, no axillary or supraclavicular nodes, and upper abdomen not remarkable. PET 02-18-14 measured LLL lung mass 4.6 x 3.9 cm, hypermetabolic, and noted multiple subcentimeter nodules scattered thruout lungs bilaterally; the right vulvar mass was also hypermetabolic, extending up into right side of lower vagina, extensive R>L and right external iliac adenopathy, no uptake in skeleton. 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 She had 3 stereotactic radiation treatments to the LLL pulmonary mass 54 Gy in 3 fractions 10-16, 10-19 and 03-04-14. She received IMRT to vulvar area from 03-09-14, completed 04-23-14, with weekly CDDP x7 03-09-14 thru 04-20-14. Radiation to vulva, vaginal, and pelvic/inguinal nodes was 60 Gy in 30 fractions to gross disease, 54 Gy in 30 fractions to high risk areas, 50.4Gy in 30 fractions to intermediate risk areas. She had vulvar, vaginal, and nodal boost of 6 Gy in 3 fractions to gross disease. Follow up CT chest 05-04-14 had cavitary, spiculated LLL mass 3.3 x 2.3 cm, having been 4.6 x 3.9 cm prior to IMRT, and no change in scattered small bilateral  pulmonary nodules, however PET 05-27-14 had progression in bilateral pulmonary nodules and partial response in local vulvar involvement. 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 CT CAP  08-10-14 had majority of lung nodules stable with a few slightly increased, 2 mm hepatic lucencies possibly cysts, no adenopathy AP. She had first nivolumab 09-14-14, dose reduced by 50% due to concerns for skin toxicity. Nivolumab dose increased to 2 mg/kg starting cycle 4 on 11-19-14 and 3 mg/kg with treatment 12-31-14.    Review of systems as above, also: No fever. She is not complaining of the right groin pain as much, but has been more sedentary since DVT. She is voiding. No problems with PAC.  Remainder of 10 point Review of Systems negative.  Objective:  Vital signs in last 24 hours:  BP 113/64 mmHg  Pulse 118  Temp(Src) 98.4 F (36.9 C) (Oral)  Resp 18  Ht 5' 1"  (1.549 m)  Wt 113 lb 8 oz (51.483 kg)  BMI 21.46 kg/m2  SpO2 99% Weight down 3 lbs Alert, oriented and appropriate. Ambulatory without assistance but uses WC to leave office. Respirations slightly labored in exam room, note O2 sat 99%.   HEENT:PERRL, sclerae not icteric. Oral mucosa somewhat dry without lesions, posterior pharynx clear.  Neck supple. No JVD.  Lymphatics:no cervical,supraclavicular adenopathy Resp: diminished BS thruout without wheezes or rales and no dullness to percussion bilaterally Cardio: regular rate and rhythm. No gallop. GI: soft, nontender, not distended, no mass or organomegaly. Some bowel sounds.  Musculoskeletal/ Extremities:RLE 1-2+ swelling to foot otherwise without pitting edema, cords, tenderness Neuro: no increase in peripheral neuropathy. Otherwise nonfocal. PSYCH appropriate mood and affect Skin diffuse texture change and dryness without rash, ecchymosis, petechiae. Induration right groin. 2 cm superficial ulceration right mons without drainage, tenderness, bleeding. Portacath-without erythema or tenderness  Lab Results:  Results for orders placed or performed in visit on 02/12/15  TECHNOLOGIST REVIEW  Result Value Ref Range   Technologist Review 2% myelocytes   CBC with  Differential  Result Value Ref Range   WBC 9.2 3.9 - 10.3 10e3/uL   NEUT# 7.7 (H) 1.5 - 6.5 10e3/uL   HGB 11.3 (L) 11.6 - 15.9 g/dL   HCT 34.1 (L) 34.8 - 46.6 %   Platelets 382 145 - 400 10e3/uL   MCV 85.1 79.5 - 101.0 fL   MCH 28.2 25.1 - 34.0 pg   MCHC 33.1 31.5 - 36.0 g/dL   RBC 4.01 3.70 - 5.45 10e6/uL   RDW 15.8 (H) 11.2 - 14.5 %   lymph# 0.7 (L) 0.9 - 3.3 10e3/uL   MONO# 0.6 0.1 - 0.9 10e3/uL   Eosinophils Absolute 0.1 0.0 - 0.5 10e3/uL   Basophils Absolute 0.1 0.0 - 0.1 10e3/uL   NEUT% 83.8 (H) 38.4 - 76.8 %   LYMPH% 7.9 (L) 14.0 - 49.7 %   MONO% 6.2 0.0 - 14.0 %   EOS% 0.7 0.0 - 7.0 %   BASO% 1.4 0.0 - 2.0 %  Protime-INR  Result Value Ref Range   Protime 13.2 10.6 - 13.4 Seconds   INR 1.10 (L) 2.00 - 3.50   Lovenox YES   Comprehensive metabolic panel (Cmet) - [facility]  Result Value Ref Range   Sodium 137 136 - 145 mEq/L   Potassium 4.2 3.5 - 5.1 mEq/L   Chloride 100 98 - 109 mEq/L   CO2 24 22 - 29 mEq/L   Glucose 99 70 - 140 mg/dl  BUN 10.1 7.0 - 26.0 mg/dL   Creatinine 0.9 0.6 - 1.1 mg/dL   Total Bilirubin 0.32 0.20 - 1.20 mg/dL   Alkaline Phosphatase 133 40 - 150 U/L   AST 24 5 - 34 U/L   ALT 21 0 - 55 U/L   Total Protein 7.0 6.4 - 8.3 g/dL   Albumin 3.0 (L) 3.5 - 5.0 g/dL   Calcium 10.2 8.4 - 10.4 mg/dL   Anion Gap 13 (H) 3 - 11 mEq/L   EGFR 70 (L) >90 ml/min/1.73 m2     Studies/Results:  No results found.  Medications: I have reviewed the patient's current medications. I have recommended continuing lovenox indefinitely, patient in agreement.  DISCUSSION: Cannot use Cyramza with anticoagulation for extensive RLE DVT documented 02-05-15. Patient wants to proceed with taxotere in attempt to control metastatic disease in lungs. She will use decadron 8 mg with food bid x 3 days beginning day prior to taxotere and will need neulasta ~ 24 hrs after taxotere at [facility].  Discussed need for good nutrition and po hydration. Discussed likely gradual improvement in RLE  swelling  Assessment/Plan: 1.advanced squamous cell carcinoma of vulva involving distal vagina, right inguinal and external iliac nodes, with metastatic disease to skin of right thigh and possibly to lung. Improved local disease with RT/ sensitizing CDDP completed 04-23-14, and additional taxol CDDP thru 07-27-14. Pelvic CT 12-28-14 does not suggest local progression. Severe local pain resolved with that treatment. Increased induration right groin, where most symptomatic skin reaction after radiation, may be combination of radiation effects + nivolumab. 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 Post SBRT to dominant LLL mass. Progressive, extensive pulmonary mets including last 2 doses nivolumab. Cannot use ramucirumab as planned due to DVT and anticoagulation. Will try docetaxel in attempt to improve/ control disease, tho unless she gets prompt response she likely will not be able to continue aggressive treatment much longer. She would be appropriate for Hospice when chemo is no longer used. Prn albuterol inhaler 3.RLE DVT extensive, found with venous doppler 02-05-15. Continue lovenox indefinitely, or unless bleeding, significant thrombocytopenia, or terminal stages of metastatic cancer. 4.History of severe dermatitis and eczema since childhood, with severe flares during initial nivolumab treatment. Dermatology available if needed, their help appreciated. 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 With other skin texture changes on nivolumab, possibly this thickening is related to radiation + nivolumab. Similar situation right groin, where discomfort  seems local as opposed to referred from hip 9. Advance Directives done. Patient understands that treatment is not expected to be curative.  10.increased GERD controlled with protonix daily  11.anemia related to chemo and pelvic RT: improved, continue oral iron. Thrombocytopenia resolved. 12constipation from nivolumab improved with bowel regimen now 13. flu vaccine given 02-12-15   All questions answered and patient/ daughter know to call if concerns prior to next scheduled visit. Message to RN to review decadron prior to first taxotere; depending on timing of first treatment and overall situation may not need my apt as scheduled on 02-18-15 Chemo orders changed, taxotere only.  Time spent 30 min including >50% counseling and coordination of care.  LIVESAY,LENNIS P, MD   02/12/2015, 2:18 PM

## 2015-02-14 ENCOUNTER — Other Ambulatory Visit: Payer: Self-pay | Admitting: Oncology

## 2015-02-14 DIAGNOSIS — Z95828 Presence of other vascular implants and grafts: Secondary | ICD-10-CM | POA: Insufficient documentation

## 2015-02-14 DIAGNOSIS — Z7901 Long term (current) use of anticoagulants: Secondary | ICD-10-CM | POA: Insufficient documentation

## 2015-02-14 DIAGNOSIS — Z23 Encounter for immunization: Secondary | ICD-10-CM | POA: Insufficient documentation

## 2015-02-14 DIAGNOSIS — I82401 Acute embolism and thrombosis of unspecified deep veins of right lower extremity: Secondary | ICD-10-CM | POA: Insufficient documentation

## 2015-02-14 DIAGNOSIS — C519 Malignant neoplasm of vulva, unspecified: Secondary | ICD-10-CM | POA: Insufficient documentation

## 2015-02-14 DIAGNOSIS — Z66 Do not resuscitate: Secondary | ICD-10-CM | POA: Insufficient documentation

## 2015-02-15 ENCOUNTER — Telehealth: Payer: Self-pay | Admitting: *Deleted

## 2015-02-15 NOTE — Telephone Encounter (Signed)
Per staff message and POF I have scheduled appts. Advised scheduler of appts. JMW  

## 2015-02-16 ENCOUNTER — Other Ambulatory Visit: Payer: Self-pay | Admitting: Oncology

## 2015-02-16 ENCOUNTER — Telehealth: Payer: Self-pay

## 2015-02-16 DIAGNOSIS — C519 Malignant neoplasm of vulva, unspecified: Secondary | ICD-10-CM

## 2015-02-16 MED ORDER — DEXAMETHASONE 4 MG PO TABS: ORAL_TABLET | ORAL | Status: DC

## 2015-02-16 NOTE — Telephone Encounter (Signed)
Reviewed decadron premed as noted below by Dr. Marko Plume. Jody Beard does not feel any worse then she did at visit on 02-12-15.  Told her that Dr. Marko Plume would  still like to see her on 02-18-15 as scheduled.  Appointment set up for neulasta on Saturday 1-=8=16 at 1330. Reviewed aches from neulasta and use of Claritin 10 mg daily for ~10 days around the injection to help with the aches as well as heat, tylenol, or pain medication if needed.  Ms. Kruk verbalized understanding.

## 2015-02-16 NOTE — Telephone Encounter (Signed)
-----   Message from Gordy Levan, MD sent at 02/14/2015  8:20 PM EDT ----- Not sure when she will get first taxotere due to infusion schedule.  May not need my apt on 10-6, tho if she is having more trouble would need to see her.   RN please watch for taxotere apt - need to go over decadron 8 mg bid with food x 3 days starting day prior to taxotere. She needs to let us know if she has enough decadron on hand for this. Remind her of neulasta apt afterwards. Can talk with her also about keeping 10-6 apt or not  -- thanks

## 2015-02-18 ENCOUNTER — Encounter: Payer: Self-pay | Admitting: Oncology

## 2015-02-18 ENCOUNTER — Other Ambulatory Visit (HOSPITAL_BASED_OUTPATIENT_CLINIC_OR_DEPARTMENT_OTHER): Payer: 59

## 2015-02-18 ENCOUNTER — Ambulatory Visit: Payer: 59 | Admitting: Nutrition

## 2015-02-18 ENCOUNTER — Ambulatory Visit (HOSPITAL_BASED_OUTPATIENT_CLINIC_OR_DEPARTMENT_OTHER): Payer: 59

## 2015-02-18 ENCOUNTER — Ambulatory Visit (HOSPITAL_BASED_OUTPATIENT_CLINIC_OR_DEPARTMENT_OTHER): Payer: 59 | Admitting: Oncology

## 2015-02-18 VITALS — BP 117/58 | HR 74 | Temp 98.2°F | Resp 20

## 2015-02-18 VITALS — BP 117/69 | HR 102 | Temp 97.5°F | Resp 18 | Ht 61.0 in | Wt 111.9 lb

## 2015-02-18 DIAGNOSIS — C3432 Malignant neoplasm of lower lobe, left bronchus or lung: Secondary | ICD-10-CM

## 2015-02-18 DIAGNOSIS — C7801 Secondary malignant neoplasm of right lung: Secondary | ICD-10-CM

## 2015-02-18 DIAGNOSIS — Z5111 Encounter for antineoplastic chemotherapy: Secondary | ICD-10-CM

## 2015-02-18 DIAGNOSIS — C792 Secondary malignant neoplasm of skin: Secondary | ICD-10-CM

## 2015-02-18 DIAGNOSIS — C541 Malignant neoplasm of endometrium: Secondary | ICD-10-CM | POA: Diagnosis not present

## 2015-02-18 DIAGNOSIS — C7802 Secondary malignant neoplasm of left lung: Principal | ICD-10-CM

## 2015-02-18 DIAGNOSIS — C3411 Malignant neoplasm of upper lobe, right bronchus or lung: Secondary | ICD-10-CM

## 2015-02-18 DIAGNOSIS — C799 Secondary malignant neoplasm of unspecified site: Secondary | ICD-10-CM

## 2015-02-18 DIAGNOSIS — IMO0002 Reserved for concepts with insufficient information to code with codable children: Secondary | ICD-10-CM

## 2015-02-18 DIAGNOSIS — R1030 Lower abdominal pain, unspecified: Secondary | ICD-10-CM

## 2015-02-18 DIAGNOSIS — C519 Malignant neoplasm of vulva, unspecified: Secondary | ICD-10-CM

## 2015-02-18 DIAGNOSIS — C3492 Malignant neoplasm of unspecified part of left bronchus or lung: Secondary | ICD-10-CM

## 2015-02-18 DIAGNOSIS — C778 Secondary and unspecified malignant neoplasm of lymph nodes of multiple regions: Secondary | ICD-10-CM | POA: Diagnosis not present

## 2015-02-18 DIAGNOSIS — I82401 Acute embolism and thrombosis of unspecified deep veins of right lower extremity: Secondary | ICD-10-CM

## 2015-02-18 DIAGNOSIS — Z95828 Presence of other vascular implants and grafts: Secondary | ICD-10-CM

## 2015-02-18 DIAGNOSIS — D6481 Anemia due to antineoplastic chemotherapy: Secondary | ICD-10-CM

## 2015-02-18 DIAGNOSIS — C3431 Malignant neoplasm of lower lobe, right bronchus or lung: Secondary | ICD-10-CM

## 2015-02-18 DIAGNOSIS — R634 Abnormal weight loss: Secondary | ICD-10-CM

## 2015-02-18 DIAGNOSIS — Z7901 Long term (current) use of anticoagulants: Secondary | ICD-10-CM

## 2015-02-18 DIAGNOSIS — K219 Gastro-esophageal reflux disease without esophagitis: Secondary | ICD-10-CM

## 2015-02-18 DIAGNOSIS — R0609 Other forms of dyspnea: Secondary | ICD-10-CM

## 2015-02-18 LAB — CBC WITH DIFFERENTIAL/PLATELET
BASO%: 0.2 % (ref 0.0–2.0)
Basophils Absolute: 0 10*3/uL (ref 0.0–0.1)
EOS%: 0 % (ref 0.0–7.0)
Eosinophils Absolute: 0 10*3/uL (ref 0.0–0.5)
HEMATOCRIT: 34.1 % — AB (ref 34.8–46.6)
HEMOGLOBIN: 11 g/dL — AB (ref 11.6–15.9)
LYMPH#: 0.6 10*3/uL — AB (ref 0.9–3.3)
LYMPH%: 6 % — ABNORMAL LOW (ref 14.0–49.7)
MCH: 27.5 pg (ref 25.1–34.0)
MCHC: 32.1 g/dL (ref 31.5–36.0)
MCV: 85.6 fL (ref 79.5–101.0)
MONO#: 0.3 10*3/uL (ref 0.1–0.9)
MONO%: 3.4 % (ref 0.0–14.0)
NEUT%: 90.4 % — ABNORMAL HIGH (ref 38.4–76.8)
NEUTROS ABS: 9.4 10*3/uL — AB (ref 1.5–6.5)
Platelets: 439 10*3/uL — ABNORMAL HIGH (ref 145–400)
RBC: 3.98 10*6/uL (ref 3.70–5.45)
RDW: 16.1 % — AB (ref 11.2–14.5)
WBC: 10.4 10*3/uL — AB (ref 3.9–10.3)

## 2015-02-18 LAB — COMPREHENSIVE METABOLIC PANEL (CC13)
ALBUMIN: 3.2 g/dL — AB (ref 3.5–5.0)
ALK PHOS: 130 U/L (ref 40–150)
ALT: 16 U/L (ref 0–55)
AST: 15 U/L (ref 5–34)
Anion Gap: 5 mEq/L (ref 3–11)
BUN: 14.9 mg/dL (ref 7.0–26.0)
CALCIUM: 10.1 mg/dL (ref 8.4–10.4)
CO2: 30 mEq/L — ABNORMAL HIGH (ref 22–29)
CREATININE: 0.8 mg/dL (ref 0.6–1.1)
Chloride: 104 mEq/L (ref 98–109)
EGFR: 74 mL/min/{1.73_m2} — ABNORMAL LOW (ref >90)
GLUCOSE: 146 mg/dL — AB (ref 70–140)
Potassium: 3.9 mEq/L (ref 3.5–5.1)
Sodium: 138 mEq/L (ref 136–145)
Total Bilirubin: <0.3 mg/dL (ref 0.20–1.20)
Total Protein: 7.3 g/dL (ref 6.4–8.3)

## 2015-02-18 MED ORDER — ACETAMINOPHEN 325 MG PO TABS
ORAL_TABLET | ORAL | Status: AC
  Filled 2015-02-18: qty 2

## 2015-02-18 MED ORDER — SODIUM CHLORIDE 0.9 % IJ SOLN
10.0000 mL | INTRAMUSCULAR | Status: DC | PRN
  Administered 2015-02-18: 10 mL
  Filled 2015-02-18: qty 10

## 2015-02-18 MED ORDER — DOCETAXEL CHEMO INJECTION 160 MG/16ML
50.0000 mg/m2 | Freq: Once | INTRAVENOUS | Status: AC
  Administered 2015-02-18: 80 mg via INTRAVENOUS
  Filled 2015-02-18: qty 8

## 2015-02-18 MED ORDER — DIPHENHYDRAMINE HCL 50 MG/ML IJ SOLN
INTRAMUSCULAR | Status: AC
  Filled 2015-02-18: qty 1

## 2015-02-18 MED ORDER — SODIUM CHLORIDE 0.9 % IV SOLN
Freq: Once | INTRAVENOUS | Status: AC
  Administered 2015-02-18: 13:00:00 via INTRAVENOUS
  Filled 2015-02-18: qty 4

## 2015-02-18 MED ORDER — SODIUM CHLORIDE 0.9 % IV SOLN
Freq: Once | INTRAVENOUS | Status: AC
  Administered 2015-02-18: 13:00:00 via INTRAVENOUS

## 2015-02-18 MED ORDER — ACETAMINOPHEN 325 MG PO TABS
650.0000 mg | ORAL_TABLET | Freq: Once | ORAL | Status: AC
  Administered 2015-02-18: 650 mg via ORAL

## 2015-02-18 MED ORDER — HEPARIN SOD (PORK) LOCK FLUSH 100 UNIT/ML IV SOLN
500.0000 [IU] | Freq: Once | INTRAVENOUS | Status: AC | PRN
  Administered 2015-02-18: 500 [IU]
  Filled 2015-02-18: qty 5

## 2015-02-18 MED ORDER — FAMOTIDINE IN NACL 20-0.9 MG/50ML-% IV SOLN
INTRAVENOUS | Status: AC
  Filled 2015-02-18: qty 50

## 2015-02-18 MED ORDER — DIPHENHYDRAMINE HCL 50 MG/ML IJ SOLN
50.0000 mg | Freq: Once | INTRAMUSCULAR | Status: AC
  Administered 2015-02-18: 50 mg via INTRAVENOUS

## 2015-02-18 NOTE — Patient Instructions (Signed)
Docetaxel injection  What is this medicine?  DOCETAXEL (doe se TAX el) is a chemotherapy drug. It targets fast dividing cells, like cancer cells, and causes these cells to die. This medicine is used to treat many types of cancers like breast cancer, certain stomach cancers, head and neck cancer, lung cancer, and prostate cancer.  This medicine may be used for other purposes; ask your health care provider or pharmacist if you have questions.  What should I tell my health care provider before I take this medicine?  They need to know if you have any of these conditions:  -infection (especially a virus infection such as chickenpox, cold sores, or herpes)  -liver disease  -low blood counts, like low white cell, platelet, or red cell counts  -an unusual or allergic reaction to docetaxel, polysorbate 80, other chemotherapy agents, other medicines, foods, dyes, or preservatives  -pregnant or trying to get pregnant  -breast-feeding  How should I use this medicine?  This drug is given as an infusion into a vein. It is administered in a hospital or clinic by a specially trained health care professional.  Talk to your pediatrician regarding the use of this medicine in children. Special care may be needed.  Overdosage: If you think you have taken too much of this medicine contact a poison control center or emergency room at once.  NOTE: This medicine is only for you. Do not share this medicine with others.  What if I miss a dose?  It is important not to miss your dose. Call your doctor or health care professional if you are unable to keep an appointment.  What may interact with this medicine?  -cyclosporine  -erythromycin  -ketoconazole  -medicines to increase blood counts like filgrastim, pegfilgrastim, sargramostim  -vaccines  Talk to your doctor or health care professional before taking any of these medicines:  -acetaminophen  -aspirin  -ibuprofen  -ketoprofen  -naproxen  This list may not describe all possible interactions.  Give your health care provider a list of all the medicines, herbs, non-prescription drugs, or dietary supplements you use. Also tell them if you smoke, drink alcohol, or use illegal drugs. Some items may interact with your medicine.  What should I watch for while using this medicine?  Your condition will be monitored carefully while you are receiving this medicine. You will need important blood work done while you are taking this medicine.  This drug may make you feel generally unwell. This is not uncommon, as chemotherapy can affect healthy cells as well as cancer cells. Report any side effects. Continue your course of treatment even though you feel ill unless your doctor tells you to stop.  In some cases, you may be given additional medicines to help with side effects. Follow all directions for their use.  Call your doctor or health care professional for advice if you get a fever, chills or sore throat, or other symptoms of a cold or flu. Do not treat yourself. This drug decreases your body's ability to fight infections. Try to avoid being around people who are sick.  This medicine may increase your risk to bruise or bleed. Call your doctor or health care professional if you notice any unusual bleeding.  This medicine may contain alcohol in the product. You may get drowsy or dizzy. Do not drive, use machinery, or do anything that needs mental alertness until you know how this medicine affects you. Do not stand or sit up quickly, especially if you are an older   patient. This reduces the risk of dizzy or fainting spells. Avoid alcoholic drinks.  Do not become pregnant while taking this medicine. Women should inform their doctor if they wish to become pregnant or think they might be pregnant. There is a potential for serious side effects to an unborn child. Talk to your health care professional or pharmacist for more information. Do not breast-feed an infant while taking this medicine.  What side effects may I notice  from receiving this medicine?  Side effects that you should report to your doctor or health care professional as soon as possible:  -allergic reactions like skin rash, itching or hives, swelling of the face, lips, or tongue  -low blood counts - This drug may decrease the number of white blood cells, red blood cells and platelets. You may be at increased risk for infections and bleeding.  -signs of infection - fever or chills, cough, sore throat, pain or difficulty passing urine  -signs of decreased platelets or bleeding - bruising, pinpoint red spots on the skin, black, tarry stools, nosebleeds  -signs of decreased red blood cells - unusually weak or tired, fainting spells, lightheadedness  -breathing problems  -fast or irregular heartbeat  -low blood pressure  -mouth sores  -nausea and vomiting  -pain, swelling, redness or irritation at the injection site  -pain, tingling, numbness in the hands or feet  -swelling of the ankle, feet, hands  -weight gain  Side effects that usually do not require medical attention (report to your prescriber or health care professional if they continue or are bothersome):  -bone pain  -complete hair loss including hair on your head, underarms, pubic hair, eyebrows, and eyelashes  -diarrhea  -excessive tearing  -changes in the color of fingernails  -loosening of the fingernails  -nausea  -muscle pain  -red flush to skin  -sweating  -weak or tired  This list may not describe all possible side effects. Call your doctor for medical advice about side effects. You may report side effects to FDA at 1-800-FDA-1088.  Where should I keep my medicine?  This drug is given in a hospital or clinic and will not be stored at home.  NOTE: This sheet is a summary. It may not cover all possible information. If you have questions about this medicine, talk to your doctor, pharmacist, or health care provider.      2016, [employer] Standard. (2014-05-18 16:04:57)

## 2015-02-18 NOTE — Progress Notes (Signed)
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 Metastatic disease in lungs progressed on nivolumab; she also has area that may be progressive disease on right perineum. She is on lovenox 1.5 mg/kg/daily since RLE DVT found 02-05-15.  Ramucirumab will not be used with taxotere due to anticoagulation.  Patient has felt somewhat better in past week, with appetite a little better and able to walk with cane. The RLE is not as tight at groin, and swelling some better; she has had no bleeding with lovenox and is doing injections without problems. Bowels are moving only every 2-3 days and I have told her to incease laxatives if no BM in >=2 days. She has nonpainful blistered area left upper gum x 3 days and feels left ear "blocked, echoing" tho not painful. She is SOB with increased exertion, tho was able to walk house to car today more easily than last week; occasional NP cough, no chest different chest pain tho still some discomfort left lateral chest wall.  No new or different skin symptoms. Some of improvement reported today may be from premed steroids for taxotere, begun as directed 02-17-15.   PAC in Flu vaccine done 02-12-15  She is to meet with dietician today. Information on upcoming [employer] given in case this would be helpful for grandchildren  ONCOLOGIC HISTORY Patient presented with gradually enlarging mass at right vulva for ~ 8 months, uncomfortable with direct pressure and ulcerated with occasional bleeding. She was seen in ED on 02-02-14 with right inguinal adenopathy and right vulvar masses, by Dr Elonda Husky on 02-03-14,  then by Dr Skeet Latch on 02-05-14, with biopsy of both vulvar mass and satellite lesions on inner thigh and mons. Exam found 7 cm right vulvar mass with 4 cm extension to distal vagina, extending anteriorly to urethral meatus and in ischiorectal area, with satellite lesions inner thigh and right mons. Pathology [id number]) showed invasive squamous cell carcinoma arising in background of high grade squamous intraepithelial lesion from vulva biopsy and invasive moderately differentiated squamous cell carcinoma from inner thigh lesion. CXR 02-06-14 showed left lower lobe lung mass 3.6 x 3.6 x 3.6 cm, with no adenopathy and no bony lesions, emphysematous changes. CT chest 02-09-14 showed the lower lobe mass 3.7 x 3.5 x 3.3 cm as well as 3 mm RLL nodule and faint 4 mm LUL nodule, no mediastinal or hilar nodes, no axillary or supraclavicular nodes, and upper abdomen not remarkable. PET 02-18-14 measured LLL lung mass 4.6 x 3.9 cm, hypermetabolic, and noted multiple subcentimeter nodules scattered thruout lungs bilaterally; the right vulvar mass was also hypermetabolic, extending up into right side of lower vagina, extensive R>L and right external iliac adenopathy, no uptake in skeleton. 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 She had 3 stereotactic radiation treatments to the LLL pulmonary mass 54 Gy in 3 fractions 10-16, 10-19 and 03-04-14. She received IMRT to vulvar area from 03-09-14, completed 04-23-14, with weekly CDDP x7 03-09-14 thru 04-20-14. Radiation to vulva, vaginal, and pelvic/inguinal nodes was 60 Gy in 30 fractions to gross disease, 54 Gy in 30 fractions to high risk areas, 50.4Gy in 30 fractions  to intermediate risk areas. She had vulvar, vaginal, and nodal boost of 6 Gy in 3 fractions to gross disease. Follow up CT chest 05-04-14 had cavitary, spiculated LLL mass 3.3 x 2.3 cm, having been 4.6 x 3.9 cm prior to IMRT, and no change in scattered  small bilateral pulmonary nodules, however PET 05-27-14 had progression in bilateral pulmonary nodules and partial response in local vulvar involvement. 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 CT CAP 08-10-14 had majority of lung nodules stable with a few slightly increased, 2 mm hepatic lucencies possibly cysts, no adenopathy AP. She had first nivolumab 09-14-14, dose reduced by 50% due to concerns for skin toxicity, treated x 7 cycles thru 01-22-15; progression documented in bilateral lung mets at that point. She had extensive RLE DVT 02-05-15, begun on lovenox. First taxotere given 02-18-15 (ramucirumab not used with the anticoagulation).     Review of systems as above, also: No fever or symptoms of infection. No LLE swelling. No N/V. No problems with PAC.Voiding ok. Minimal upper respiratory congestion. Remainder of 10 point Review of Systems negative.  Objective:  Vital signs in last 24 hours:  BP 117/69 mmHg  Pulse 102  Temp(Src) 97.5 F (36.4 C) (Oral)  Resp 18  Ht _0  (1.549 m)  Wt 111 lb 14.4 oz (50.758 kg)  BMI 21.15 kg/m2  SpO2 100% Weight down 2 more lbs from 02-12-15 Alert, oriented and appropriate. Ambulatory with straight cane.  Alopecia  HEENT:PERRL, sclerae not icteric. Oral mucosa moist, superficial area of erythema 0.5 cm  at gum left upper premolar area, posterior pharynx clear. TMs dull bilaterally, no cerumen, no erythema. Neck supple. No JVD.  Lymphatics:no cervical,supraclavicular, axillary or inguinal adenopathy Resp: diminished BS thruout without wheezes or rales, no dullness to percussion, no use of accessory muscles. Looks less dyspneic with activity in exam room now Cardio: regular rate and rhythm. No gallop. GI: abdomen scaphoid, soft, nontender, not distended, no mass or organomegaly. Normally active bowel sounds.  Musculoskeletal/ Extremities: 2+ rather tight swelling RLE tho more easily able to bend  hip and knee now, without cords, tenderness Neuro: no peripheral neuropathy. Otherwise nonfocal Skin without rash, ecchymosis, petechiae. Dry thickened texture thruout not quite as marked. 1 x 1.5 cm superficial ulceration right lower mons may be area of recurrence.   Portacath-without erythema or tenderness  Lab Results:  Results for orders placed or performed in visit on 02/18/15  CBC with Differential  Result Value Ref Range   WBC 10.4 (H) 3.9 - 10.3 10e3/uL   NEUT# 9.4 (H) 1.5 - 6.5 10e3/uL   HGB 11.0 (L) 11.6 - 15.9 g/dL   HCT 34.1 (L) 34.8 - 46.6 %   Platelets 439 (H) 145 - 400 10e3/uL   MCV 85.6 79.5 - 101.0 fL   MCH 27.5 25.1 - 34.0 pg   MCHC 32.1 31.5 - 36.0 g/dL   RBC 3.98 3.70 - 5.45 10e6/uL   RDW 16.1 (H) 11.2 - 14.5 %   lymph# 0.6 (L) 0.9 - 3.3 10e3/uL   MONO# 0.3 0.1 - 0.9 10e3/uL   Eosinophils Absolute 0.0 0.0 - 0.5 10e3/uL   Basophils Absolute 0.0 0.0 - 0.1 10e3/uL   NEUT% 90.4 (H) 38.4 - 76.8 %   LYMPH% 6.0 (L) 14.0 - 49.7 %   MONO% 3.4 0.0 - 14.0 %   EOS% 0.0 0.0 - 7.0 %   BASO% 0.2 0.0 - 2.0 %  Comprehensive metabolic  panel (Cmet) - [facility]  Result Value Ref Range   Sodium 138 136 - 145 mEq/L   Potassium 3.9 3.5 - 5.1 mEq/L   Chloride 104 98 - 109 mEq/L   CO2 30 (H) 22 - 29 mEq/L   Glucose 146 (H) 70 - 140 mg/dl   BUN 14.9 7.0 - 26.0 mg/dL   Creatinine 0.8 0.6 - 1.1 mg/dL   Total Bilirubin <0.30 0.20 - 1.20 mg/dL   Alkaline Phosphatase 130 40 - 150 U/L   AST 15 5 - 34 U/L   ALT 16 0 - 55 U/L   Total Protein 7.3 6.4 - 8.3 g/dL   Albumin 3.2 (L) 3.5 - 5.0 g/dL   Calcium 10.1 8.4 - 10.4 mg/dL   Anion Gap 5 3 - 11 mEq/L   EGFR 74 (L) >90 ml/min/1.73 m2     Studies/Results:  No results found.  Medications: I have reviewed the patient's current medications. Add claritin daily for TM symptoms. Laxatives as noted. Reviewed decadron 8 mg bid with food around taxotere x 3 days. Discussed neulasta, necessary with taxotere. Biotene and salt water mouth rinses  regularly. Continue lovenox  DISCUSSION: interval history and medications as above. She wants to proceed with taxotere and appears stable for this now, tho obviously in a difficult situation with extensive metastatic disease in lungs. I will see her back with lab 10-17 and she knows to call prior to questions or concerns. Verbal consent obtained.  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 Improved local disease with RT/ sensitizing CDDP completed 04-23-14, and additional taxol CDDP thru 07-27-14. May have some progression left perineum now, follow.  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 Post SBRT to dominant LLL mass. Progressive, extensive pulmonary mets despite nivolumab. Cannot use ramucirumab as planned due to DVT and anticoagulation. Will try docetaxel in attempt to improve/ control disease, tho unless she gets prompt response she likely will not be able to continue aggressive treatment much longer. She would be appropriate for Hospice when chemo is no longer used. Prn albuterol inhaler 3.RLE DVT extensive, found with venous doppler 02-05-15. Continue lovenox indefinitely, or unless bleeding, significant thrombocytopenia, or terminal stages of metastatic cancer. 4.History of severe dermatitis and eczema since childhood, with severe flares during initial nivolumab treatment. Dermatology available if needed, their help appreciated. 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 With other skin  texture changes on nivolumab, possibly this thickening is related to radiation + nivolumab. Similar situation right groin, where discomfort seems local as opposed to referred from hip 9. Advance Directives done. Patient understands that treatment is not expected to be curative.  10.increased GERD controlled with protonix daily  11.anemia related to chemo and pelvic RT: improved, continue oral iron. Thrombocytopenia resolved. 12constipation from nivolumab improved with bowel regimen now 13. flu vaccine given 02-12-15 14.erythema at gum not tender: biotene and salt water rinses 15.ear complaints: not infected, may be Eustachian tube related, add daily claritin 16.progressive weight loss: [facility] dietician to see today  All questions  answered. I will see her back 10-17 with lab. Chemo orders confirmed. TIme spent 30 min including >50% counseling and coordination of care. CC PCP    Anet Logsdon P, MD   02/18/2015, 11:59 AM

## 2015-02-18 NOTE — Progress Notes (Signed)
Nutrition follow-up completed with patient during chemotherapy. Weight decreased and documented as 111.9 pounds October 6, down from 114.2 pounds September 28. Patient states today is the first day she has felt well. Pain in her groin area is gone. She continues to tolerate small amounts of Carnation breakfast essentials and 1 oral nutrition supplement daily.  Nutrition diagnosis: Unintended weight loss continues.  Intervention:  Stressed importance of patient utilizing this time when she feels well to increase calories and protein in small amounts throughout the day. Patient should continue oral nutrition supplements as tolerated. Teach back method used.  Monitoring, evaluation, goals: Patient will work to increase calories and protein to minimize further weight loss.  Next visit: Thursday, October 27, during chemotherapy.  **Disclaimer: This note was dictated with voice recognition software. Similar sounding words can inadvertently be transcribed and this note may contain transcription errors which may not have been corrected upon publication of note.**

## 2015-02-19 ENCOUNTER — Other Ambulatory Visit: Payer: Self-pay | Admitting: Oncology

## 2015-02-19 ENCOUNTER — Telehealth: Payer: Self-pay

## 2015-02-19 NOTE — Telephone Encounter (Signed)
-----   Message from Cora Collum, RN sent at 02/18/2015 12:31 PM EDT ----- Regarding: Dr. Marko Plume chemo follow up call.  1st time Taxotere........has had other chemo before

## 2015-02-19 NOTE — Telephone Encounter (Signed)
[name] is doing fine after treatment. No N/V. She is a little achy in her lower legs.  She is going to go soak in the tub in a little bit.   No skin reactions noted at this point.  Her bowels have not moved yet for today but it usually occurs at night.  Ms. Schissler appreciated the call and knows to call [phone] if she has any issues or concerns.

## 2015-02-20 ENCOUNTER — Ambulatory Visit (HOSPITAL_BASED_OUTPATIENT_CLINIC_OR_DEPARTMENT_OTHER): Payer: 59

## 2015-02-20 VITALS — BP 122/71 | HR 78 | Temp 97.5°F | Resp 18

## 2015-02-20 DIAGNOSIS — C7802 Secondary malignant neoplasm of left lung: Principal | ICD-10-CM

## 2015-02-20 DIAGNOSIS — C7801 Secondary malignant neoplasm of right lung: Secondary | ICD-10-CM

## 2015-02-20 DIAGNOSIS — C3492 Malignant neoplasm of unspecified part of left bronchus or lung: Secondary | ICD-10-CM | POA: Diagnosis not present

## 2015-02-20 DIAGNOSIS — Z5189 Encounter for other specified aftercare: Secondary | ICD-10-CM | POA: Diagnosis not present

## 2015-02-20 DIAGNOSIS — C519 Malignant neoplasm of vulva, unspecified: Secondary | ICD-10-CM

## 2015-02-20 MED ORDER — PEGFILGRASTIM INJECTION 6 MG/0.6ML ~~LOC~~
6.0000 mg | PREFILLED_SYRINGE | Freq: Once | SUBCUTANEOUS | Status: AC
  Administered 2015-02-20: 6 mg via SUBCUTANEOUS

## 2015-02-20 NOTE — Patient Instructions (Signed)
Pegfilgrastim injection What is this medicine? PEGFILGRASTIM (PEG fil gra stim) is a long-acting granulocyte colony-stimulating factor that stimulates the growth of neutrophils, a type of white blood cell important in the body's fight against infection. It is used to reduce the incidence of fever and infection in patients with certain types of cancer who are receiving chemotherapy that affects the bone marrow, and to increase survival after being exposed to high doses of radiation. This medicine may be used for other purposes; ask your health care provider or pharmacist if you have questions. What should I tell my health care provider before I take this medicine? They need to know if you have any of these conditions: -kidney disease -latex allergy -ongoing radiation therapy -sickle cell disease -skin reactions to acrylic adhesives (On-Body Injector only) -an unusual or allergic reaction to pegfilgrastim, filgrastim, other medicines, foods, dyes, or preservatives -pregnant or trying to get pregnant -breast-feeding How should I use this medicine? This medicine is for injection under the skin. If you get this medicine at home, you will be taught how to prepare and give the pre-filled syringe or how to use the On-body Injector. Refer to the patient Instructions for Use for detailed instructions. Use exactly as directed. Take your medicine at regular intervals. Do not take your medicine more often than directed. It is important that you put your used needles and syringes in a special sharps container. Do not put them in a trash can. If you do not have a sharps container, call your pharmacist or healthcare provider to get one. Talk to your pediatrician regarding the use of this medicine in children. While this drug may be prescribed for selected conditions, precautions do apply. Overdosage: If you think you have taken too much of this medicine contact a poison control center or emergency room at  once. NOTE: This medicine is only for you. Do not share this medicine with others. What if I miss a dose? It is important not to miss your dose. Call your doctor or health care professional if you miss your dose. If you miss a dose due to an On-body Injector failure or leakage, a new dose should be administered as soon as possible using a single prefilled syringe for manual use. What may interact with this medicine? Interactions have not been studied. Give your health care provider a list of all the medicines, herbs, non-prescription drugs, or dietary supplements you use. Also tell them if you smoke, drink alcohol, or use illegal drugs. Some items may interact with your medicine. This list may not describe all possible interactions. Give your health care provider a list of all the medicines, herbs, non-prescription drugs, or dietary supplements you use. Also tell them if you smoke, drink alcohol, or use illegal drugs. Some items may interact with your medicine. What should I watch for while using this medicine? You may need blood work done while you are taking this medicine. If you are going to need a MRI, CT scan, or other procedure, tell your doctor that you are using this medicine (On-Body Injector only). What side effects may I notice from receiving this medicine? Side effects that you should report to your doctor or health care professional as soon as possible: -allergic reactions like skin rash, itching or hives, swelling of the face, lips, or tongue -dizziness -fever -pain, redness, or irritation at site where injected -pinpoint red spots on the skin -red or dark-brown urine -shortness of breath or breathing problems -stomach or side pain, or pain   at the shoulder -swelling -tiredness -trouble passing urine or change in the amount of urine Side effects that usually do not require medical attention (report to your doctor or health care professional if they continue or are  bothersome): -bone pain -muscle pain This list may not describe all possible side effects. Call your doctor for medical advice about side effects. You may report side effects to FDA at 1-800-FDA-1088. Where should I keep my medicine? Keep out of the reach of children. Store pre-filled syringes in a refrigerator between 2 and 8 degrees C (36 and 46 degrees F). Do not freeze. Keep in carton to protect from light. Throw away this medicine if it is left out of the refrigerator for more than 48 hours. Throw away any unused medicine after the expiration date. NOTE: This sheet is a summary. It may not cover all possible information. If you have questions about this medicine, talk to your doctor, pharmacist, or health care provider.    2016, [employer] Standard. (2014-05-21 14:30:14)  

## 2015-02-21 DIAGNOSIS — I82401 Acute embolism and thrombosis of unspecified deep veins of right lower extremity: Secondary | ICD-10-CM | POA: Insufficient documentation

## 2015-02-21 DIAGNOSIS — R634 Abnormal weight loss: Secondary | ICD-10-CM | POA: Insufficient documentation

## 2015-02-24 ENCOUNTER — Other Ambulatory Visit: Payer: Self-pay | Admitting: Oncology

## 2015-02-26 ENCOUNTER — Ambulatory Visit: Payer: 59 | Admitting: Radiation Oncology

## 2015-03-01 ENCOUNTER — Encounter: Payer: Self-pay | Admitting: Oncology

## 2015-03-01 ENCOUNTER — Telehealth: Payer: Self-pay | Admitting: Oncology

## 2015-03-01 ENCOUNTER — Other Ambulatory Visit (HOSPITAL_COMMUNITY)
Admission: RE | Admit: 2015-03-01 | Discharge: 2015-03-01 | Disposition: A | Discharge status: Home / Self Care | Payer: 59 | Source: Ambulatory Visit | Attending: Gynecologic Oncology | Admitting: Gynecologic Oncology

## 2015-03-01 ENCOUNTER — Other Ambulatory Visit (HOSPITAL_BASED_OUTPATIENT_CLINIC_OR_DEPARTMENT_OTHER): Payer: 59

## 2015-03-01 ENCOUNTER — Ambulatory Visit (HOSPITAL_BASED_OUTPATIENT_CLINIC_OR_DEPARTMENT_OTHER): Payer: 59 | Admitting: Oncology

## 2015-03-01 VITALS — BP 95/60 | HR 118 | Temp 98.0°F | Resp 18 | Ht 61.0 in | Wt 107.9 lb

## 2015-03-01 DIAGNOSIS — C7801 Secondary malignant neoplasm of right lung: Secondary | ICD-10-CM | POA: Diagnosis not present

## 2015-03-01 DIAGNOSIS — I82401 Acute embolism and thrombosis of unspecified deep veins of right lower extremity: Secondary | ICD-10-CM

## 2015-03-01 DIAGNOSIS — C799 Secondary malignant neoplasm of unspecified site: Secondary | ICD-10-CM | POA: Diagnosis not present

## 2015-03-01 DIAGNOSIS — C541 Malignant neoplasm of endometrium: Secondary | ICD-10-CM

## 2015-03-01 DIAGNOSIS — C519 Malignant neoplasm of vulva, unspecified: Secondary | ICD-10-CM

## 2015-03-01 DIAGNOSIS — C792 Secondary malignant neoplasm of skin: Secondary | ICD-10-CM | POA: Diagnosis not present

## 2015-03-01 DIAGNOSIS — IMO0002 Reserved for concepts with insufficient information to code with codable children: Secondary | ICD-10-CM

## 2015-03-01 DIAGNOSIS — Z7901 Long term (current) use of anticoagulants: Secondary | ICD-10-CM

## 2015-03-01 DIAGNOSIS — C778 Secondary and unspecified malignant neoplasm of lymph nodes of multiple regions: Secondary | ICD-10-CM

## 2015-03-01 DIAGNOSIS — C7802 Secondary malignant neoplasm of left lung: Secondary | ICD-10-CM | POA: Diagnosis not present

## 2015-03-01 DIAGNOSIS — C3492 Malignant neoplasm of unspecified part of left bronchus or lung: Secondary | ICD-10-CM

## 2015-03-01 DIAGNOSIS — C3432 Malignant neoplasm of lower lobe, left bronchus or lung: Secondary | ICD-10-CM

## 2015-03-01 DIAGNOSIS — C3431 Malignant neoplasm of lower lobe, right bronchus or lung: Secondary | ICD-10-CM

## 2015-03-01 DIAGNOSIS — Z95828 Presence of other vascular implants and grafts: Secondary | ICD-10-CM

## 2015-03-01 DIAGNOSIS — R634 Abnormal weight loss: Secondary | ICD-10-CM

## 2015-03-01 LAB — CBC WITH DIFFERENTIAL/PLATELET
BASO%: 1.1 % (ref 0.0–2.0)
BASOS ABS: 0.2 10*3/uL — AB (ref 0.0–0.1)
EOS ABS: 0 10*3/uL (ref 0.0–0.5)
EOS%: 0.1 % (ref 0.0–7.0)
HCT: 36 % (ref 34.8–46.6)
HEMOGLOBIN: 11.6 g/dL (ref 11.6–15.9)
LYMPH%: 5.1 % — AB (ref 14.0–49.7)
MCH: 27.4 pg (ref 25.1–34.0)
MCHC: 32.1 g/dL (ref 31.5–36.0)
MCV: 85.4 fL (ref 79.5–101.0)
MONO#: 0.4 10*3/uL (ref 0.1–0.9)
MONO%: 2.6 % (ref 0.0–14.0)
NEUT#: 13 10*3/uL — ABNORMAL HIGH (ref 1.5–6.5)
NEUT%: 91.1 % — ABNORMAL HIGH (ref 38.4–76.8)
Platelets: 272 10*3/uL (ref 145–400)
RBC: 4.21 10*6/uL (ref 3.70–5.45)
RDW: 16.5 % — AB (ref 11.2–14.5)
WBC: 14.3 10*3/uL — ABNORMAL HIGH (ref 3.9–10.3)
lymph#: 0.7 10*3/uL — ABNORMAL LOW (ref 0.9–3.3)

## 2015-03-01 LAB — COMPREHENSIVE METABOLIC PANEL (CC13)
ALT: 32 U/L (ref 0–55)
AST: 28 U/L (ref 5–34)
Albumin: 3.2 g/dL — ABNORMAL LOW (ref 3.5–5.0)
Alkaline Phosphatase: 211 U/L — ABNORMAL HIGH (ref 40–150)
Anion Gap: 12 mEq/L — ABNORMAL HIGH (ref 3–11)
BUN: 11.9 mg/dL (ref 7.0–26.0)
CHLORIDE: 104 meq/L (ref 98–109)
CO2: 23 meq/L (ref 22–29)
Calcium: 9.9 mg/dL (ref 8.4–10.4)
Creatinine: 1.1 mg/dL (ref 0.6–1.1)
EGFR: 51 mL/min/{1.73_m2} — ABNORMAL LOW (ref >90)
GLUCOSE: 138 mg/dL (ref 70–140)
POTASSIUM: 3.8 meq/L (ref 3.5–5.1)
SODIUM: 139 meq/L (ref 136–145)
Total Bilirubin: <0.3 mg/dL (ref 0.20–1.20)
Total Protein: 6.7 g/dL (ref 6.4–8.3)

## 2015-03-01 MED ORDER — LIDOCAINE VISCOUS 2 % MT SOLN
OROMUCOSAL | Status: DC
Start: 2015-03-01 — End: 2015-03-08

## 2015-03-01 NOTE — Telephone Encounter (Signed)
Appointments made and avs printed for patient °

## 2015-03-01 NOTE — Progress Notes (Signed)
Vulvar lesions assessed per Dr. Mariana Kaufman request.  Patient stating the firmness of the left labia had just developed recently and was not present at her visit with Dr. Isidore Moos.  Mild firmness of the lower portion of the right labia majora had been present since radiation per pt.  Using peri-bottle at home and cleansing the area frequently.  Discomfort reported with the area intermittently.  Left labia firm spanning 4 cm vertically.  3 cm ulceration on the inner aspect of the left labia with firmness beginning at the borders.  1 cm ulceration also noted on the right vulva.  Situation discussed with Dr. Denman George.  Plan to proceed with biopsy today to rule out recurrence with follow up appt this Friday with Dr. Denman George.  Cindi Carbon, RN as chaperone.  Patient consented to the vulvar biopsy procedure.  Denied allergies to lidocaine and betadine.  Left labia cleansed with betadine.  1cc 2 % lidocaine injected in the site.  3 mm punch biopsy performed at the midline of the left labia.  Silver nitrate applied.  Patient tolerated the procedure well.

## 2015-03-01 NOTE — Progress Notes (Signed)
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 She had first taxotere in palliative attempt on 02-18-15, with neulasta on 02-20-15.  She continues lovenox for RLE DVT.  Patient tolerated the taxotere reasonably well, without nausea and no marked aching from neulasta. She has "no energy" but did bring herself to appointment today. Taste is bad since chemo, still trying to eat and to drink, has had only tea so far this AM. Bowels are moving with formed stool daily. She has ulcerated areas at inner labia which are new. She is doing sitz baths, using SSD cream and has used some viscous lidocaine "when pain is too bad". She denies bleeding. No increased swelling or increased pain RLE or groin. She is not SOB at rest, no cough or sputum, no chest pain.    PAC in Flu vaccine done 02-12-15   ONCOLOGIC HISTORY Patient presented with gradually enlarging mass at right vulva for ~ 8 months, uncomfortable with direct pressure and ulcerated with occasional bleeding. She was seen in ED on 02-02-14 with right inguinal adenopathy and right vulvar masses, by Dr Elonda Husky on 02-03-14, then by Dr Skeet Latch on 02-05-14, with biopsy of both vulvar mass and satellite lesions on inner thigh and mons. Exam found 7 cm right vulvar mass with 4 cm extension to distal vagina, extending anteriorly to urethral meatus and in ischiorectal area, with satellite lesions inner thigh and right mons. Pathology [phone]) showed invasive squamous cell carcinoma arising in background of high grade squamous intraepithelial lesion from vulva biopsy and invasive moderately differentiated squamous cell carcinoma from inner thigh lesion. CXR 02-06-14 showed  left lower lobe lung mass 3.6 x 3.6 x 3.6 cm, with no adenopathy and no bony lesions, emphysematous changes. CT chest 02-09-14 showed the lower lobe mass 3.7 x 3.5 x 3.3 cm as well as 3 mm RLL nodule and faint 4 mm LUL nodule, no mediastinal or hilar nodes, no axillary or supraclavicular nodes, and upper abdomen not remarkable. PET 02-18-14 measured LLL lung mass 4.6 x 3.9 cm, hypermetabolic, and noted multiple subcentimeter nodules scattered thruout lungs bilaterally; the right vulvar mass was also hypermetabolic, extending up into right side of lower vagina, extensive R>L and right external iliac adenopathy, no uptake in skeleton. 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 She had 3 stereotactic radiation treatments to the LLL pulmonary mass 54 Gy in 3 fractions 10-16, 10-19 and 03-04-14. She received IMRT to vulvar area from 03-09-14, completed 04-23-14, with weekly CDDP x7 03-09-14 thru 04-20-14. Radiation to vulva, vaginal, and pelvic/inguinal nodes was 60 Gy in 30 fractions to gross disease, 54 Gy in 30 fractions to high risk areas, 50.4Gy in 30 fractions to intermediate risk areas. She had vulvar, vaginal, and nodal boost of 6 Gy in 3 fractions to gross disease. Follow up CT chest 05-04-14 had cavitary, spiculated LLL mass 3.3 x 2.3 cm, having been 4.6 x 3.9 cm prior to IMRT, and no change in scattered small bilateral pulmonary nodules, however PET 05-27-14 had progression in bilateral pulmonary nodules and partial response in local vulvar involvement. 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 CT CAP 08-10-14 had majority of lung  nodules stable with a few slightly increased, 2 mm hepatic lucencies possibly cysts, no adenopathy AP. She had first nivolumab 09-14-14, dose reduced by 50% due to concerns for skin toxicity, treated x 7 cycles thru 01-22-15; progression documented in bilateral lung mets  at that point. She had extensive RLE DVT 02-05-15, begun on lovenox. First taxotere given 02-18-15 (ramucirumab not used with the anticoagulation).    Review of systems as above, also: Able to sleep with hs medications. No increase peripheral neuropathy.. No problems with PAC Remainder of 10 point Review of Systems negative.  Objective:  Vital signs in last 24 hours:  BP 95/60 mmHg  Pulse 118  Temp(Src) 98 F (36.7 C) (Oral)  Resp 18  Ht 5' 1" (1.549 m)  Wt 107 lb 14.4 oz (48.943 kg)  BMI 20.40 kg/m2  SpO2 96% Weight is down another 4 lbs. Alert, oriented and appropriate. Ambulatory without assistance. Looks a little SOB with exertion tho O2 sat noted. Alopecia  HEENT:PERRL, sclerae not icteric. Oral mucosa a little dry without thrush or lesions, posterior pharynx clear.  Neck supple. No JVD.  Lymphatics:no cervical,supraclavicular adenopathy Resp: diminished BS thruout without wheezes or rales, no dullness to percussion bilaterally Cardio: regular rate and rhythm. No gallop. GI: soft, nontender, not distended, no mass or organomegaly. A few bowel sounds.  Perineum with 1 x 1.5 cm scaling lesion right mons not tender, confluent ulcerations inner labia. Musculoskeletal/ Extremities: Induration thru right groin and upper thigh. 1+ edema right lower leg without clear cord or tenderness. No swelling LLE Neuro: no change peripheral neuropathy. Otherwise nonfocal. Psych appropriate mood and affect Skin dry thruout without rash, ecchymosis, petechiae Portacath-without erythema or tenderness  Lab Results:  Results for orders placed or performed in visit on 03/01/15  CBC with Differential  Result Value Ref Range   WBC 14.3 (H) 3.9 - 10.3 10e3/uL   NEUT# 13.0 (H) 1.5 - 6.5 10e3/uL   HGB 11.6 11.6 - 15.9 g/dL   HCT 36.0 34.8 - 46.6 %   Platelets 272 145 - 400 10e3/uL   MCV 85.4 79.5 - 101.0 fL   MCH 27.4 25.1 - 34.0 pg   MCHC 32.1 31.5 - 36.0 g/dL   RBC 4.21 3.70 - 5.45 10e6/uL    RDW 16.5 (H) 11.2 - 14.5 %   lymph# 0.7 (L) 0.9 - 3.3 10e3/uL   MONO# 0.4 0.1 - 0.9 10e3/uL   Eosinophils Absolute 0.0 0.0 - 0.5 10e3/uL   Basophils Absolute 0.2 (H) 0.0 - 0.1 10e3/uL   NEUT% 91.1 (H) 38.4 - 76.8 %   LYMPH% 5.1 (L) 14.0 - 49.7 %   MONO% 2.6 0.0 - 14.0 %   EOS% 0.1 0.0 - 7.0 %   BASO% 1.1 0.0 - 2.0 %  Comprehensive metabolic panel (Cmet) - [facility]  Result Value Ref Range   Sodium 139 136 - 145 mEq/L   Potassium 3.8 3.5 - 5.1 mEq/L   Chloride 104 98 - 109 mEq/L   CO2 23 22 - 29 mEq/L   Glucose 138 70 - 140 mg/dl   BUN 11.9 7.0 - 26.0 mg/dL   Creatinine 1.1 0.6 - 1.1 mg/dL   Total Bilirubin <0.30 0.20 - 1.20 mg/dL   Alkaline Phosphatase 211 (H) 40 - 150 U/L   AST 28 5 - 34 U/L   ALT 32 0 - 55 U/L   Total Protein 6.7 6.4 - 8.3 g/dL   Albumin 3.2 (L) 3.5 - 5.0 g/dL   Calcium 9.9 8.4 -  10.4 mg/dL   Anion Gap 12 (H) 3 - 11 mEq/L   EGFR 51 (L) >90 ml/min/1.73 m2     Studies/Results:  No results found.  Medications: I have reviewed the patient's current medications. Refill viscous lidocaine, to mix with desitin or SSD lotion to perineum prn   DISCUSSION. Interval history including first treatment of taxotere reviewed. New ulcerations at labia concerning for progression (tho wonder if may be herpetic with lots of steroids used with chemo): gyn onc aware and likely will biopsy. Counts still ok with taxotere. Lack of energy likely multifactorial related to metastatic cancer, poor po intake, chemo. I am concerned that performance status likely will not be adequate to tolerate much more chemo, if any.  Encouraged sitz baths, viscous lidocaine, prn tramadol. Discussed diet choices with taste changes and ongoing weight loss.  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 Improved local disease with RT/ sensitizing CDDP  completed 04-23-14, and additional taxol CDDP thru 07-27-14. Progression in lungs on taxol CDDP, then progression in lungs on nivolumab. First taxotere given 10-6 with neulasta. New ulcerations at labia, gyn onc to biopsy. I will see her again with lab on 10-24. Expect need to transition to palliative care without chemo soon, would be Hospice appropriate when chemo stopped.  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 Post SBRT to dominant LLL mass. Progressive, extensive pulmonary mets despite nivolumab. Cannot use ramucirumab as planned due to DVT and anticoagulation. Will try docetaxel in attempt to improve/ control disease, tho unless she gets prompt response she likely will not be able to continue aggressive treatment much longer. She would be appropriate for Hospice when chemo is no longer used. Prn albuterol inhaler 3.RLE DVT extensive, found with venous doppler 02-05-15. Continue lovenox indefinitely, or unless bleeding, significant thrombocytopenia, or terminal stages of metastatic cancer. 4.History of severe dermatitis and eczema since childhood, with severe flares during initial nivolumab treatment. Dermatology available if needed, their help appreciated. 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 With other skin texture changes on nivolumab, possibly this thickening is related to radiation + nivolumab. Similar situation right groin, where discomfort seems local as opposed to referred from hip 9. Advance Directives done. Patient understands that treatment is not expected to be curative.  10.increased GERD controlled with protonix daily  11.anemia related to chemo and pelvic RT:  improved, continue oral iron. Thrombocytopenia resolved. 12constipation from nivolumab improved with bowel regimen now 13. flu vaccine given 02-12-15 14.protein calorie malnutrition, unintentional weight loss. Appreciate [facility] dietician help, however not improving.   TIme spent 30 min including >50% counseling and coordination of care. She knows to call prior to next scheduled appointment if needed.   LIVESAY,LENNIS P, MD   03/01/2015, 8:52 PM    

## 2015-03-05 ENCOUNTER — Ambulatory Visit: Payer: 59 | Attending: Gynecologic Oncology | Admitting: Gynecologic Oncology

## 2015-03-05 ENCOUNTER — Encounter: Payer: Self-pay | Admitting: Gynecologic Oncology

## 2015-03-05 VITALS — BP 91/67 | HR 109 | Temp 98.1°F | Resp 18 | Ht 61.0 in | Wt 108.9 lb

## 2015-03-05 DIAGNOSIS — C519 Malignant neoplasm of vulva, unspecified: Secondary | ICD-10-CM | POA: Diagnosis not present

## 2015-03-05 DIAGNOSIS — R1030 Lower abdominal pain, unspecified: Secondary | ICD-10-CM | POA: Diagnosis not present

## 2015-03-05 DIAGNOSIS — C7800 Secondary malignant neoplasm of unspecified lung: Secondary | ICD-10-CM | POA: Diagnosis not present

## 2015-03-05 NOTE — Patient Instructions (Signed)
We will contact Dr. Isidore Moos to see if she can radiate the right groin.  Please call for any questions or concerns.

## 2015-03-05 NOTE — Progress Notes (Signed)
Followup Note: Gyn-Onc  CC: Dr. Elonda Husky    CC:  Chief Complaint  Patient presents with  . Vulvar Cancer    Folow up    Assessment/Plan:  Jody Beard  is a 64 y.o.  With a progressive stage IV vulvar ca (SCC) and  second primary lung cancer who demonstrated progression on cis/taxol chemotherapy and more recently PD1 inhibitors. She is now on Docetaxel and is showing progression with new disease on the vulva, right groin.  1/ continue Docetacel as her performance status is 0-1 and she is tolerating this well. Her vulvar/groin progression occurred at the beginning/prior to starting this regimen. 2/ we will not operate on the left sided vulvar mass as therapeutic intent is palliative and this lesion is asymptomatic. Additionally, morbidity would be high as this is a prior site of radiation and wound breakdown would be likely. 3/ Right groin pain - on exam this site is concerning for involvement of the lymphatics of the groin. It is symptomatic. It is fixed to underlying structures (including vascular) and so I do not believe surgery is appropriate to palliate this site. I will touch base with her Radiation Oncologist, Dr Lanell Persons to discuss possible palliative radiation to the groin, though she has already been radiated here and this may not be possible. 4/ Progressive vulvar/lung cancer (new lesions in liver, right groin and left vulva, and progressive pulmonary lesions on CXR) - I discussed with Raymona that her disease is not curable and is progressive. She is DNR/DNI and has this discussed this with her family. I encouraged her to contemplate discontinuing chemotherapy if the toxicity of therapy ultrasound quality of life as response is unlikely.  I recommend that she have family consider being evaluated by hospice and palliative care if she fails to respond to the Taxotere. I explained that I believe that her life expectancy is less than 6 months based on the progressive nature of her disease.     HPI:   Jody Beard is a 64 year old woman with a history of noticing a right vulvar mass that had been progressing since approximately fabric 2015. In September 2015 she was seen by Dr. Elonda Husky who performed a biopsy which confirmed squamous cell carcinoma of the vulva. The disease was extremely extensive on the vulva, with a 7cm vulvar lesion that extended additionally into the 4cmof the distal vagina,  the vaginal submucosa and the urethra.  Satellite lesions appreciated on the inner thigh and mons.  Bilateral inguinal adenopathy noted.  During preoperative evaluation for a planned inguinal lymphadenectomy, and chest x-ray revealed a large 3 cm solid lung mass. A PET/CT was performed and was demonstrated extensive uptake in an enlarged inguinal and pelvic lymph nodes, as well as a left lower lobe lung mass that was avid and multiple bilateral pulmonary nodules. The chest mass was biopsied and confirmed to be squamous cell carcinoma. It was unclear if it was a second primary lung cancer, or a metastasis from her vulva.   Treatment planning determined that she would receive primary chemoradiation to control the disease at the vulva, inguinal and pelvic nodes, with cisplatin radiosensitizing chemotherapy. Her radiation treatment dates were between 03/09/2014 and 04/23/2014. She received 60 gray to the vulvovaginal and pelvic inguinal nodes, with a vulvovaginal and nodal boost using IMRT. She received concurrent radiosensitizing cisplatin chemotherapy.  She tolerated the radiation treatment relatively well with excellent clinical tumor regression on physical examination.  On 05/27/14 she received a PET scan which revealed good response in  the pelvis, vulvar and left lower lobe, however there was interval increase in number and size of the numerous bilateral pulmonary nodules suggesting a mixed response to initial therapy.   Biopsy of the vulva (post-treatment) in January, 2016 showed no microscopic disease (a  complete pathologic response at the vulva).  She then received 5 additional cycles of cddp and paclitaxel completed in March, 2016. CT of the chest abdomen and pelvis on 08/10/14 showed stable disease in most chest nodules, with slight increase in size in 2 nodules and a stable dominant lesion. She had no new nodal disease or abdo/pelvis disease.  However, she had persistent pulmonary disease/progressive, and therefore was treated with Nivolumab but at lower doses initially due to dermato-toxicity.  She has been managing severe dermatitis (whole body) since beginning therapy and is seeing a dermatologist who has prescribed steroids for different regions of the body and vegetable oil as an emolient.   She was evaluated by me in July, 2016 and was NED from the standpoint of her vulva disease. However, she had persistent and increasing/mixed response in her lung sites. She was continued on full strength Nivolumab.    INTERVAL HX: In August, 2016 a CT of the abdo/pelvis showed new liver lesions. A followup chest XRay in September, 2016 showed progression of her pulmonary lesions. She was changed to Docetaxel chemotherapy in September/October 2016. At that time she reported a new left vulva lesion. This was biopsied by Joylene John NP which showed recurrent SCC.  The patient reports for 2 months she has had progressive right groin pain at the groin crease at the site of her pior blood clot. She reports that this is the only pain/symptom that concerns her at present.  Review of Systems:  Constitutional  Feels fatiged with poor appetite. Cardiovascular  No chest pain, shortness of breath, or edema  Pulmonary  No cough or wheeze.  Gastro Intestinal  No nausea, vomitting, or diarrhoea. No bright red blood per rectum, no abdominal pain, change in bowel movement, or constipation.  Genito Urinary  No frequency, urgency, dysuria,.  Mild left vulvar discomfort, no vaginal bleeding Musculo Skeletal  +  right groin pain Neurologic  No weakness, numbness, change in gait,  Psychology  No depression.  Patient reports anxiety about the diagnosis Skin Widespread itchy rash (see HPI)  Current Meds:  Outpatient Encounter Prescriptions as of 03/05/2015  Medication Sig  . acetaminophen (TYLENOL) 325 MG tablet Take 325 mg by mouth at bedtime as needed.  Marland Kitchen albuterol (PROVENTIL HFA;VENTOLIN HFA) 108 (90 BASE) MCG/ACT inhaler Inhale 1-2 puffs into the lungs every 6 (six) hours as needed for wheezing or shortness of breath.  . enoxaparin (LOVENOX) 80 MG/0.8ML injection Inject 0.8 mLs (80 mg total) into the skin daily.  Marland Kitchen lidocaine (XYLOCAINE) 2 % solution Mix as directed and apply 3-4 times daily to affected areas.  . lidocaine-prilocaine (EMLA) cream Apply to Porta-Cath site 1-2 hours prior to access as directed.  . Nutritional Supplements (CARNATION BREAKFAST ESSENTIALS) PACK Take 0.5 packets by mouth daily with breakfast. Taking 3-4 times a week  . phenylephrine (SUDAFED PE) 10 MG TABS tablet Take 10 mg by mouth at bedtime as needed.  . polyethylene glycol (MIRALAX / GLYCOLAX) packet Take 17 g by mouth 2 (two) times daily.  . silver sulfADIAZINE (SILVADENE) 1 % cream Apply 1 application topically daily.  . traMADol (ULTRAM) 50 MG tablet Take 1-2 tablets (50-100 mg total) by mouth every 8 (eight) hours as needed.  . cyclobenzaprine (  FLEXERIL) 10 MG tablet 1/2 to 1 tablet every 8-12 hours as needed for muscle spasm. (Patient not taking: Reported on 12/31/2014)  . dexamethasone (DECADRON) 4 MG tablet Take 2 tabs=8 mg  with food twice a day for 3 days. Begin the day prior to Taxotere treatment. (Patient not taking: Reported on 03/01/2015)  . diphenhydrAMINE (BENADRYL) 25 mg capsule Take 25 mg by mouth every 6 (six) hours as needed for allergies or sleep.   Marland Kitchen FERRETTS 325 (106 FE) MG TABS tablet TAKE 1 TABLET BY MOUTH ON AN EMPTY STOMACH WITH ORANGE JUICE (Patient not taking: Reported on 01/28/2015)  .  hydrOXYzine (VISTARIL) 25 MG capsule Take 25-50 mg by mouth at bedtime as needed for itching.  . Melatonin 10 MG TBDP Take 10 mg by mouth at bedtime.  . mupirocin ointment (BACTROBAN) 2 % Apply 1 application topically 3 (three) times daily.  Marland Kitchen triamcinolone cream (KENALOG) 0.1 % Apply 1 application topically 2 (two) times daily.  . [DISCONTINUED] pantoprazole (PROTONIX) 40 MG tablet Take 1 tablet (40 mg total) by mouth daily. (Patient not taking: Reported on 12/31/2014)   No facility-administered encounter medications on file as of 03/05/2015.    Allergy:  Allergies  Allergen Reactions  . Other Rash    Bleach Cleaner  . Penicillins Rash    Social Hx:   Social History   Social History  . Marital Status: Married    Spouse Name: Fraser Din  . Number of Children: 2  . Years of Education: 12   Occupational History  . Not on file.   Social History Main Topics  . Smoking status: Former Smoker -- 1.00 packs/day for 30 years    Types: Cigarettes    Quit date: 07/13/2013  . Smokeless tobacco: Never Used     Comment: current e-cig  . Alcohol Use: No     Comment: rare  . Drug Use: No  . Sexual Activity: No   Other Topics Concern  . Not on file   Social History Narrative   Patient lives at home with Micheline Rough her husband   Patient has a high school education   Patient has 2 children    Patient is right handed    Patient is on leave of absence from [employer]     Past Surgical Hx:  Past Surgical History  Procedure Laterality Date  . Vulva /perineum biopsy  02/05/14  . Tubal ligation  1980's  . Dilation and curettage of uterus  1980's    Past Medical Hx:  Past Medical History  Diagnosis Date  . Vulvar lesion   . Eczema   . H/O bronchitis   . Headache(784.0)     migraines years ago  . Hx of radiation therapy 03/09/14-04/23/14    vulva/vaginal, pelvic/inguinal nodes 60 Gy 30 fx, vulvar/vaginal nodal boost 6 Gy 3 fx  . History of radiation therapy 10/16, 10/19, 03/04/14     left  LL lung tumor/ 54 Gy/3 fx SBRT  . Vulvar cancer ([place]) 01/2014    future radiation and chemo  . Cancer [facility]) 01/2014    vulvar    Past Gynecological History:  G2P2 last pap > 9 years ago. No LMP recorded. Patient is postmenopausal.  Family Hx:  Family History  Problem Relation Age of Onset  . Heart disease Mother   . Kidney cancer Father   . Prostate cancer Cousin     Vitals:  Blood pressure 91/67, pulse 109, temperature 98.1 F (36.7 C), temperature source Oral, resp.  rate 18, height '5\' 1"'$  (1.549 m), weight 108 lb 14.4 oz (49.397 kg), SpO2 92 %.  Physical Exam: WD in NAD Neck  Supple NROM, without any enlargements.  Lymph Node Survey No cervical or supraclavicular  Adenopathy. No palpable inguinal lymphadenopathy. Skin No rash Cardiovascular  Pulse normal rate, regularity and rhythm. S1 and S2 normal.  Lungs  Clear to auscultation bilaterally, Good air movement.  Skin  Subtle petechiae in groin folds, otherwise no rash  Psychiatry  Alert and oriented appropriate mood affect speech and reasoning. Abdomen  Normoactive bowel sounds, abdomen soft, non-tender. Surgical  sites intact without evidence of hernia.  Back No CVA tenderness Genito Urinary  Vulva/vagina:  There is a 10 cm expansive indurated fixed tissue overlying the right groin with a peau d'orange effect. There is puckering at the groin at the site of a prior incision. There is bluish reddish discoloration of the overlying skin. On the left labia majora is a 4 cm necrotic-appearing ulcerated indurated area of recurrent tumor. It is mobile and not fixed to underlying tissue and not approximating urethral meatus. Extremities  No bilateral cyanosis, clubbing or edema.  30 minutes was spent with the patient with >50% spent with face to face counseling about her recurrence, prognosis and treatment options including end of life care and DNR discussions.  Donaciano Eva, MD 03/05/2015, 5:14 PM

## 2015-03-07 ENCOUNTER — Other Ambulatory Visit: Payer: Self-pay | Admitting: Oncology

## 2015-03-08 ENCOUNTER — Other Ambulatory Visit (HOSPITAL_BASED_OUTPATIENT_CLINIC_OR_DEPARTMENT_OTHER): Payer: 59

## 2015-03-08 ENCOUNTER — Telehealth: Payer: Self-pay | Admitting: Oncology

## 2015-03-08 ENCOUNTER — Telehealth: Payer: Self-pay | Admitting: *Deleted

## 2015-03-08 ENCOUNTER — Other Ambulatory Visit: Payer: Self-pay | Admitting: *Deleted

## 2015-03-08 ENCOUNTER — Telehealth: Payer: Self-pay | Admitting: General Practice

## 2015-03-08 ENCOUNTER — Ambulatory Visit (HOSPITAL_BASED_OUTPATIENT_CLINIC_OR_DEPARTMENT_OTHER): Payer: 59

## 2015-03-08 ENCOUNTER — Ambulatory Visit (HOSPITAL_BASED_OUTPATIENT_CLINIC_OR_DEPARTMENT_OTHER): Payer: 59 | Admitting: Oncology

## 2015-03-08 ENCOUNTER — Encounter: Payer: Self-pay | Admitting: Oncology

## 2015-03-08 VITALS — BP 85/62 | HR 125 | Temp 97.5°F | Resp 19 | Ht 61.0 in | Wt 108.6 lb

## 2015-03-08 DIAGNOSIS — C3492 Malignant neoplasm of unspecified part of left bronchus or lung: Secondary | ICD-10-CM

## 2015-03-08 DIAGNOSIS — C519 Malignant neoplasm of vulva, unspecified: Secondary | ICD-10-CM

## 2015-03-08 DIAGNOSIS — R634 Abnormal weight loss: Secondary | ICD-10-CM

## 2015-03-08 DIAGNOSIS — C3431 Malignant neoplasm of lower lobe, right bronchus or lung: Secondary | ICD-10-CM

## 2015-03-08 DIAGNOSIS — D6481 Anemia due to antineoplastic chemotherapy: Secondary | ICD-10-CM

## 2015-03-08 DIAGNOSIS — K219 Gastro-esophageal reflux disease without esophagitis: Secondary | ICD-10-CM

## 2015-03-08 DIAGNOSIS — C778 Secondary and unspecified malignant neoplasm of lymph nodes of multiple regions: Secondary | ICD-10-CM

## 2015-03-08 DIAGNOSIS — L539 Erythematous condition, unspecified: Secondary | ICD-10-CM

## 2015-03-08 DIAGNOSIS — C799 Secondary malignant neoplasm of unspecified site: Secondary | ICD-10-CM

## 2015-03-08 DIAGNOSIS — C3432 Malignant neoplasm of lower lobe, left bronchus or lung: Secondary | ICD-10-CM

## 2015-03-08 DIAGNOSIS — C541 Malignant neoplasm of endometrium: Secondary | ICD-10-CM

## 2015-03-08 DIAGNOSIS — IMO0002 Reserved for concepts with insufficient information to code with codable children: Secondary | ICD-10-CM

## 2015-03-08 DIAGNOSIS — T451X5A Adverse effect of antineoplastic and immunosuppressive drugs, initial encounter: Secondary | ICD-10-CM

## 2015-03-08 DIAGNOSIS — C792 Secondary malignant neoplasm of skin: Secondary | ICD-10-CM

## 2015-03-08 DIAGNOSIS — I82401 Acute embolism and thrombosis of unspecified deep veins of right lower extremity: Secondary | ICD-10-CM

## 2015-03-08 DIAGNOSIS — Z95828 Presence of other vascular implants and grafts: Secondary | ICD-10-CM

## 2015-03-08 DIAGNOSIS — G893 Neoplasm related pain (acute) (chronic): Secondary | ICD-10-CM

## 2015-03-08 DIAGNOSIS — E860 Dehydration: Secondary | ICD-10-CM

## 2015-03-08 LAB — COMPREHENSIVE METABOLIC PANEL (CC13)
ALT: 25 U/L (ref 0–55)
ANION GAP: 12 meq/L — AB (ref 3–11)
AST: 27 U/L (ref 5–34)
Albumin: 2.9 g/dL — ABNORMAL LOW (ref 3.5–5.0)
Alkaline Phosphatase: 236 U/L — ABNORMAL HIGH (ref 40–150)
BUN: 11.9 mg/dL (ref 7.0–26.0)
CHLORIDE: 104 meq/L (ref 98–109)
CO2: 22 meq/L (ref 22–29)
Calcium: 10.3 mg/dL (ref 8.4–10.4)
Creatinine: 0.9 mg/dL (ref 0.6–1.1)
EGFR: 65 mL/min/{1.73_m2} — ABNORMAL LOW (ref >90)
GLUCOSE: 107 mg/dL (ref 70–140)
Potassium: 4 mEq/L (ref 3.5–5.1)
SODIUM: 138 meq/L (ref 136–145)
TOTAL PROTEIN: 6.6 g/dL (ref 6.4–8.3)

## 2015-03-08 LAB — CBC WITH DIFFERENTIAL/PLATELET
BASO%: 0.5 % (ref 0.0–2.0)
Basophils Absolute: 0.1 10*3/uL (ref 0.0–0.1)
EOS%: 0.3 % (ref 0.0–7.0)
Eosinophils Absolute: 0.1 10*3/uL (ref 0.0–0.5)
HCT: 35.1 % (ref 34.8–46.6)
HGB: 11.1 g/dL — ABNORMAL LOW (ref 11.6–15.9)
LYMPH%: 5.9 % — ABNORMAL LOW (ref 14.0–49.7)
MCH: 27.4 pg (ref 25.1–34.0)
MCHC: 31.6 g/dL (ref 31.5–36.0)
MCV: 86.7 fL (ref 79.5–101.0)
MONO#: 1 10*3/uL — ABNORMAL HIGH (ref 0.1–0.9)
MONO%: 5.9 % (ref 0.0–14.0)
NEUT#: 15 10*3/uL — ABNORMAL HIGH (ref 1.5–6.5)
NEUT%: 87.4 % — ABNORMAL HIGH (ref 38.4–76.8)
Platelets: 365 10*3/uL (ref 145–400)
RBC: 4.05 10*6/uL (ref 3.70–5.45)
RDW: 17.2 % — ABNORMAL HIGH (ref 11.2–14.5)
WBC: 17.1 10*3/uL — ABNORMAL HIGH (ref 3.9–10.3)
lymph#: 1 10*3/uL (ref 0.9–3.3)

## 2015-03-08 MED ORDER — LIDOCAINE VISCOUS 2 % MT SOLN: OROMUCOSAL | Status: AC

## 2015-03-08 MED ORDER — DEXAMETHASONE 4 MG PO TABS: ORAL_TABLET | ORAL | Status: DC

## 2015-03-08 MED ORDER — SODIUM CHLORIDE 0.9 % IV SOLN
Freq: Once | INTRAVENOUS | Status: AC
  Administered 2015-03-08: 11:00:00 via INTRAVENOUS

## 2015-03-08 MED ORDER — TRAMADOL HCL 50 MG PO TABS
ORAL_TABLET | ORAL | Status: AC
  Filled 2015-03-08: qty 1

## 2015-03-08 MED ORDER — HEPARIN SOD (PORK) LOCK FLUSH 100 UNIT/ML IV SOLN
500.0000 [IU] | Freq: Once | INTRAVENOUS | Status: AC
  Administered 2015-03-08: 500 [IU] via INTRAVENOUS
  Filled 2015-03-08: qty 5

## 2015-03-08 MED ORDER — SODIUM CHLORIDE 0.9 % IJ SOLN
10.0000 mL | INTRAMUSCULAR | Status: DC | PRN
  Administered 2015-03-08: 10 mL via INTRAVENOUS
  Filled 2015-03-08: qty 10

## 2015-03-08 MED ORDER — TRAMADOL HCL 50 MG PO TABS
50.0000 mg | ORAL_TABLET | Freq: Once | ORAL | Status: AC
  Administered 2015-03-08: 50 mg via ORAL

## 2015-03-08 MED ORDER — TRAMADOL HCL 50 MG PO TABS: 50.0000 mg | ORAL_TABLET | Freq: Three times a day (TID) | ORAL | Status: DC | PRN

## 2015-03-08 NOTE — Telephone Encounter (Signed)
Appointments made and avs printed for patient °

## 2015-03-08 NOTE — Patient Instructions (Signed)
Take tramadol 1-2 tablets (50-100 mg) 3x daily

## 2015-03-08 NOTE — Progress Notes (Signed)
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 Disease progressed in lungs on initial sensitizing CDDP, then on CDDP taxol, then on nivolumab. She had first taxotere on 02-18-15. Progression at vulva was documented by biopsy 03-01-15.  Dr Denman George saw her on 03-05-15, question if any additional radiation possible in right inguinal area. Patient continues lovenox for RLE DVT.  Other than vulvar and right inguinal pain, she has felt some better overall in past few days. She is using viscous lidocaine with SSD topical, and sitz baths. She has not tried increasing the tramadol dose, but will do that. She is drinking ~ 2 Boost daily with some other fluids, probably not optimal hydration; she is eating more, no nausea or vomiting.  She is slightly lightheaded on standing. She denies increased SOB, any cough or chest pain. She has no bleeding. Areas of the lovenox injections are uncomfortable and RN discussed alternate areas with her, ok to use EMLA to the sites, would not use RLE for lovenox.. Dermatitis is not more of a problem now.   PAC in Flu vaccine done 02-12-15   ONCOLOGIC HISTORY Patient presented with gradually enlarging mass at right vulva for ~ 8 months, uncomfortable with direct pressure and ulcerated with occasional bleeding. She was seen in ED on 02-02-14 with right inguinal adenopathy and right vulvar masses, by Dr Elonda Husky on 02-03-14, then by Dr Skeet Latch on 02-05-14, with biopsy of both vulvar mass and satellite lesions on inner thigh and mons. Exam found 7 cm right vulvar mass with 4 cm extension to distal vagina, extending anteriorly to urethral meatus and in ischiorectal area,  with satellite lesions inner thigh and right mons. Pathology [phone]) showed invasive squamous cell carcinoma arising in background of high grade squamous intraepithelial lesion from vulva biopsy and invasive moderately differentiated squamous cell carcinoma from inner thigh lesion. CXR 02-06-14 showed left lower lobe lung mass 3.6 x 3.6 x 3.6 cm, with no adenopathy and no bony lesions, emphysematous changes. CT chest 02-09-14 showed the lower lobe mass 3.7 x 3.5 x 3.3 cm as well as 3 mm RLL nodule and faint 4 mm LUL nodule, no mediastinal or hilar nodes, no axillary or supraclavicular nodes, and upper abdomen not remarkable. PET 02-18-14 measured LLL lung mass 4.6 x 3.9 cm, hypermetabolic, and noted multiple subcentimeter nodules scattered thruout lungs bilaterally; the right vulvar mass was also hypermetabolic, extending up into right side of lower vagina, extensive R>L and right external iliac adenopathy, no uptake in skeleton. 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 She had 3 stereotactic radiation treatments to the LLL pulmonary mass 54 Gy in 3 fractions 10-16, 10-19 and 03-04-14. She received IMRT to vulvar area from 03-09-14, completed 04-23-14, with weekly CDDP x7 03-09-14 thru 04-20-14. Radiation to vulva, vaginal, and pelvic/inguinal nodes was 60 Gy in 30 fractions to gross disease, 54 Gy in 30 fractions to high risk areas, 50.4Gy in 30 fractions to intermediate risk areas. She had vulvar, vaginal, and nodal boost of 6 Gy in 3 fractions to gross disease. Follow up CT chest 05-04-14 had cavitary, spiculated LLL mass 3.3 x 2.3 cm, having been 4.6 x 3.9 cm prior to IMRT, and  no change in scattered small bilateral pulmonary nodules, however PET 05-27-14 had progression in bilateral pulmonary nodules and partial response in local vulvar involvement. She began CDDP taxol on 06-22-14, with #6 given 07-27-14 (#6 CDDP only due to peripheral  neuropathy). CT CAP 08-10-14 had majority of lung nodules stable with a few slightly increased, 2 mm hepatic lucencies possibly cysts, no adenopathy AP. She had first nivolumab 09-14-14, dose reduced by 50% due to concerns for skin toxicity, treated x 7 cycles thru 01-22-15; progression documented in bilateral lung mets at that point. She had extensive RLE DVT 02-05-15, begun on lovenox. First taxotere given 02-18-15 (ramucirumab not used with the anticoagulation).     Review of systems as above, also: No fever. No problems with PAC. Skin not as dry or uncomfortable as during nivolumab. Remainder of 10 point Review of Systems negative.  Objective:  Vital signs in last 24 hours:  BP 85/62 mmHg  Pulse 125  Temp(Src) 97.5 F (36.4 C) (Oral)  Resp 19  Ht 5' 1"  (1.549 m)  Wt 108 lb 9.6 oz (49.261 kg)  BMI 20.53 kg/m2  SpO2 96% Weight stable Alert, oriented and appropriate. Ambulatory without assistance. Unable to sit easily, respirations not labored. Overall looks no worse today even with lower BP  HEENT:PERRL, sclerae not icteric. Oral mucosa a little dry without lesions, posterior pharynx clear.  Neck supple. No JVD.  Lymphatics:no supraclavicular adenopathy  Induration right groin with probable adenopathy Resp: no wheezes or rales, BS thruout tho diminished, and no dullness to percussion bilaterally Cardio: regular rate and rhythm. No gallop. GI: soft, nontender, not distended, no mass or organomegaly. Normally active bowel sounds.  Musculoskeletal/ Extremities: trace pedal edema without cords, tenderness Neuro: no peripheral neuropathy. Otherwise nonfocal  Skin: texture thruout not as stiff as on nivolumab. Superficial dry ulceration right mons 2 cm and 2 smaller lesions laterally, ulcerations inner labia (area of biopsy). Resolving ecchymoses at left thigh sites of lovenox Breasts: without dominant mass, skin or nipple findings. Axillae benign. Portacath-without erythema or  tenderness  Lab Results:  Results for orders placed or performed in visit on 03/08/15  CBC with Differential  Result Value Ref Range   WBC 17.1 (H) 3.9 - 10.3 10e3/uL   NEUT# 15.0 (H) 1.5 - 6.5 10e3/uL   HGB 11.1 (L) 11.6 - 15.9 g/dL   HCT 35.1 34.8 - 46.6 %   Platelets 365 145 - 400 10e3/uL   MCV 86.7 79.5 - 101.0 fL   MCH 27.4 25.1 - 34.0 pg   MCHC 31.6 31.5 - 36.0 g/dL   RBC 4.05 3.70 - 5.45 10e6/uL   RDW 17.2 (H) 11.2 - 14.5 %   lymph# 1.0 0.9 - 3.3 10e3/uL   MONO# 1.0 (H) 0.1 - 0.9 10e3/uL   Eosinophils Absolute 0.1 0.0 - 0.5 10e3/uL   Basophils Absolute 0.1 0.0 - 0.1 10e3/uL   NEUT% 87.4 (H) 38.4 - 76.8 %   LYMPH% 5.9 (L) 14.0 - 49.7 %   MONO% 5.9 0.0 - 14.0 %   EOS% 0.3 0.0 - 7.0 %   BASO% 0.5 0.0 - 2.0 %  Comprehensive metabolic panel (Cmet) - [facility]  Result Value Ref Range   Sodium 138 136 - 145 mEq/L   Potassium 4.0 3.5 - 5.1 mEq/L   Chloride 104 98 - 109 mEq/L   CO2 22 22 - 29 mEq/L   Glucose 107 70 - 140 mg/dl   BUN 11.9 7.0 - 26.0 mg/dL   Creatinine 0.9 0.6 -  1.1 mg/dL   Total Bilirubin <0.30 0.20 - 1.20 mg/dL   Alkaline Phosphatase 236 (H) 40 - 150 U/L   AST 27 5 - 34 U/L   ALT 25 0 - 55 U/L   Total Protein 6.6 6.4 - 8.3 g/dL   Albumin 2.9 (L) 3.5 - 5.0 g/dL   Calcium 10.3 8.4 - 10.4 mg/dL   Anion Gap 12 (H) 3 - 11 mEq/L   EGFR 65 (L) >90 ml/min/1.73 m2     Studies/Results:  Kisner, Chyanne Collected: 03/01/2015 Client: [facility] Accession: [id number] Received: 03/01/2015 Evlyn Clines, MDPATHOLOGY FINAL DIAGNOSIS Diagnosis Labium, biopsy, left labia majora - INVASIVE SQUAMOUS CELL CARCINOMA. Microscopic Comment The diagnosis is supported with strong immunohistochemical staining for cytokeratin AE1/AE3.   Medications: I have reviewed the patient's current medications. She will increase tramadol from present 50 mg at hs to 50-100 mg q 8 hr prn. EMLA to lovenox sites. Decadron x 3 days beginning day prior to  taxotere  DISCUSSION Results of vulvar biopsy discussed.  Patient wants to try taxotere cycle 2 as planned 10-27, tho understands that we may be close to maximum benefit from any chemo, and that priority needs to be quality of life as this is not a situation of potential cure. She will have neulasta on 10-29 and I will see her 1 week and 2 weeks after treatment. If further radiation is recommended, we would not give concomitant taxotere.  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 Improved local disease with RT/ sensitizing CDDP completed 04-23-14, and additional taxol CDDP thru 07-27-14.Progression at vulva now. To see Dr Isidore Moos, taxotere 10-27, topicals and increase tramadol. 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 Post SBRT to dominant LLL mass. Progressive, extensive pulmonary mets despite nivolumab. Cannot use ramucirumab as planned due to DVT and anticoagulation. Docetaxel begun in attempt to improve/ control disease, tho unless she gets prompt response she likely will not be able to continue aggressive treatment much longer. Cycle 2 docetaxel 03-11-15, dose reduced due to frail status, recent radiation, poor tolerance of cycle 1 initially. She would be appropriate for Hospice when chemo is no longer used. Prn albuterol inhaler 3.RLE DVT extensive, found with venous doppler 02-05-15. Continue lovenox indefinitely, or unless bleeding, significant thrombocytopenia, or terminal stages of metastatic cancer. 4.History of severe dermatitis and eczema since childhood, with severe flares during initial nivolumab treatment. Dermatology available if needed, their help appreciated. 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 With other skin texture changes on nivolumab, possibly this thickening is related to radiation + nivolumab.  9. Advance Directives done. Patient understands that treatment is not expected to be curative.  10.increased GERD controlled with protonix daily  11.anemia related to chemo and pelvic RT: improved, continue oral iron. Thrombocytopenia resolved. 12constipation from nivolumab improved with bowel regimen now 13. flu vaccine given 02-12-15 14.erythema at gum not tender: biotene and salt water rinses 15..progressive weight loss: [employer] dietician to see also on 10-27. Encouraged Boost and po fluids. IVF today and rate of IVF increased with upcoming chemo.  All questions answered. Chemo orders adjusted and confirmed, neulasta. Time spent 25 min including >50% counseling and coordination of care.    LIVESAY,LENNIS P, MD   03/08/2015, 2:14 PM

## 2015-03-08 NOTE — Telephone Encounter (Signed)
TC from patient requesting refill on the decadron she takes prior to chemo. She has chemo this Thursday.  Prescription escribed this afternoon.

## 2015-03-09 NOTE — Progress Notes (Signed)
HI, Got your msg today re: Jody Beard.  She is scheduled to see you 03/19/15 at 1:30 pm  Thanks, Santiago Glad

## 2015-03-10 DIAGNOSIS — D6481 Anemia due to antineoplastic chemotherapy: Secondary | ICD-10-CM | POA: Insufficient documentation

## 2015-03-10 DIAGNOSIS — E860 Dehydration: Secondary | ICD-10-CM | POA: Insufficient documentation

## 2015-03-10 DIAGNOSIS — T451X5A Adverse effect of antineoplastic and immunosuppressive drugs, initial encounter: Secondary | ICD-10-CM

## 2015-03-11 ENCOUNTER — Ambulatory Visit (HOSPITAL_BASED_OUTPATIENT_CLINIC_OR_DEPARTMENT_OTHER): Payer: 59

## 2015-03-11 ENCOUNTER — Ambulatory Visit: Payer: 59 | Admitting: Nutrition

## 2015-03-11 VITALS — BP 105/71 | HR 83 | Temp 97.8°F | Resp 21

## 2015-03-11 DIAGNOSIS — C3432 Malignant neoplasm of lower lobe, left bronchus or lung: Secondary | ICD-10-CM

## 2015-03-11 DIAGNOSIS — C3492 Malignant neoplasm of unspecified part of left bronchus or lung: Secondary | ICD-10-CM

## 2015-03-11 DIAGNOSIS — C541 Malignant neoplasm of endometrium: Secondary | ICD-10-CM | POA: Diagnosis not present

## 2015-03-11 DIAGNOSIS — Z5111 Encounter for antineoplastic chemotherapy: Secondary | ICD-10-CM | POA: Diagnosis not present

## 2015-03-11 DIAGNOSIS — C778 Secondary and unspecified malignant neoplasm of lymph nodes of multiple regions: Secondary | ICD-10-CM | POA: Diagnosis not present

## 2015-03-11 DIAGNOSIS — C519 Malignant neoplasm of vulva, unspecified: Secondary | ICD-10-CM

## 2015-03-11 DIAGNOSIS — C3431 Malignant neoplasm of lower lobe, right bronchus or lung: Secondary | ICD-10-CM

## 2015-03-11 DIAGNOSIS — C7802 Secondary malignant neoplasm of left lung: Principal | ICD-10-CM

## 2015-03-11 DIAGNOSIS — C7801 Secondary malignant neoplasm of right lung: Secondary | ICD-10-CM

## 2015-03-11 DIAGNOSIS — C792 Secondary malignant neoplasm of skin: Secondary | ICD-10-CM | POA: Diagnosis not present

## 2015-03-11 MED ORDER — SODIUM CHLORIDE 0.9 % IJ SOLN
10.0000 mL | INTRAMUSCULAR | Status: DC | PRN
  Filled 2015-03-11: qty 10

## 2015-03-11 MED ORDER — SODIUM CHLORIDE 0.9 % IV SOLN
Freq: Once | INTRAVENOUS | Status: AC
  Administered 2015-03-11: 14:00:00 via INTRAVENOUS
  Filled 2015-03-11: qty 4

## 2015-03-11 MED ORDER — HEPARIN SOD (PORK) LOCK FLUSH 100 UNIT/ML IV SOLN
500.0000 [IU] | Freq: Once | INTRAVENOUS | Status: AC | PRN
  Administered 2015-03-11: 500 [IU]
  Filled 2015-03-11: qty 5

## 2015-03-11 MED ORDER — DIPHENHYDRAMINE HCL 50 MG/ML IJ SOLN
INTRAMUSCULAR | Status: AC
  Filled 2015-03-11: qty 1

## 2015-03-11 MED ORDER — SODIUM CHLORIDE 0.9 % IV SOLN
Freq: Once | INTRAVENOUS | Status: AC
  Administered 2015-03-11: 13:00:00 via INTRAVENOUS

## 2015-03-11 MED ORDER — DEXTROSE 5 % IV SOLN
37.5000 mg/m2 | Freq: Once | INTRAVENOUS | Status: AC
  Administered 2015-03-11: 60 mg via INTRAVENOUS
  Filled 2015-03-11: qty 6

## 2015-03-11 MED ORDER — DIPHENHYDRAMINE HCL 50 MG/ML IJ SOLN
50.0000 mg | Freq: Once | INTRAMUSCULAR | Status: AC
  Administered 2015-03-11: 50 mg via INTRAVENOUS

## 2015-03-11 MED ORDER — ACETAMINOPHEN 325 MG PO TABS
ORAL_TABLET | ORAL | Status: AC
  Filled 2015-03-11: qty 2

## 2015-03-11 MED ORDER — ACETAMINOPHEN 325 MG PO TABS
650.0000 mg | ORAL_TABLET | Freq: Once | ORAL | Status: AC
  Administered 2015-03-11: 325 mg via ORAL

## 2015-03-11 NOTE — Patient Instructions (Signed)
Docetaxel injection  What is this medicine?  DOCETAXEL (doe se TAX el) is a chemotherapy drug. It targets fast dividing cells, like cancer cells, and causes these cells to die. This medicine is used to treat many types of cancers like breast cancer, certain stomach cancers, head and neck cancer, lung cancer, and prostate cancer.  This medicine may be used for other purposes; ask your health care provider or pharmacist if you have questions.  What should I tell my health care provider before I take this medicine?  They need to know if you have any of these conditions:  -infection (especially a virus infection such as chickenpox, cold sores, or herpes)  -liver disease  -low blood counts, like low white cell, platelet, or red cell counts  -an unusual or allergic reaction to docetaxel, polysorbate 80, other chemotherapy agents, other medicines, foods, dyes, or preservatives  -pregnant or trying to get pregnant  -breast-feeding  How should I use this medicine?  This drug is given as an infusion into a vein. It is administered in a hospital or clinic by a specially trained health care professional.  Talk to your pediatrician regarding the use of this medicine in children. Special care may be needed.  Overdosage: If you think you have taken too much of this medicine contact a poison control center or emergency room at once.  NOTE: This medicine is only for you. Do not share this medicine with others.  What if I miss a dose?  It is important not to miss your dose. Call your doctor or health care professional if you are unable to keep an appointment.  What may interact with this medicine?  -cyclosporine  -erythromycin  -ketoconazole  -medicines to increase blood counts like filgrastim, pegfilgrastim, sargramostim  -vaccines  Talk to your doctor or health care professional before taking any of these medicines:  -acetaminophen  -aspirin  -ibuprofen  -ketoprofen  -naproxen  This list may not describe all possible interactions.  Give your health care provider a list of all the medicines, herbs, non-prescription drugs, or dietary supplements you use. Also tell them if you smoke, drink alcohol, or use illegal drugs. Some items may interact with your medicine.  What should I watch for while using this medicine?  Your condition will be monitored carefully while you are receiving this medicine. You will need important blood work done while you are taking this medicine.  This drug may make you feel generally unwell. This is not uncommon, as chemotherapy can affect healthy cells as well as cancer cells. Report any side effects. Continue your course of treatment even though you feel ill unless your doctor tells you to stop.  In some cases, you may be given additional medicines to help with side effects. Follow all directions for their use.  Call your doctor or health care professional for advice if you get a fever, chills or sore throat, or other symptoms of a cold or flu. Do not treat yourself. This drug decreases your body's ability to fight infections. Try to avoid being around people who are sick.  This medicine may increase your risk to bruise or bleed. Call your doctor or health care professional if you notice any unusual bleeding.  This medicine may contain alcohol in the product. You may get drowsy or dizzy. Do not drive, use machinery, or do anything that needs mental alertness until you know how this medicine affects you. Do not stand or sit up quickly, especially if you are an older   patient. This reduces the risk of dizzy or fainting spells. Avoid alcoholic drinks.  Do not become pregnant while taking this medicine. Women should inform their doctor if they wish to become pregnant or think they might be pregnant. There is a potential for serious side effects to an unborn child. Talk to your health care professional or pharmacist for more information. Do not breast-feed an infant while taking this medicine.  What side effects may I notice  from receiving this medicine?  Side effects that you should report to your doctor or health care professional as soon as possible:  -allergic reactions like skin rash, itching or hives, swelling of the face, lips, or tongue  -low blood counts - This drug may decrease the number of white blood cells, red blood cells and platelets. You may be at increased risk for infections and bleeding.  -signs of infection - fever or chills, cough, sore throat, pain or difficulty passing urine  -signs of decreased platelets or bleeding - bruising, pinpoint red spots on the skin, black, tarry stools, nosebleeds  -signs of decreased red blood cells - unusually weak or tired, fainting spells, lightheadedness  -breathing problems  -fast or irregular heartbeat  -low blood pressure  -mouth sores  -nausea and vomiting  -pain, swelling, redness or irritation at the injection site  -pain, tingling, numbness in the hands or feet  -swelling of the ankle, feet, hands  -weight gain  Side effects that usually do not require medical attention (report to your prescriber or health care professional if they continue or are bothersome):  -bone pain  -complete hair loss including hair on your head, underarms, pubic hair, eyebrows, and eyelashes  -diarrhea  -excessive tearing  -changes in the color of fingernails  -loosening of the fingernails  -nausea  -muscle pain  -red flush to skin  -sweating  -weak or tired  This list may not describe all possible side effects. Call your doctor for medical advice about side effects. You may report side effects to FDA at 1-800-FDA-1088.  Where should I keep my medicine?  This drug is given in a hospital or clinic and will not be stored at home.  NOTE: This sheet is a summary. It may not cover all possible information. If you have questions about this medicine, talk to your doctor, pharmacist, or health care provider.      2016, [employer] Standard. (2014-05-18 16:04:57)

## 2015-03-11 NOTE — Progress Notes (Signed)
Nutrition follow-up completed with patient during chemotherapy. Weight decreased and documented as 108.6 pounds October 24, down from 114.2 pounds September 28. Patient reports she is now drinking 2 oral nutrition supplements daily. She continues to eat very small amounts. Patient having difficulty avoiding dehydration.  Nutrition diagnosis: Unintended weight loss continues.  Intervention:  Encouraged patient to continue to work to increase calories and protein, and consume 2-3 oral nutrition supplements daily. Educated patient on signs and symptoms of dehydration and how to increase fluids.   Provided fact sheet on dehydration. Provided samples and coupons. Teach back method was used.  Monitoring, evaluation, goals: Patient will work to continue to increase calories and protein.  Next visit: Thursday, November 17, during infusion.  **Disclaimer: This note was dictated with voice recognition software. Similar sounding words can inadvertently be transcribed and this note may contain transcription errors which may not have been corrected upon publication of note.**

## 2015-03-12 ENCOUNTER — Telehealth: Payer: Self-pay

## 2015-03-12 NOTE — Telephone Encounter (Signed)
Ms. Wirthlin called today asking for a return call. I spoke with her and she is concerned about her left leg swelling. She has a history of a clot in that leg, and is currently on lovenox per her statement. She states the leg is slightly improved from yesterday, but was worried with the weekend coming. I encouraged her to keep her leg elevated, and if her leg seemed to be getting worse on her observation, to go to the Emergency Department. I explained that there is also someone on call for the [place] over the weekend if she needed anything, or had any questions.

## 2015-03-13 ENCOUNTER — Ambulatory Visit (HOSPITAL_BASED_OUTPATIENT_CLINIC_OR_DEPARTMENT_OTHER): Payer: 59

## 2015-03-13 VITALS — BP 118/65 | HR 102 | Temp 97.5°F | Resp 20

## 2015-03-13 DIAGNOSIS — C519 Malignant neoplasm of vulva, unspecified: Secondary | ICD-10-CM

## 2015-03-13 DIAGNOSIS — Z5189 Encounter for other specified aftercare: Secondary | ICD-10-CM | POA: Diagnosis not present

## 2015-03-13 DIAGNOSIS — C7801 Secondary malignant neoplasm of right lung: Secondary | ICD-10-CM

## 2015-03-13 DIAGNOSIS — C3492 Malignant neoplasm of unspecified part of left bronchus or lung: Secondary | ICD-10-CM

## 2015-03-13 DIAGNOSIS — C7802 Secondary malignant neoplasm of left lung: Principal | ICD-10-CM

## 2015-03-13 MED ORDER — PEGFILGRASTIM INJECTION 6 MG/0.6ML ~~LOC~~
6.0000 mg | PREFILLED_SYRINGE | Freq: Once | SUBCUTANEOUS | Status: AC
  Administered 2015-03-13: 6 mg via SUBCUTANEOUS

## 2015-03-19 ENCOUNTER — Telehealth: Payer: Self-pay

## 2015-03-19 ENCOUNTER — Ambulatory Visit: Payer: 59

## 2015-03-19 ENCOUNTER — Ambulatory Visit
Admission: RE | Admit: 2015-03-19 | Discharge: 2015-03-19 | Disposition: A | Discharge status: Home / Self Care | Payer: 59 | Source: Ambulatory Visit | Attending: Radiation Oncology | Admitting: Radiation Oncology

## 2015-03-19 NOTE — Telephone Encounter (Signed)
I called Ms. Pates and left a voice mail message regarding her missed appointment with Dr. Isidore Moos today at 3:00. I asked her to call me back when she was able to do so.

## 2015-03-21 ENCOUNTER — Other Ambulatory Visit: Payer: Self-pay | Admitting: Oncology

## 2015-03-22 ENCOUNTER — Other Ambulatory Visit (HOSPITAL_BASED_OUTPATIENT_CLINIC_OR_DEPARTMENT_OTHER): Payer: 59

## 2015-03-22 ENCOUNTER — Ambulatory Visit (HOSPITAL_BASED_OUTPATIENT_CLINIC_OR_DEPARTMENT_OTHER): Payer: 59 | Admitting: Oncology

## 2015-03-22 ENCOUNTER — Ambulatory Visit (HOSPITAL_COMMUNITY)
Admission: RE | Admit: 2015-03-22 | Discharge: 2015-03-22 | Disposition: A | Discharge status: Home / Self Care | Payer: 59 | Source: Ambulatory Visit | Attending: Oncology | Admitting: Oncology

## 2015-03-22 VITALS — BP 95/56 | HR 109 | Temp 98.1°F | Resp 18 | Ht 61.0 in | Wt 106.0 lb

## 2015-03-22 DIAGNOSIS — C3431 Malignant neoplasm of lower lobe, right bronchus or lung: Secondary | ICD-10-CM

## 2015-03-22 DIAGNOSIS — C792 Secondary malignant neoplasm of skin: Secondary | ICD-10-CM | POA: Diagnosis not present

## 2015-03-22 DIAGNOSIS — C3432 Malignant neoplasm of lower lobe, left bronchus or lung: Secondary | ICD-10-CM

## 2015-03-22 DIAGNOSIS — C519 Malignant neoplasm of vulva, unspecified: Secondary | ICD-10-CM

## 2015-03-22 DIAGNOSIS — C799 Secondary malignant neoplasm of unspecified site: Secondary | ICD-10-CM

## 2015-03-22 DIAGNOSIS — C801 Malignant (primary) neoplasm, unspecified: Secondary | ICD-10-CM | POA: Insufficient documentation

## 2015-03-22 DIAGNOSIS — R1031 Right lower quadrant pain: Secondary | ICD-10-CM

## 2015-03-22 DIAGNOSIS — Z7901 Long term (current) use of anticoagulants: Secondary | ICD-10-CM

## 2015-03-22 DIAGNOSIS — C3492 Malignant neoplasm of unspecified part of left bronchus or lung: Secondary | ICD-10-CM | POA: Insufficient documentation

## 2015-03-22 DIAGNOSIS — I82401 Acute embolism and thrombosis of unspecified deep veins of right lower extremity: Secondary | ICD-10-CM

## 2015-03-22 DIAGNOSIS — IMO0002 Reserved for concepts with insufficient information to code with codable children: Secondary | ICD-10-CM

## 2015-03-22 DIAGNOSIS — C7801 Secondary malignant neoplasm of right lung: Secondary | ICD-10-CM

## 2015-03-22 DIAGNOSIS — R410 Disorientation, unspecified: Secondary | ICD-10-CM

## 2015-03-22 DIAGNOSIS — C778 Secondary and unspecified malignant neoplasm of lymph nodes of multiple regions: Secondary | ICD-10-CM

## 2015-03-22 DIAGNOSIS — C541 Malignant neoplasm of endometrium: Secondary | ICD-10-CM

## 2015-03-22 DIAGNOSIS — K219 Gastro-esophageal reflux disease without esophagitis: Secondary | ICD-10-CM

## 2015-03-22 DIAGNOSIS — C7802 Secondary malignant neoplasm of left lung: Principal | ICD-10-CM

## 2015-03-22 LAB — COMPREHENSIVE METABOLIC PANEL (CC13)
ALBUMIN: 2.9 g/dL — AB (ref 3.5–5.0)
ALK PHOS: 511 U/L — AB (ref 40–150)
ALT: 54 U/L (ref 0–55)
AST: 43 U/L — ABNORMAL HIGH (ref 5–34)
Anion Gap: 15 mEq/L — ABNORMAL HIGH (ref 3–11)
BILIRUBIN TOTAL: 0.37 mg/dL (ref 0.20–1.20)
BUN: 16.8 mg/dL (ref 7.0–26.0)
CALCIUM: 10.3 mg/dL (ref 8.4–10.4)
CO2: 21 mEq/L — ABNORMAL LOW (ref 22–29)
Chloride: 98 mEq/L (ref 98–109)
Creatinine: 1 mg/dL (ref 0.6–1.1)
EGFR: 59 mL/min/{1.73_m2} — AB (ref >90)
GLUCOSE: 116 mg/dL (ref 70–140)
POTASSIUM: 4.4 meq/L (ref 3.5–5.1)
SODIUM: 133 meq/L — AB (ref 136–145)
Total Protein: 6.5 g/dL (ref 6.4–8.3)

## 2015-03-22 LAB — CBC WITH DIFFERENTIAL/PLATELET
BASO%: 0.1 % (ref 0.0–2.0)
BASOS ABS: 0 10*3/uL (ref 0.0–0.1)
EOS ABS: 0 10*3/uL (ref 0.0–0.5)
EOS%: 0.1 % (ref 0.0–7.0)
HEMATOCRIT: 35.7 % (ref 34.8–46.6)
HEMOGLOBIN: 11.2 g/dL — AB (ref 11.6–15.9)
LYMPH#: 1.1 10*3/uL (ref 0.9–3.3)
LYMPH%: 3.9 % — ABNORMAL LOW (ref 14.0–49.7)
MCH: 28 pg (ref 25.1–34.0)
MCHC: 31.4 g/dL — ABNORMAL LOW (ref 31.5–36.0)
MCV: 89.3 fL (ref 79.5–101.0)
MONO#: 1 10*3/uL — AB (ref 0.1–0.9)
MONO%: 3.7 % (ref 0.0–14.0)
NEUT%: 92.2 % — ABNORMAL HIGH (ref 38.4–76.8)
NEUTROS ABS: 25.4 10*3/uL — AB (ref 1.5–6.5)
PLATELETS: 314 10*3/uL (ref 145–400)
RBC: 4 10*6/uL (ref 3.70–5.45)
RDW: 17.1 % — AB (ref 11.2–14.5)
WBC: 27.6 10*3/uL — AB (ref 3.9–10.3)

## 2015-03-22 MED ORDER — HYDROCODONE-ACETAMINOPHEN 5-325 MG PO TABS: ORAL_TABLET | ORAL | Status: DC

## 2015-03-22 NOTE — Progress Notes (Signed)
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 She would be due cycle 3 taxotere next week, however evidence today of further progression in lungs and perineum, symptoms concerning for brain mets and PS too poor to continue any chemo. She missed reconsult with Dr Isidore Moos re additional radiation to right groin. We will not reschedule this now due to overall situation. CXR today shows progression of pulmonary mets.  Patient has been in bed "90%" of time. She cannot walk without assistance, weak in general and balance poor. PO intake food and liquids not optimal, with poor appetite tho not nausea or vomiting. She continues lovenox for RLE DVT, only overt bleeding is small amount from skin breakdown at right groin. Pain is mostly right groin area, not controlled with tramadol, which also seems to cause some confusion. She notices "black spots" in vision intermittently. She denies increased SOB with present minimal activity. The gum irritation left upper is minimally better, not using biotene as instructed. No new or different pain. New skin lesions in suprapubic area not actually painful. Voiding a little. No diarrhea. No other skin rash. Some confusion and difficulty with conversation per daughter. Remainder of 10 point Review of Systems negative/ unchanged   PAC in, flushed 03-11-15. Flu vaccine done 02-12-15   ONCOLOGIC HISTORY Patient presented with gradually enlarging mass at right vulva for ~ 8 months, uncomfortable with direct pressure and ulcerated with occasional bleeding. She was seen in ED on 02-02-14 with right inguinal adenopathy and right vulvar masses, by Dr Elonda Husky on  02-03-14, then by Dr Skeet Latch on 02-05-14, with biopsy of both vulvar mass and satellite lesions on inner thigh and mons. Exam found 7 cm right vulvar mass with 4 cm extension to distal vagina, extending anteriorly to urethral meatus and in ischiorectal area, with satellite lesions inner thigh and right mons. Pathology [id number]) showed invasive squamous cell carcinoma arising in background of high grade squamous intraepithelial lesion from vulva biopsy and invasive moderately differentiated squamous cell carcinoma from inner thigh lesion. CXR 02-06-14 showed left lower lobe lung mass 3.6 x 3.6 x 3.6 cm, with no adenopathy and no bony lesions, emphysematous changes. CT chest 02-09-14 showed the lower lobe mass 3.7 x 3.5 x 3.3 cm as well as 3 mm RLL nodule and faint 4 mm LUL nodule, no mediastinal or hilar nodes, no axillary or supraclavicular nodes, and upper abdomen not remarkable. PET 02-18-14 measured LLL lung mass 4.6 x 3.9 cm, hypermetabolic, and noted multiple subcentimeter nodules scattered thruout lungs bilaterally; the right vulvar mass was also hypermetabolic, extending up into right side of lower vagina, extensive R>L and right external iliac adenopathy, no uptake in skeleton. 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 She had 3 stereotactic radiation treatments to the LLL pulmonary mass 54 Gy in 3 fractions 10-16, 10-19 and 03-04-14. She received IMRT to vulvar area from 03-09-14, completed 04-23-14, with weekly CDDP x7 03-09-14 thru 04-20-14. Radiation to vulva, vaginal, and pelvic/inguinal nodes was 60 Gy in 30 fractions to gross disease, 54 Gy in 30 fractions to high risk areas, 50.4Gy in 30 fractions to intermediate risk areas. She had vulvar, vaginal, and nodal boost of 6 Gy in 3  fractions to gross disease. Follow up CT chest 05-04-14 had cavitary, spiculated LLL mass 3.3 x 2.3 cm, having been 4.6 x 3.9 cm prior to IMRT, and no change in  scattered small bilateral pulmonary nodules, however PET 05-27-14 had progression in bilateral pulmonary nodules and partial response in local vulvar involvement. 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 CT CAP 08-10-14 had majority of lung nodules stable with a few slightly increased, 2 mm hepatic lucencies possibly cysts, no adenopathy AP. She had first nivolumab 09-14-14, dose reduced by 50% due to concerns for skin toxicity, treated x 7 cycles thru 01-22-15; progression documented in bilateral lung mets at that point. She had extensive RLE DVT 02-05-15, begun on lovenox. First taxotere given 02-18-15 (ramucirumab not used with the anticoagulation).     Objective:  Vital signs in last 24 hours:  BP 95/56 mmHg  Pulse 109  Temp(Src) 98.1 F (36.7 C) (Oral)  Resp 18  Ht 5' 1"  (1.549 m)  Wt 106 lb (48.081 kg)  BMI 20.04 kg/m2  SpO2 98% Weight down another 2.5 lbs   Respirations slightly labored with repositioning and other minimal exertion. Color poor but not cyanotic or icteric Alert, oriented and appropriate. In WC, sitting with right leg flexed due to inguinal discomfort. Speech fluent and mostly appropriate, follows directions, very pleasant as always. Alopecia  HEENT:PERRL, sclerae not icteric. Oral mucosa somewhat dry, posterior pharynx with dull erythema bilaterally. Area right upper gum slightly puffy and mildly erythematous, no ulceration Neck supple. No JVD.  Lymphatics:no cervical,supraclavicular adenopathy Resp: diminished BS thruout, no wheezes or rales, diminished in bases without dullness to percussion. No use of accessory muscles Cardio: regular rate and rhythm. No gallop. GI: soft, nontender, scaphoid not distended, no mass or organomegaly. Few bowel sounds.  Musculoskeletal/ Extremities:trace edema right ankle otherwise  without pitting edema, cords, tenderness. Moves all extremities Neuro: CN appear intact. No sensory  changes. Speech as noted. No tremors. Did not try to stand or walk Skin : multiple new skin lesions ~ 5 mm in mons area, with the superficial ulceration1.5 x 2 cm unchanged. Induration right groin stable, some cracking skin there. Skin otherwise dry without rash, ecchymosis, petechiae Portacath-without erythema or tenderness  Lab Results:  Results for orders placed or performed in visit on 03/22/15  CBC with Differential  Result Value Ref Range   WBC 27.6 (H) 3.9 - 10.3 10e3/uL   NEUT# 25.4 (H) 1.5 - 6.5 10e3/uL   HGB 11.2 (L) 11.6 - 15.9 g/dL   HCT 35.7 34.8 - 46.6 %   Platelets 314 145 - 400 10e3/uL   MCV 89.3 79.5 - 101.0 fL   MCH 28.0 25.1 - 34.0 pg   MCHC 31.4 (L) 31.5 - 36.0 g/dL   RBC 4.00 3.70 - 5.45 10e6/uL   RDW 17.1 (H) 11.2 - 14.5 %   lymph# 1.1 0.9 - 3.3 10e3/uL   MONO# 1.0 (H) 0.1 - 0.9 10e3/uL   Eosinophils Absolute 0.0 0.0 - 0.5 10e3/uL   Basophils Absolute 0.0 0.0 - 0.1 10e3/uL   NEUT% 92.2 (H) 38.4 - 76.8 %   LYMPH% 3.9 (L) 14.0 - 49.7 %   MONO% 3.7 0.0 - 14.0 %   EOS% 0.1 0.0 - 7.0 %   BASO% 0.1 0.0 - 2.0 %  Comprehensive metabolic panel (Cmet) - [facility]  Result Value Ref Range   Sodium 133 (L) 136 - 145 mEq/L   Potassium 4.4 3.5 -  5.1 mEq/L   Chloride 98 98 - 109 mEq/L   CO2 21 (L) 22 - 29 mEq/L   Glucose 116 70 - 140 mg/dl   BUN 16.8 7.0 - 26.0 mg/dL   Creatinine 1.0 0.6 - 1.1 mg/dL   Total Bilirubin 0.37 0.20 - 1.20 mg/dL   Alkaline Phosphatase 511 (H) 40 - 150 U/L   AST 43 (H) 5 - 34 U/L   ALT 54 0 - 55 U/L   Total Protein 6.5 6.4 - 8.3 g/dL   Albumin 2.9 (L) 3.5 - 5.0 g/dL   Calcium 10.3 8.4 - 10.4 mg/dL   Anion Gap 15 (H) 3 - 11 mEq/L   EGFR 59 (L) >90 ml/min/1.73 m2     Studies/Results:  Dg Chest 2 View  03/22/2015  CLINICAL DATA:  Follow-up of known metastatic lung malignancy, no current chest complaints EXAM: CHEST  2 VIEW COMPARISON:  PA and lateral chest x-ray of January 26, 2015 FINDINGS: There has been interval increase in the size and  number of the multiple pulmonary parenchymal nodules bilaterally. The largest nodule lies in the right lower lobe posteriorly and measures approximately 4.7 cm transversely. Previously it had measured approximately 4 cm transversely. There is stable tenting of the left hemidiaphragm. There is no pleural effusion or pneumothorax. The heart and pulmonary vascularity are normal. The power port appliance tip projects over the midportion of the SVC. The bony thorax exhibits no acute abnormality. IMPRESSION: Marked interval progression of widespread pulmonary metastatic disease. Electronically Signed   By: David  Martinique M.D.   On: 03/22/2015 16:14    Medications: I have reviewed the patient's current medications. Will try hydrocodone for now, as she has taken this in past. May need to increase to dilaudid if this does not control pain adequately (has never tried dilaudid, see below)  DISCUSSION: We have discussed clinical and radiographic progression of metastatic cancer; we are concerned that she may also have brain mets and will request CT in next day or so. Performance status and progression consistent with maximum benefit from chemotherapy, which they agree is no longer reasonable to continue. Daughter would like patient to come to her home in [place], tho patient is a little reluctant to leave her husband in [place]. They agree that Hospice would be helpful, tho will need to decide location before that referral is made.  Will stop tramadol and try hydrocodone at low dose for now. Note she has had difficulty tolerating pain medication, mostly with confusion, even prior to the cancer diagnosis.   I have let Dr Denman George know of situation.  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 Progressing locally despite treatment to date. Strictly  palliative interventions most appropriate now 2.Squamous cell carcinoma of LLL lung: long tobacco DCd 2015; imaging and final path suggest primary lung, tho same histology as vulvar primary. Post SBRT to dominant LLL mass. Extensive and progressing bilateral lung mets despite all treatments to date. Will change to strictly palliative care, with Hospice referral when we know which Hospice should be involved. 3.RLE DVT extensive, found with venous doppler 02-05-15. Continue lovenox indefinitely, or unless bleeding, significant thrombocytopenia, or terminal stages of metastatic cancer. Need to evaluate ongoing treatment closely now. 4.History of severe dermatitis and eczema since childhood, with severe flares during initial nivolumab treatment. Dermatology available if needed, their help appreciated. 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 With other skin texture changes on nivolumab, possibly this thickening is related to radiation + nivolumab.  9. Advance Directives in place. Patient and daughter have good understanding of situation 10. GERD controlled with protonix daily  11.. flu vaccine given 02-12-15 14.erythema at gum not tender: biotene and salt water rinses   All questions answered. WIll follow up head CT pending. Needs hospice referral as soon as we know which agency. Cancel chemo and inj next week. She has apt to see me again on 11-14 which we may or may not need depending on rest of situation. Note apt gyn onc 12-5 may or may not need to keep Time spent 30 min including >50% counseling and coordination of care.cc Drs Isidore Moos and Wolfgang Phoenix for update.   Jody Beard P, MD   03/22/2015, 7:54 PM

## 2015-03-23 ENCOUNTER — Telehealth: Payer: Self-pay | Admitting: Oncology

## 2015-03-23 NOTE — Telephone Encounter (Signed)
Appointments cancelled per pof and patient will get a call with ct

## 2015-03-26 ENCOUNTER — Other Ambulatory Visit: Payer: Self-pay | Admitting: Oncology

## 2015-03-26 ENCOUNTER — Encounter: Payer: Self-pay | Admitting: Oncology

## 2015-03-26 ENCOUNTER — Telehealth: Payer: Self-pay | Admitting: Oncology

## 2015-03-26 ENCOUNTER — Other Ambulatory Visit: Payer: Self-pay

## 2015-03-26 ENCOUNTER — Encounter (HOSPITAL_COMMUNITY): Payer: Self-pay

## 2015-03-26 ENCOUNTER — Ambulatory Visit (HOSPITAL_COMMUNITY)
Admission: RE | Admit: 2015-03-26 | Discharge: 2015-03-26 | Disposition: A | Discharge status: Home / Self Care | Payer: 59 | Source: Ambulatory Visit | Attending: Oncology | Admitting: Oncology

## 2015-03-26 ENCOUNTER — Telehealth: Payer: Self-pay

## 2015-03-26 DIAGNOSIS — C519 Malignant neoplasm of vulva, unspecified: Secondary | ICD-10-CM | POA: Diagnosis present

## 2015-03-26 DIAGNOSIS — C3492 Malignant neoplasm of unspecified part of left bronchus or lung: Secondary | ICD-10-CM | POA: Diagnosis not present

## 2015-03-26 DIAGNOSIS — C7801 Secondary malignant neoplasm of right lung: Secondary | ICD-10-CM

## 2015-03-26 DIAGNOSIS — G936 Cerebral edema: Secondary | ICD-10-CM | POA: Diagnosis not present

## 2015-03-26 DIAGNOSIS — C7802 Secondary malignant neoplasm of left lung: Principal | ICD-10-CM

## 2015-03-26 DIAGNOSIS — C3432 Malignant neoplasm of lower lobe, left bronchus or lung: Secondary | ICD-10-CM

## 2015-03-26 DIAGNOSIS — R410 Disorientation, unspecified: Secondary | ICD-10-CM | POA: Insufficient documentation

## 2015-03-26 DIAGNOSIS — C7931 Secondary malignant neoplasm of brain: Secondary | ICD-10-CM | POA: Diagnosis not present

## 2015-03-26 MED ORDER — IOHEXOL 300 MG/ML  SOLN
75.0000 mL | Freq: Once | INTRAMUSCULAR | Status: AC | PRN
  Administered 2015-03-26: 75 mL via INTRAVENOUS

## 2015-03-26 MED ORDER — DEXAMETHASONE 4 MG PO TABS: 4.0000 mg | ORAL_TABLET | Freq: Three times a day (TID) | ORAL | Status: AC

## 2015-03-26 NOTE — Progress Notes (Signed)
Medical Oncology  Report of head CT just done seen by MD now, multiple brain mets. Called daughter's cell and LM for her to call back to office.  Need to let them know results of CT, which unfortunately does show what we suspected. Would begin decadron 4 mg with food tid. Will ask radiation oncology to see for this new problem, known to Dr Isidore Moos.  If patient and family have decided where she will be staying (at her home in [place] or with daughter in [place]), we can speak with correct hospice to make referral. Hospice [facility] may not accept during palliative RT.  Discussed with desk RN. Godfrey Pick, MD

## 2015-03-26 NOTE — Progress Notes (Signed)
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 2. Associated edema but no significant mass effect at this time. No ventriculomegaly or hemorrhage.  Patient presented with symptoms of:   Confusion and loss of balance was noticed at an appointment with Dr. Marko Plume and she sent her for the CT Head.   Past or anticipated interventions, if any, per neurosurgery: None  Past or anticipated interventions, if any, per medical oncology: Dr. Marko Plume spoke with Ms. Braatz daughter on 11/11 to discuss her new brain metastasis. Dr. Marko Plume states: we have discussed Hospice referral and considerations for which agency and timing with possible RT.  Dose of Decadron, if applicable: '4mg'$  every 8 hours with meals.   Recent neurologic symptoms, if any:   Seizures: No  Headaches: No  Nausea: No  Dizziness/ataxia: Mild per patient  Difficulty with hand coordination: No  Focal numbness/weakness: Right Foot, and weakness to both legs  Visual deficits/changes: No  Confusion/Memory deficits: Yes, she reports she can't remember things. For example she forgot her PIN number, and her conversation wanders.  Painful bone metastases at present, if any: None  SAFETY ISSUES:  Prior radiation? Yes to Vulva area and Lung  Pacemaker/ICD? No  Possible current pregnancy? No  Is the patient on methotrexate? No  Additional Complaints / other details: None

## 2015-03-26 NOTE — Telephone Encounter (Signed)
Medical Oncology  Reached daughter Almyra Free on cell [phone]. Told her that we have found brain mets. Recommended starting decadron 4 mg q 8 hrs with food. Told her of consultation with Dr Isidore Moos on 03-31-15 at 10:00. Daughter says "is it even worth radiation?" and I have encouraged her at least to talk with Dr Isidore Moos. Patient could hardly get from house to car today, daughter concerned that she will fall at home. Husband at home most of time tho not always, is diabetic and sometimes has low blood sugars. Daughter wants patient to stay with her, bedroom / BR more convenient also. Either daughter or daughter's husband at home all the time.  We have discussed Hospice referral and considerations for which agency and timing with possible RT. Daughter asking if any assistance with will (regular will), but have told her that she needs to speak with lawyer. Daughter requests that I speak with her mother if possible. She will call mother in ~ 30 min in case I cannot reach her. Daughter plans to keep my apt on 11-14.  Tried again to call patient, LM.  Godfrey Pick, MD

## 2015-03-26 NOTE — Telephone Encounter (Signed)
Medical Oncology  Also tried patient's cell and husband's cell, LM on each for them to return call to [employer]. Need to let them know about head CT, starting decadron and radiation oncology appointment with Dr Isidore Moos on Wed Nov 16 at 1000. See last phone note information also  Tried daughter's cell again, no answer.  Godfrey Pick, MD

## 2015-03-26 NOTE — Telephone Encounter (Signed)
Returned call to [facility].  They were calling report to Dr. Marko Plume.  Patient MRI results available - pt has metastatic disease to brain.  Routed to pod bc 1.

## 2015-03-28 ENCOUNTER — Other Ambulatory Visit: Payer: Self-pay | Admitting: Oncology

## 2015-03-29 ENCOUNTER — Encounter: Payer: Self-pay | Admitting: Oncology

## 2015-03-29 ENCOUNTER — Telehealth: Payer: Self-pay | Admitting: Oncology

## 2015-03-29 ENCOUNTER — Other Ambulatory Visit (HOSPITAL_BASED_OUTPATIENT_CLINIC_OR_DEPARTMENT_OTHER): Payer: 59

## 2015-03-29 ENCOUNTER — Ambulatory Visit (HOSPITAL_BASED_OUTPATIENT_CLINIC_OR_DEPARTMENT_OTHER): Payer: 59 | Admitting: Oncology

## 2015-03-29 VITALS — BP 119/74 | HR 118 | Temp 97.5°F | Resp 17 | Ht 61.0 in | Wt 104.8 lb

## 2015-03-29 DIAGNOSIS — I82511 Chronic embolism and thrombosis of right femoral vein: Secondary | ICD-10-CM

## 2015-03-29 DIAGNOSIS — L259 Unspecified contact dermatitis, unspecified cause: Secondary | ICD-10-CM

## 2015-03-29 DIAGNOSIS — C7801 Secondary malignant neoplasm of right lung: Secondary | ICD-10-CM

## 2015-03-29 DIAGNOSIS — C519 Malignant neoplasm of vulva, unspecified: Secondary | ICD-10-CM

## 2015-03-29 DIAGNOSIS — C792 Secondary malignant neoplasm of skin: Secondary | ICD-10-CM

## 2015-03-29 DIAGNOSIS — C541 Malignant neoplasm of endometrium: Secondary | ICD-10-CM

## 2015-03-29 DIAGNOSIS — C3492 Malignant neoplasm of unspecified part of left bronchus or lung: Secondary | ICD-10-CM

## 2015-03-29 DIAGNOSIS — C7931 Secondary malignant neoplasm of brain: Secondary | ICD-10-CM | POA: Diagnosis not present

## 2015-03-29 DIAGNOSIS — C3431 Malignant neoplasm of lower lobe, right bronchus or lung: Secondary | ICD-10-CM

## 2015-03-29 DIAGNOSIS — IMO0002 Reserved for concepts with insufficient information to code with codable children: Secondary | ICD-10-CM

## 2015-03-29 DIAGNOSIS — C3432 Malignant neoplasm of lower lobe, left bronchus or lung: Secondary | ICD-10-CM

## 2015-03-29 DIAGNOSIS — I82401 Acute embolism and thrombosis of unspecified deep veins of right lower extremity: Secondary | ICD-10-CM

## 2015-03-29 DIAGNOSIS — K219 Gastro-esophageal reflux disease without esophagitis: Secondary | ICD-10-CM

## 2015-03-29 DIAGNOSIS — C778 Secondary and unspecified malignant neoplasm of lymph nodes of multiple regions: Secondary | ICD-10-CM

## 2015-03-29 DIAGNOSIS — C7802 Secondary malignant neoplasm of left lung: Secondary | ICD-10-CM

## 2015-03-29 DIAGNOSIS — I82411 Acute embolism and thrombosis of right femoral vein: Secondary | ICD-10-CM

## 2015-03-29 DIAGNOSIS — C799 Secondary malignant neoplasm of unspecified site: Secondary | ICD-10-CM

## 2015-03-29 DIAGNOSIS — Z7901 Long term (current) use of anticoagulants: Secondary | ICD-10-CM

## 2015-03-29 DIAGNOSIS — Z95828 Presence of other vascular implants and grafts: Secondary | ICD-10-CM

## 2015-03-29 LAB — CBC WITH DIFFERENTIAL/PLATELET
BASO%: 0.1 % (ref 0.0–2.0)
BASOS ABS: 0 10*3/uL (ref 0.0–0.1)
EOS ABS: 0 10*3/uL (ref 0.0–0.5)
EOS%: 0 % (ref 0.0–7.0)
HEMATOCRIT: 36.6 % (ref 34.8–46.6)
HGB: 11.3 g/dL — ABNORMAL LOW (ref 11.6–15.9)
LYMPH#: 1.3 10*3/uL (ref 0.9–3.3)
LYMPH%: 4.5 % — ABNORMAL LOW (ref 14.0–49.7)
MCH: 28 pg (ref 25.1–34.0)
MCHC: 30.9 g/dL — AB (ref 31.5–36.0)
MCV: 90.8 fL (ref 79.5–101.0)
MONO#: 1.6 10*3/uL — ABNORMAL HIGH (ref 0.1–0.9)
MONO%: 5.5 % (ref 0.0–14.0)
NEUT#: 26.3 10*3/uL — ABNORMAL HIGH (ref 1.5–6.5)
NEUT%: 89.9 % — AB (ref 38.4–76.8)
Platelets: 405 10*3/uL — ABNORMAL HIGH (ref 145–400)
RBC: 4.03 10*6/uL (ref 3.70–5.45)
RDW: 17.2 % — ABNORMAL HIGH (ref 11.2–14.5)
WBC: 29.3 10*3/uL — ABNORMAL HIGH (ref 3.9–10.3)

## 2015-03-29 LAB — COMPREHENSIVE METABOLIC PANEL (CC13)
ALT: 54 U/L (ref 0–55)
ANION GAP: 13 meq/L — AB (ref 3–11)
AST: 46 U/L — AB (ref 5–34)
Albumin: 3.3 g/dL — ABNORMAL LOW (ref 3.5–5.0)
Alkaline Phosphatase: 405 U/L — ABNORMAL HIGH (ref 40–150)
BUN: 25.6 mg/dL (ref 7.0–26.0)
CHLORIDE: 102 meq/L (ref 98–109)
CO2: 22 meq/L (ref 22–29)
CREATININE: 0.8 mg/dL (ref 0.6–1.1)
Calcium: 10.6 mg/dL — ABNORMAL HIGH (ref 8.4–10.4)
EGFR: 74 mL/min/{1.73_m2} — ABNORMAL LOW (ref >90)
Glucose: 107 mg/dl (ref 70–140)
Potassium: 4.8 mEq/L (ref 3.5–5.1)
Sodium: 138 mEq/L (ref 136–145)
TOTAL PROTEIN: 6.8 g/dL (ref 6.4–8.3)

## 2015-03-29 MED ORDER — HYDROCODONE-ACETAMINOPHEN 5-325 MG PO TABS: ORAL_TABLET | ORAL | Status: DC

## 2015-03-29 MED ORDER — WARFARIN SODIUM 5 MG PO TABS: ORAL_TABLET | ORAL | Status: AC

## 2015-03-29 NOTE — Telephone Encounter (Signed)
Appointments made and avs pritned for patient °

## 2015-03-29 NOTE — Telephone Encounter (Signed)
NUT appointment cancelled per dr ll pof

## 2015-03-29 NOTE — Patient Instructions (Signed)
Start coumadin today, 5 mg ~ 4 PM  Take coumadin 5 mg also ~ 4 PM on 11-15 We will check coumadin level when you come to see Dr Isidore Moos on 11-16 Dr Mariana Kaufman nurse will call Almyra Free with instructions for the coumadin then.  Hopefully we will be able to stop the lovenox shots end of this week.  You can take the norco every 4-6 hours as needed for pain  You may need to increase miralax to twice daily, to keep bowels moving daily.   [facility] will call Almyra Free to set up time to meet you

## 2015-03-29 NOTE — Progress Notes (Signed)
OFFICE PROGRESS NOTE   March 29, 2015   Physicians:Emma Rossi/ W.Skeet Latch ,[name], Alfredia Ferguson.Luking (PCP)  INTERVAL HISTORY:   Patient is seen, together with daughter, now known to have multiple brain mets by CT head 03-26-15. She began decadron 4 mg tid also on 03-26-15 and will see Dr Isidore Moos on 03-31-15 to discuss possible cranial radiation. Patient and daughter would like Hospice referral. She continues lovenox for RLE DVT 02-05-15 involving right common femoral, right femoral and the distal veins (see below). Chemo was [facility] after cycle 2 taxotere 03-11-15.  Patient has had some improvement in mobility, appetite, energy since addition of decadron. She is able to tolerate norco, using this probably twice daily for pain in right groin; note confusion remotely with pain meds including oxycodone. She got from house to car with max asistance of daughter and husband today. She denies SOB or productive cough. She denies bleeding, dislikes lovenox injections and is asking to change to oral agent. Bowels are moving better with miralax. No fever on the steroids. No mucositis symptoms. No problems with PAC. No vomiting. No drainage from progressive ulcerations in perineal area. Less swelling RLE.  Remainder of 10 point Review of Systems negative.   PAC in, flushed 03-11-15. Flu vaccine done 02-12-15   ONCOLOGIC HISTORY Patient presented with gradually enlarging mass at right vulva for ~ 8 months, uncomfortable with direct pressure and ulcerated with occasional bleeding. She was seen in ED on 02-02-14 with right inguinal adenopathy and right vulvar masses, by Dr Elonda Husky on 02-03-14, then by Dr Skeet Latch on 02-05-14, with biopsy of both vulvar mass and satellite lesions on inner thigh and mons. Exam found 7 cm right vulvar mass with 4 cm extension to distal vagina, extending anteriorly to urethral meatus and in ischiorectal area, with satellite lesions inner thigh  and right mons. Pathology [phone]) showed invasive squamous cell carcinoma arising in background of high grade squamous intraepithelial lesion from vulva biopsy and invasive moderately differentiated squamous cell carcinoma from inner thigh lesion. CXR 02-06-14 showed left lower lobe lung mass 3.6 x 3.6 x 3.6 cm, with no adenopathy and no bony lesions, emphysematous changes. CT chest 02-09-14 showed the lower lobe mass 3.7 x 3.5 x 3.3 cm as well as 3 mm RLL nodule and faint 4 mm LUL nodule, no mediastinal or hilar nodes, no axillary or supraclavicular nodes, and upper abdomen not remarkable. PET 02-18-14 measured LLL lung mass 4.6 x 3.9 cm, hypermetabolic, and noted multiple subcentimeter nodules scattered thruout lungs bilaterally; the right vulvar mass was also hypermetabolic, extending up into right side of lower vagina, extensive R>L and right external iliac adenopathy, no uptake in skeleton. 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 She had 3 stereotactic radiation treatments to the LLL pulmonary mass 54 Gy in 3 fractions 10-16, 10-19 and 03-04-14. She received IMRT to vulvar area from 03-09-14, completed 04-23-14, with weekly CDDP x7 03-09-14 thru 04-20-14. Radiation to vulva, vaginal, and pelvic/inguinal nodes was 60 Gy in 30 fractions to gross disease, 54 Gy in 30 fractions to high risk areas, 50.4Gy in 30 fractions to intermediate risk areas. She had vulvar, vaginal, and nodal boost of 6 Gy in 3 fractions to gross disease. Follow up CT chest 05-04-14 had cavitary, spiculated LLL mass 3.3 x 2.3 cm, having been 4.6 x 3.9 cm prior to IMRT, and no change in scattered small bilateral pulmonary nodules, however PET 05-27-14 had progression  in bilateral pulmonary nodules and partial response in local vulvar involvement. 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 CT CAP 08-10-14 had majority of  lung nodules stable with a few slightly increased, 2 mm hepatic lucencies possibly cysts, no adenopathy AP. She had first nivolumab 09-14-14, dose reduced by 50% due to concerns for skin toxicity, treated x 7 cycles thru 01-22-15; progression documented in bilateral lung mets at that point. She had extensive RLE DVT 02-05-15, begun on lovenox. First taxotere given 02-18-15 (ramucirumab not used with the anticoagulation), and cycle 2 on 03-11-15 with neulasta. CXR 03-22-15 showed progression of multiple bilateral pulmonary mets. CT head 03-26-15 showed at least 6 brain mets,  with small associated edema, no bleed.     Objective:  Vital signs in last 24 hours:  BP 119/74 mmHg  Pulse 118  Temp(Src) 97.5 F (36.4 C) (Oral)  Resp 17  Ht 5' 1"  (1.549 m)  Wt 104 lb 12.8 oz (47.537 kg)  BMI 19.81 kg/m2  SpO2 98%   Sitting in WC, respirations not labored RA. Alert and appropriate, looks brighter and conversation more clear than last week.   HEENT:PERRL, sclerae not icteric. Oral mucosa moist without lesions, posterior pharynx clear.  Neck supple. No JVD.  Lymphatics:no supraclavicular adenopathy Resp: diminished BS without wheezes or rales bilaterally and no dullness to percussion bilaterally Cardio: regular rate and rhythm. No gallop. GI: soft, nontender, not distended, no mass or organomegaly. Some bowel sounds.  Musculoskeletal/ Extremities: left LE without pitting edema, cords, tenderness. Trace pedal edemaon right. Induration right groin. Neuro: seated balance ok, speech fluent, no facial droop. Moves all extreities. Skin dry, without rash. Ecchymosis at sites of lovenox injections. Superficial ulcerations and nodules mons and vulva, ulcerations up to 1-2 cm. Portacath-without erythema or tenderness  Lab Results:  Results for orders placed or performed in visit on 03/29/15  CBC with Differential  Result Value Ref Range   WBC 29.3 (H) 3.9 - 10.3 10e3/uL   NEUT# 26.3 (H) 1.5 - 6.5 10e3/uL    HGB 11.3 (L) 11.6 - 15.9 g/dL   HCT 36.6 34.8 - 46.6 %   Platelets 405 (H) 145 - 400 10e3/uL   MCV 90.8 79.5 - 101.0 fL   MCH 28.0 25.1 - 34.0 pg   MCHC 30.9 (L) 31.5 - 36.0 g/dL   RBC 4.03 3.70 - 5.45 10e6/uL   RDW 17.2 (H) 11.2 - 14.5 %   lymph# 1.3 0.9 - 3.3 10e3/uL   MONO# 1.6 (H) 0.1 - 0.9 10e3/uL   Eosinophils Absolute 0.0 0.0 - 0.5 10e3/uL   Basophils Absolute 0.0 0.0 - 0.1 10e3/uL   NEUT% 89.9 (H) 38.4 - 76.8 %   LYMPH% 4.5 (L) 14.0 - 49.7 %   MONO% 5.5 0.0 - 14.0 %   EOS% 0.0 0.0 - 7.0 %   BASO% 0.1 0.0 - 2.0 %  Comprehensive metabolic panel (Cmet) - [facility]  Result Value Ref Range   Sodium 138 136 - 145 mEq/L   Potassium 4.8 3.5 - 5.1 mEq/L   Chloride 102 98 - 109 mEq/L   CO2 22 22 - 29 mEq/L   Glucose 107 70 - 140 mg/dl   BUN 25.6 7.0 - 26.0 mg/dL   Creatinine 0.8 0.6 - 1.1 mg/dL   Total Bilirubin <0.30 0.20 - 1.20 mg/dL   Alkaline Phosphatase 405 (H) 40 - 150 U/L   AST 46 (H) 5 - 34 U/L  ALT 54 0 - 55 U/L   Total Protein 6.8 6.4 - 8.3 g/dL   Albumin 3.3 (L) 3.5 - 5.0 g/dL   Calcium 10.6 (H) 8.4 - 10.4 mg/dL   Anion Gap 13 (H) 3 - 11 mEq/L   EGFR 74 (L) >90 ml/min/1.73 m2  WBC elevated on steroids   Note did not draw INR today as decision to change to coumadin was made after lab draw. INR in Sept not elevated. Will begin coumadin at low dose today and 11-15, then check INR when she comes to radiation oncology visit on 11-16.  Studies/Results: CT HEAD WITHOUT AND WITH CONTRAST  TECHNIQUE: Contiguous axial images were obtained from the base of the skull through the vertex without and with intravenous contrast  CONTRAST: 46m OMNIPAQUE IOHEXOL 300 MG/ML SOLN  COMPARISON: Head CT without and with contrast 04/17/2014.  FINDINGS: Visualized paranasal sinuses and mastoids are clear. Visualized orbit soft tissues are within normal limits. Negative scalp soft tissues; presumed sebaceous cyst along the anterior left convexity on series 3, image  23.  No acute or suspicious osseous lesion.  New rim enhancing and patchy enhancing metastases in the brain. Six individual lesions are suspected ranging from patchy and indistinct (series 4, image 19) 2 rim enhancing and circumscribed. The largest lesion is 27 mm in the left cerebellar hemisphere (series 4, image 10), but note also a 15 mm rim enhancing lesion in the dorsal midbrain on the right (image 11). Surrounding edema at each site, mild except in the left cerebellum. Still, there is no significant posterior fossa mass effect. The ventricles remain patent. No midline shift.  No superimposed acute intracranial hemorrhage identified. No superimposed cortically based acute infarct identified. Major intracranial vascular structures are enhancing.  IMPRESSION: 1. 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 2. Associated edema but no significant mass effect at this time. No ventriculomegaly or hemorrhage.  Medications: I have reviewed the patient's current medications. Coumadin 5 mg today and 11-15, then dose by INR on 03-31-15.   DISCUSSION We have discussed rationale for brain radiation, which would be to improve symptoms; alternative would be to continue steroids alone. Daughter wonders if going thru radiation would really benefit in overall situation. With extent of other disease, I would expect whole brain would be best option if radiation is elected. I have told them that likely patient will die within a few weeks if no other intervention for the brain mets.  She will increase hydrocodone to 1 every 4-6 hrs prn pain. She will continue bowel regimen. [place] be helpful. [facility] tho [facility] now may depend  on radiation plans. Patient does not want to continue lovenox injections. I would still prefer careful anticoagulation for now, as she will be much more symptomatic if progression of clot in RLE. If she does radiation, we will be able to watch INR easily during that time. It would be appropriate to stop anticoagulation if bleeding, further decline or actively dying. As request to stop lovenox was after lab, I will start coumadin at low dose today and 11-15, then get INR on 11-16 coordinating with Dr SPearlie Oysterappointment. She would optimally continue lovenox thru end of this week.  [facility]  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 Symptoms from brain mets some better on decadron since 03-26-15. Consultation with Dr Isidore Moos for possible brain radiation 03-31-15. From conversation today, they may prefer not to do this. 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 Progressing locally despite treatment to date. 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 Post SBRT to dominant LLL mass. Extensive and progressing bilateral lung mets despite treatments. Now strictly palliative care, with Hospice referral 3.RLE DVT extensive, found with venous doppler 02-05-15. Patient has asked if she can change to oral anticoagulant as she dislikes lovenox injections. No intracranial hemorrahge with the brain mets by CT. Will convert to coumadin and DC lovenox by end of week if therapeutic. WIll need to watch INR closely; would stop anticoagulation  when terminal phases or if bleeding. 4.History of severe dermatitis and eczema since childhood, with severe flare during nivolumab treatment.  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 With other skin texture changes on nivolumab, possibly this thickening is related to radiation + nivolumab.  9. Advance Directives in place. Patient and daughter have good understanding of situation 10. GERD controlled with protonix daily  11.. flu vaccine given 02-12-15 14.erythema at gum not tender: biotene and salt water rinses  All questions answered. Have made Dr Isidore Moos aware of situation and discussed with [facility] by phone. Time spent 25 min including >50% counseling and coordination of care. CC Drs Denman George, Isidore Moos, Luk[place], [facility]    Gordy Levan, MD   03/29/2015, 9:12 PM

## 2015-03-30 ENCOUNTER — Encounter: Payer: Self-pay | Admitting: Oncology

## 2015-03-30 DIAGNOSIS — C7931 Secondary malignant neoplasm of brain: Secondary | ICD-10-CM | POA: Insufficient documentation

## 2015-03-30 DIAGNOSIS — C519 Malignant neoplasm of vulva, unspecified: Secondary | ICD-10-CM | POA: Insufficient documentation

## 2015-03-30 NOTE — Progress Notes (Signed)
I placed fmal form for julie on desk of nurse for dr. Marko Plume

## 2015-03-31 ENCOUNTER — Encounter: Payer: Self-pay | Admitting: Radiation Oncology

## 2015-03-31 ENCOUNTER — Telehealth: Payer: Self-pay

## 2015-03-31 ENCOUNTER — Other Ambulatory Visit (HOSPITAL_BASED_OUTPATIENT_CLINIC_OR_DEPARTMENT_OTHER): Payer: 59

## 2015-03-31 ENCOUNTER — Ambulatory Visit
Admission: RE | Admit: 2015-03-31 | Discharge: 2015-03-31 | Disposition: A | Discharge status: Home / Self Care | Payer: 59 | Source: Ambulatory Visit | Attending: Radiation Oncology | Admitting: Radiation Oncology

## 2015-03-31 VITALS — BP 117/73 | HR 120 | Temp 97.8°F | Ht 61.0 in | Wt 104.3 lb

## 2015-03-31 DIAGNOSIS — C792 Secondary malignant neoplasm of skin: Secondary | ICD-10-CM | POA: Diagnosis not present

## 2015-03-31 DIAGNOSIS — I82401 Acute embolism and thrombosis of unspecified deep veins of right lower extremity: Secondary | ICD-10-CM

## 2015-03-31 DIAGNOSIS — C3432 Malignant neoplasm of lower lobe, left bronchus or lung: Secondary | ICD-10-CM

## 2015-03-31 DIAGNOSIS — I82511 Chronic embolism and thrombosis of right femoral vein: Secondary | ICD-10-CM

## 2015-03-31 DIAGNOSIS — C3492 Malignant neoplasm of unspecified part of left bronchus or lung: Secondary | ICD-10-CM

## 2015-03-31 DIAGNOSIS — C541 Malignant neoplasm of endometrium: Secondary | ICD-10-CM | POA: Diagnosis not present

## 2015-03-31 DIAGNOSIS — Z510 Encounter for antineoplastic radiation therapy: Secondary | ICD-10-CM | POA: Diagnosis not present

## 2015-03-31 DIAGNOSIS — C7931 Secondary malignant neoplasm of brain: Secondary | ICD-10-CM

## 2015-03-31 DIAGNOSIS — C778 Secondary and unspecified malignant neoplasm of lymph nodes of multiple regions: Secondary | ICD-10-CM | POA: Diagnosis not present

## 2015-03-31 DIAGNOSIS — C519 Malignant neoplasm of vulva, unspecified: Secondary | ICD-10-CM

## 2015-03-31 DIAGNOSIS — C799 Secondary malignant neoplasm of unspecified site: Secondary | ICD-10-CM

## 2015-03-31 DIAGNOSIS — I82411 Acute embolism and thrombosis of right femoral vein: Secondary | ICD-10-CM

## 2015-03-31 DIAGNOSIS — C3431 Malignant neoplasm of lower lobe, right bronchus or lung: Secondary | ICD-10-CM

## 2015-03-31 DIAGNOSIS — IMO0002 Reserved for concepts with insufficient information to code with codable children: Secondary | ICD-10-CM

## 2015-03-31 DIAGNOSIS — C7949 Secondary malignant neoplasm of other parts of nervous system: Principal | ICD-10-CM

## 2015-03-31 LAB — CBC WITH DIFFERENTIAL/PLATELET
BASO%: 0.2 % (ref 0.0–2.0)
BASOS ABS: 0.1 10*3/uL (ref 0.0–0.1)
EOS ABS: 0 10*3/uL (ref 0.0–0.5)
EOS%: 0.1 % (ref 0.0–7.0)
HCT: 37.4 % (ref 34.8–46.6)
HGB: 11.5 g/dL — ABNORMAL LOW (ref 11.6–15.9)
LYMPH%: 5.2 % — AB (ref 14.0–49.7)
MCH: 28.1 pg (ref 25.1–34.0)
MCHC: 30.7 g/dL — AB (ref 31.5–36.0)
MCV: 91.4 fL (ref 79.5–101.0)
MONO#: 1.2 10*3/uL — AB (ref 0.1–0.9)
MONO%: 4.4 % (ref 0.0–14.0)
NEUT#: 25.1 10*3/uL — ABNORMAL HIGH (ref 1.5–6.5)
NEUT%: 90.1 % — AB (ref 38.4–76.8)
PLATELETS: 363 10*3/uL (ref 145–400)
RBC: 4.09 10*6/uL (ref 3.70–5.45)
RDW: 17.5 % — ABNORMAL HIGH (ref 11.2–14.5)
WBC: 27.9 10*3/uL — ABNORMAL HIGH (ref 3.9–10.3)
lymph#: 1.5 10*3/uL (ref 0.9–3.3)

## 2015-03-31 LAB — COMPREHENSIVE METABOLIC PANEL (CC13)
ALT: 99 U/L — ABNORMAL HIGH (ref 0–55)
ANION GAP: 15 meq/L — AB (ref 3–11)
AST: 67 U/L — ABNORMAL HIGH (ref 5–34)
Albumin: 3.2 g/dL — ABNORMAL LOW (ref 3.5–5.0)
Alkaline Phosphatase: 468 U/L — ABNORMAL HIGH (ref 40–150)
BILIRUBIN TOTAL: 0.31 mg/dL (ref 0.20–1.20)
BUN: 20 mg/dL (ref 7.0–26.0)
CALCIUM: 10 mg/dL (ref 8.4–10.4)
CO2: 21 meq/L — AB (ref 22–29)
Chloride: 99 mEq/L (ref 98–109)
Creatinine: 0.8 mg/dL (ref 0.6–1.1)
EGFR: 75 mL/min/{1.73_m2} — AB (ref >90)
Glucose: 134 mg/dl (ref 70–140)
Potassium: 4.5 mEq/L (ref 3.5–5.1)
Sodium: 135 mEq/L — ABNORMAL LOW (ref 136–145)
TOTAL PROTEIN: 6.7 g/dL (ref 6.4–8.3)

## 2015-03-31 LAB — PROTIME-INR
INR: 1.4 — AB (ref 2.00–3.50)
PROTIME: 16.8 s — AB (ref 10.6–13.4)

## 2015-03-31 NOTE — Progress Notes (Addendum)
  Radiation Oncology         [phone]) [phone] ________________________________  Name: Jody Beard MRN: [id number]  Date: 03/31/2015  DOB: 1950/11/15  SIMULATION AND TREATMENT PLANNING NOTE and SPEC TX PROCEDURE NOTE  Outpatient  DIAGNOSIS:     ICD-9-CM ICD-10-CM   1. Primary vulvar cancer ([place]) 184.4 C51.9     NARRATIVE:  The patient was brought to the [place].  Identity was confirmed.  All relevant records and images related to the planned course of therapy were reviewed.  The patient freely provided informed written consent to proceed with treatment after reviewing the details related to the planned course of therapy. The consent form was witnessed and verified by the simulation staff.    Then, the patient was set-up in a stable reproducible  supine position for radiation therapy - I placed adhesive wiring around her vulvar/groin disease, and her legs in a vaclock. Custom bolus was shaped and laid on her skin.  CT images were obtained.  Surface markings were placed.  The CT images were loaded into the planning software.    TREATMENT PLANNING NOTE: Treatment planning then occurred.  The radiation prescription was entered and confirmed.    A total of 3 medically necessary complex treatment devices were fabricated and supervised by me, in the form of 2 fields with MLCs to block bladder, bowel, and rectum, as well as the vaclock.   MORE FIELDS WITH MLCs MAY BE ADDED IN DOSIMETRY for dose homogeneity.  I have requested : 3D Simulation  I have requested a DVH of the following structures: bladder, bowel, rectum.    The patient will receive 10 Gy in 5 ractions.  Special Treatment Procedure Note: The patient received prior radiotherapy close to her current fields. There will  be some overlap of radiation dose.  Prior regional radiotherapy increases the risk of side effects from treatment. I have considered this in the treatment planning process.  This increases the complexity of  this patient's treatment and therefore this constitutes a special treatment procedure.  -----------------------------------  [name], MD   This document serves as a record of services personally performed by [name], MD. It was created on her behalf by Arlyce Harman, a trained medical scribe. The creation of this record is based on the scribe's personal observations and the provider's statements to them. This document has been checked and approved by the attending provider.

## 2015-03-31 NOTE — Telephone Encounter (Signed)
LM for daughter Almyra Free stating that Ms. Riches INR was 1.4 today.  Dr. Marko Plume wants Ms. Khun to take 7.5 mg of Coumadin this evening which is 1-1/2 of the 5 mg tablets.  Then take 5 mg in the evening on 04-01-15 until PT/INR repeated on 04-05-15.  She is to continue Lovenox daily through 04-02-15 dose then discontinue. She will also need a PT/INR on 04-12-15. The PT/INR lab appointments have to be no later then 4 pm as our lab closes then.  Her radiation treatments those days are later then this but trying to have all appointments in the same time frame  Due to difficulty with transportation.  She will need to [facility] at 3:45 pm on 11-21 and 11-28 16 for  4 pm lab.  Appointments for follow up with Medical Oncology are not scheduled as of yet.  Ms. Maqueda could be seen by Marshell Garfinkel on 04-12-15 if needed per Dr. Marko Plume.

## 2015-03-31 NOTE — Telephone Encounter (Signed)
-----   Message from Gordy Levan, MD sent at 03/29/2015  9:58 AM EST ----- Coumadin 5 mg    She will start 1 today and 1 on 11-15, then check INR on 11-16. Will stop lovenox after 11-18 if therapeutic  NEED TO CHECK RESULT 11-15 and be sure Lennis knows  thanks

## 2015-03-31 NOTE — Telephone Encounter (Signed)
Reviewed information noted below regarding coumadin and lovenox dosing and repeat labs with daughter Jody Beard.  Mother is not remembering information or directions correctly. Jody Beard stated that she called in Hospice of 's services today.  Her mother is going to begin staying with her in [place] beginning 04-05-15.  Her mother wanted to stay at home through the weekend. Jody Beard is meeting with hospice tomorrow at 1000 at her home in [place]. Told her that if needed that Hospice could draw the PT/INR after radiation is completed.  [name] does not need to come in and see Dr. Marko Plume.  Care can be coordinatede through this office  And hospice nurses. Daughter verbalized understanding.

## 2015-03-31 NOTE — Progress Notes (Signed)
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 Indication for treatment:  Curative, with concurrent  cisplatin chemotherapy    Radiation treatment dates:  03/09/2014-04/23/2014 Site/dose:   1) vulva, vaginal, and pelvic/inguinal nodes / 60 Gy in 30 fractions to gross disease, 54 Gy in 30 fractions to high risk areas, 50.4Gy in 30 fractions to intermediate risk areas 2) Vulvar, vaginal, and nodal boost / 6 Gy in 3 fractions to gross disease  DIAGNOSIS: 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 Her main issue today is that she has new brain metastases. She presented recently with confusion and loss of balance. A CT of the head with and without contrast was performed on 03/26/15. I've looked at these images, the imaging reveals at least 6 metastases in the brain. The largest is a left cerebellar tumor that is 27 mm. There is no significant mass effect and no ventriculomegaly. She denies seizures, headaches, nausea, visual changes, trouble breathing, and coordination issues. She reports weakness in her legs and memory issues and ataxia. She is on 4 mg decadron every 8 hours. Symptoms improved with dexamethasone.   She reports pain in her right groin continues. Her daughter notes that this pain keeps her from walking.   She reports the mass in her mouth  improves with Biotine use. She reports the mass on her right upper back is non-painful. She feels stronger on her right side (arm) than her left.   She denies vaginal bleeding but does have a tissue in her underwear with scant blood on it. I suspect she has at the very least some bleeding from her vulvar disease, hopefully this will improve with radiotherapy. She has three young grandchildren at her daughter's home. She is strongly considering hospice soon.     ALLERGIES:  is allergic to oxycontin; other; and penicillins.  Meds: Current Outpatient Prescriptions  Medication Sig Dispense Refill  . acetaminophen (TYLENOL) 325 MG tablet Take 325 mg by mouth at bedtime as needed.    Marland Kitchen albuterol (PROVENTIL HFA;VENTOLIN HFA) 108 (90 BASE) MCG/ACT inhaler Inhale 1-2 puffs into the lungs every 6 (six) hours as needed for wheezing or shortness of breath. 1 Inhaler 2  . cyclobenzaprine (FLEXERIL) 10 MG tablet 1/2 to 1 tablet every 8-12 hours as needed for muscle spasm. (Patient not taking: Reported on 12/31/2014) 10 tablet 0  . dexamethasone (DECADRON) 4 MG tablet Take 1 tablet (4 mg total) by mouth every 8 (eight) hours. With food 40 tablet 0  . diphenhydrAMINE (BENADRYL) 25 mg capsule Take 25 mg by mouth every 6 (six) hours as needed for allergies or sleep.     Marland Kitchen  enoxaparin (LOVENOX) 80 MG/0.8ML injection Inject 0.8 mLs (80 mg total) into the skin daily. 30 Syringe 2  . FERRETTS 325 (106 FE) MG TABS tablet TAKE 1 TABLET BY MOUTH ON AN EMPTY STOMACH WITH ORANGE JUICE (Patient not taking: Reported on 01/28/2015) 30 tablet 5  . HYDROcodone-acetaminophen (NORCO/VICODIN) 5-325 MG tablet Take 1 tablet every 4-6 hours as needed for pain 60 tablet 0  . hydrOXYzine (VISTARIL) 25 MG capsule Take 25-50 mg by mouth at bedtime as needed for itching.    . lidocaine (XYLOCAINE) 2 % solution Mix as directed and apply 3-4 times daily to affected areas. 100 mL 3  . lidocaine-prilocaine (EMLA) cream Apply to Porta-Cath site  1-2 hours prior to access as directed. 30 g 1  . Melatonin 10 MG TBDP Take 10 mg by mouth at bedtime.    . mupirocin ointment (BACTROBAN) 2 % Apply 1 application topically 3 (three) times daily.  3  . phenylephrine (SUDAFED PE) 10 MG TABS tablet Take 10 mg by mouth at bedtime as needed.    . polyethylene glycol (MIRALAX / GLYCOLAX) packet Take 17 g by mouth 2 (two) times daily.    . silver sulfADIAZINE (SILVADENE) 1 % cream Apply 1 application topically daily. 50 g 0  . triamcinolone cream (KENALOG) 0.1 % Apply 1 application topically 2 (two) times daily.  3  . warfarin (COUMADIN) 5 MG tablet Take 1 tablet daily or as directed. 20 tablet 0   No current facility-administered medications for this encounter.    Physical Findings: The patient is in no acute distress. Patient is alert and oriented.  height is '5\' 1"'$  (1.549 m) and weight is 104 lb 4.8 oz (47.31 kg). Her temperature is 97.8 F (36.6 C). Her blood pressure is 117/73 and her pulse is 120.    GENERAL: Thin. Presents in wheelchair.  HEENT: Extraocular movements are intact, no nystagmus. MOUTH: Exophytic mass over the left upper gingiva about 8 mm in greatest dimension. No thrush in her oral cavity. NECK: Neck without any cervical or supraclavicular masses. HEART: Heart is tachycardic with no murmurs and rhythm is normal. LUNGS: Chest is clear on auscultation. SKIN: Firm nodule underneath her skin in the area of the right trapezius about 2 cm in dimension.  MUSCULOSKELETAL: No edema in her ankles. NEUROLOGICAL: Slightly decreased strength in her left arm. 4/5 strength in her hip flexion bilaterally GYN: She has some ulcerative subcutaneous lesions over the right groin with surrounding induration and erythema involving the groin and the vulva- very suspicious for recurrence. Complete GYN exam was not performed today.   Lab Findings: Lab Results  Component Value Date   WBC 27.9* 03/31/2015   HGB 11.5* 03/31/2015   HCT 37.4  03/31/2015   MCV 91.4 03/31/2015   PLT 363 03/31/2015    Radiographic Findings: Dg Chest 2 View  03/22/2015  CLINICAL DATA:  Follow-up of known metastatic lung malignancy, no current chest complaints EXAM: CHEST  2 VIEW COMPARISON:  PA and lateral chest x-ray of January 26, 2015 FINDINGS: There has been interval increase in the size and number of the multiple pulmonary parenchymal nodules bilaterally. The largest nodule lies in the right lower lobe posteriorly and measures approximately 4.7 cm transversely. Previously it had measured approximately 4 cm transversely. There is stable tenting of the left hemidiaphragm. There is no pleural effusion or pneumothorax. The heart and pulmonary vascularity are normal. The power port appliance tip projects over the midportion of the SVC. The bony thorax exhibits  no acute abnormality. IMPRESSION: Marked interval progression of widespread pulmonary metastatic disease. Electronically Signed   By: David  Martinique M.D.   On: 03/22/2015 16:14   Ct Head W Wo Contrast  03/26/2015  CLINICAL DATA:  64 year old female with current history of vulvar cancer and lung squamous cell carcinoma which is thought based on pathology to be primary rather than metastatic. Confusion and loss of balance. Subsequent encounter. EXAM: CT HEAD WITHOUT AND WITH CONTRAST TECHNIQUE: Contiguous axial images were obtained from the base of the skull through the vertex without and with intravenous contrast CONTRAST:  24m OMNIPAQUE IOHEXOL 300 MG/ML  SOLN COMPARISON:  Head CT without and with contrast 04/17/2014. FINDINGS: Visualized paranasal sinuses and mastoids are clear. Visualized orbit soft tissues are within normal limits. Negative scalp soft tissues; presumed sebaceous cyst along the anterior left convexity on series 3, image 23. No acute or suspicious osseous lesion. New rim enhancing and patchy enhancing metastases in the brain. Six individual lesions are suspected ranging from patchy and  indistinct (series 4, image 19) 2 rim enhancing and circumscribed. The largest lesion is 27 mm in the left cerebellar hemisphere (series 4, image 10), but note also a 15 mm rim enhancing lesion in the dorsal midbrain on the right (image 11). Surrounding edema at each site, mild except in the left cerebellum. Still, there is no significant posterior fossa mass effect. The ventricles remain patent. No midline shift. No superimposed acute intracranial hemorrhage identified. No superimposed cortically based acute infarct identified. Major intracranial vascular structures are enhancing. IMPRESSION: 1. 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 2. Associated edema but no significant mass effect at this time. No ventriculomegaly or hemorrhage. These results will be called to the ordering clinician or representative by the Radiologist Assistant, and communication documented in the PACS or zVision Dashboard. Electronically Signed   By: HGenevie AnnM.D.   On: 03/26/2015 11:16    Impression/Plan:  The patient is a good candidate for palliative re-irradiation of her groin for pain management. We will give two rather than the standard ten palliative fractions of radiotherapy to the right groin and vulvar region as coming in for multiple treatments would be an enormous burden for the patient and her family.  Consent signed today.    The patient and her family after discussing with me the risks, benefits, and potential side effects of whole brain radiotherapy have decided to proceed with supportive care and dexamethasone instead.  She will continue with Simulation and Treatment planning appointment this afternoon.  Social work called (spoke with LPolo Riley to address hospice questions from family today. _____________________________________   SEppie Gibson  MD    This document serves as a record of services personally performed by SEppie Gibson MD. It was created on her behalf by EArlyce Harman a trained medical scribe. The creation of this record is based on the scribe's personal observations and the provider's statements to them. This document has been checked and approved by the attending provider.

## 2015-04-01 ENCOUNTER — Ambulatory Visit: Payer: 59

## 2015-04-01 ENCOUNTER — Encounter: Payer: 59 | Admitting: Nutrition

## 2015-04-02 ENCOUNTER — Other Ambulatory Visit: Payer: Self-pay | Admitting: Oncology

## 2015-04-02 DIAGNOSIS — Z510 Encounter for antineoplastic radiation therapy: Secondary | ICD-10-CM | POA: Diagnosis not present

## 2015-04-03 ENCOUNTER — Ambulatory Visit: Payer: 59

## 2015-04-05 ENCOUNTER — Telehealth: Payer: Self-pay | Admitting: Oncology

## 2015-04-05 ENCOUNTER — Other Ambulatory Visit (HOSPITAL_BASED_OUTPATIENT_CLINIC_OR_DEPARTMENT_OTHER): Payer: 59

## 2015-04-05 ENCOUNTER — Encounter: Payer: Self-pay | Admitting: Radiation Oncology

## 2015-04-05 ENCOUNTER — Ambulatory Visit
Admission: RE | Admit: 2015-04-05 | Discharge: 2015-04-05 | Disposition: A | Discharge status: Home / Self Care | Payer: 59 | Source: Ambulatory Visit | Attending: Radiation Oncology | Admitting: Radiation Oncology

## 2015-04-05 ENCOUNTER — Other Ambulatory Visit: Payer: Self-pay | Admitting: Oncology

## 2015-04-05 ENCOUNTER — Other Ambulatory Visit (HOSPITAL_COMMUNITY)
Admission: AD | Admit: 2015-04-05 | Discharge: 2015-04-05 | Disposition: A | Discharge status: Home / Self Care | Payer: 59 | Source: Ambulatory Visit | Attending: Oncology | Admitting: Oncology

## 2015-04-05 VITALS — BP 117/77 | HR 104 | Temp 97.6°F | Resp 18 | Ht 61.0 in | Wt 98.2 lb

## 2015-04-05 DIAGNOSIS — C799 Secondary malignant neoplasm of unspecified site: Secondary | ICD-10-CM

## 2015-04-05 DIAGNOSIS — C519 Malignant neoplasm of vulva, unspecified: Secondary | ICD-10-CM

## 2015-04-05 DIAGNOSIS — C3492 Malignant neoplasm of unspecified part of left bronchus or lung: Secondary | ICD-10-CM

## 2015-04-05 DIAGNOSIS — C541 Malignant neoplasm of endometrium: Secondary | ICD-10-CM

## 2015-04-05 DIAGNOSIS — Z510 Encounter for antineoplastic radiation therapy: Secondary | ICD-10-CM | POA: Diagnosis not present

## 2015-04-05 DIAGNOSIS — I82401 Acute embolism and thrombosis of unspecified deep veins of right lower extremity: Secondary | ICD-10-CM

## 2015-04-05 DIAGNOSIS — IMO0002 Reserved for concepts with insufficient information to code with codable children: Secondary | ICD-10-CM

## 2015-04-05 LAB — CBC WITH DIFFERENTIAL/PLATELET
BASO%: 0.3 % (ref 0.0–2.0)
Basophils Absolute: 0.1 10*3/uL (ref 0.0–0.1)
EOS ABS: 0 10*3/uL (ref 0.0–0.5)
EOS%: 0 % (ref 0.0–7.0)
HCT: 38.5 % (ref 34.8–46.6)
HEMOGLOBIN: 11.9 g/dL (ref 11.6–15.9)
LYMPH%: 2.8 % — ABNORMAL LOW (ref 14.0–49.7)
MCH: 27.3 pg (ref 25.1–34.0)
MCHC: 31 g/dL — ABNORMAL LOW (ref 31.5–36.0)
MCV: 88.1 fL (ref 79.5–101.0)
MONO#: 1.4 10*3/uL — ABNORMAL HIGH (ref 0.1–0.9)
MONO%: 4.2 % (ref 0.0–14.0)
NEUT%: 92.7 % — ABNORMAL HIGH (ref 38.4–76.8)
NEUTROS ABS: 31 10*3/uL — AB (ref 1.5–6.5)
Platelets: 354 10*3/uL (ref 145–400)
RBC: 4.37 10*6/uL (ref 3.70–5.45)
RDW: 18.5 % — AB (ref 11.2–14.5)
WBC: 33.4 10*3/uL — AB (ref 3.9–10.3)
lymph#: 0.9 10*3/uL (ref 0.9–3.3)

## 2015-04-05 LAB — PROTIME-INR: Prothrombin Time: 76.4 seconds — ABNORMAL HIGH (ref 11.6–15.2)

## 2015-04-05 LAB — COMPREHENSIVE METABOLIC PANEL (CC13)
ALBUMIN: 3.1 g/dL — AB (ref 3.5–5.0)
ALT: 102 U/L — AB (ref 0–55)
ANION GAP: 15 meq/L — AB (ref 3–11)
AST: 75 U/L — AB (ref 5–34)
Alkaline Phosphatase: 726 U/L — ABNORMAL HIGH (ref 40–150)
BUN: 30.9 mg/dL — AB (ref 7.0–26.0)
CALCIUM: 10.1 mg/dL (ref 8.4–10.4)
CHLORIDE: 102 meq/L (ref 98–109)
CO2: 19 mEq/L — ABNORMAL LOW (ref 22–29)
CREATININE: 0.8 mg/dL (ref 0.6–1.1)
EGFR: 74 mL/min/{1.73_m2} — ABNORMAL LOW (ref >90)
Glucose: 125 mg/dl (ref 70–140)
Potassium: 4.6 mEq/L (ref 3.5–5.1)
Sodium: 135 mEq/L — ABNORMAL LOW (ref 136–145)
TOTAL PROTEIN: 6.6 g/dL (ref 6.4–8.3)

## 2015-04-05 NOTE — Progress Notes (Signed)
Jody Beard has completed 1 fraction to her right groin.  She reports having pain in her right groin at a 5/10.  She is taking Vicodin q 4 hours.  She denies bladder and bowel issues.  Her last bm was today.  She reports having vaginal bleeding that started today.    BP 117/77 mmHg  Pulse 104  Temp(Src) 97.6 F (36.4 C) (Oral)  Resp 18  Ht '5\' 1"'$  (1.549 m)  Wt 98 lb 3.2 oz (44.543 kg)  BMI 18.56 kg/m2  SpO2 100%   Wt Readings from Last 3 Encounters:  04/05/15 98 lb 3.2 oz (44.543 kg)  03/31/15 104 lb 4.8 oz (47.31 kg)  03/29/15 104 lb 12.8 oz (47.537 kg)

## 2015-04-05 NOTE — Progress Notes (Signed)
   Weekly Management Note:  Outpatient    ICD-9-CM ICD-10-CM   1. Primary vulvar cancer (HCC) 184.4 C51.9     Current Dose:  5 Gy  Projected Dose: 10 Gy   Narrative:  The patient presents for routine under treatment assessment.  CBCT/MVCT images/Port film x-rays were reviewed.  The chart was checked. No acute effects thus far. She does report vaginal bleeding  Physical Findings:  height is '5\' 1"'$  (1.549 m) and weight is 98 lb 3.2 oz (44.543 kg). Her oral temperature is 97.6 F (36.4 C). Her blood pressure is 117/77 and her pulse is 104. Her respiration is 18 and oxygen saturation is 100%.   Wt Readings from Last 3 Encounters:  04/05/15 98 lb 3.2 oz (44.543 kg)  03/31/15 104 lb 4.8 oz (47.31 kg)  03/29/15 104 lb 12.8 oz (47.537 kg)   NAD In wheelchair  Impression:  The patient is tolerating radiotherapy.  Plan:  Continue radiotherapy as planned. 1 more fraction to be given in 1 week.  ________________________________   [name], M.D.

## 2015-04-05 NOTE — Telephone Encounter (Signed)
Medical Oncology   INR sent out for confirmation earlier today, no results yet.  Called daughter Almyra Free: Hospice visited today, all equipment delivered, patient to move to [employer] house tomorrow. Patient has had no bleeding and leg seems same, first of 2 RT treatments today. Lovenox DCd 04-02-15. Patient progressively weaker: unable to get up from toilet today with Almyra Free assisting. High risk falls.   Told Almyra Free that I think risk of bleeding now outweighs risk of problems related to DVT RLE. Told her to stop coumadin. Almyra Free agrees with this plan and will let her mother know. She will not need INR checked next week.  Almyra Free knows to call if we can help, and hospice assistance appreciated.  L.Rodman Recupero, MD.

## 2015-04-06 NOTE — Telephone Encounter (Signed)
Medical Oncology   INR >10 when resulted after office yesterday, called to MD on call who LM for patient/ husband/ daughter but did not get return call. Prior to result, I had spoken with daughter to tell her to DC coumadin.  Vevelyn Royals on cell now, to let her know that coumadin level was high. Patient did not take coumadin yesterday. Almyra Free is not aware of any bleeding or other problems overnight, expects patient to come to her home soon and knows to call if bleeding.  Hospice scheduled to come to her home next on 11-28; I will also update Hospice re INR and DC of coumadin.  Almyra Free knows that Hospice can also see prior to 11-28 if needed.   I spoke also with nurse manager for [facility] now, to let them know of excessively elevated INR and of DC of coumadin.   Godfrey Pick, MD

## 2015-04-12 ENCOUNTER — Other Ambulatory Visit: Payer: Self-pay | Admitting: *Deleted

## 2015-04-12 ENCOUNTER — Ambulatory Visit: Payer: 59

## 2015-04-12 ENCOUNTER — Ambulatory Visit
Admission: RE | Admit: 2015-04-12 | Discharge: 2015-04-12 | Disposition: A | Discharge status: Home / Self Care | Payer: 59 | Source: Ambulatory Visit | Attending: Radiation Oncology | Admitting: Radiation Oncology

## 2015-04-12 ENCOUNTER — Ambulatory Visit: Payer: 59 | Admitting: Radiation Oncology

## 2015-04-12 ENCOUNTER — Telehealth: Payer: Self-pay | Admitting: *Deleted

## 2015-04-12 ENCOUNTER — Other Ambulatory Visit: Payer: 59

## 2015-04-12 DIAGNOSIS — C7802 Secondary malignant neoplasm of left lung: Secondary | ICD-10-CM

## 2015-04-12 DIAGNOSIS — G893 Neoplasm related pain (acute) (chronic): Secondary | ICD-10-CM

## 2015-04-12 DIAGNOSIS — C7801 Secondary malignant neoplasm of right lung: Secondary | ICD-10-CM

## 2015-04-12 DIAGNOSIS — C519 Malignant neoplasm of vulva, unspecified: Secondary | ICD-10-CM

## 2015-04-12 MED ORDER — FENTANYL 12 MCG/HR TD PT72: 12.5000 ug | MEDICATED_PATCH | TRANSDERMAL | Status: AC

## 2015-04-12 MED ORDER — FENTANYL 25 MCG/HR TD PT72: 25.0000 ug | MEDICATED_PATCH | TRANSDERMAL | Status: DC

## 2015-04-12 NOTE — Progress Notes (Signed)
Received phone call from [place], [facility] RN, requesting additional pain medication for patient. Currently, she is taking Norco 5-325 every 4 hours and it is not controlling her pain. Discussed with Dr. Marko Plume and script for Fentanyl 12.37mg patch faxed to patient's pharmacy. Called and notified LWindy Cannythat this has been done and she is agreeable to see patient either tomorrow or the following day to assess how she is doing with addition of the Fentanyl patch. Called and spoke with patient's daughter, JAlmyra Free and she is agreeable to pick-up patches and go ahead and place one on her mom's arm today. No other concerns noted at this time from [place] Instructed JAlmyra Freeto call our office or LWindy Cannyif she notices any changes in her mother's mental status once starting the Fentanyl patch as she had difficulty tolerating OxyContin in the past. JAlmyra Freeagreeable to this and appreciative of the call.   JAlmyra Freealso requests that her mom's last radiation treatment and appt with Dr. SIsidore Moosbe cancelled as her mom isn't able to get out of the bed today. Called and spoke with rad onc and they will cancel appts for today and followup with patient if needed.

## 2015-04-12 NOTE — Telephone Encounter (Signed)
Voicemail retrieved "Ms. Artus is wanting to use pain medicine every three hours.  Asking for long acting pain medication and leave Hydrocodone acetaminophen as breakthrough pain med."

## 2015-04-13 ENCOUNTER — Telehealth: Payer: Self-pay | Admitting: *Deleted

## 2015-04-13 NOTE — Progress Notes (Signed)
  Radiation Oncology         [phone] ________________________________  Name: Jody Beard MRN: [id number]  Date: 04/05/2015  DOB: [birth year]  End of Treatment Note  Stage IV vulvar cancer, recurrent  Indication for treatment:  palliative       Radiation treatment dates:   04/05/2015  Site/dose:  Right groin and pelvis / 5Gy in 1 fraction (patient was scheduled for a second fraction of 5Gy but declined)  Beams/energy:   3D conformal / 10 and 6X  Narrative: The patient tolerated radiation treatment relatively well  (patient was scheduled for a second fraction of 5Gy but declined this)  Plan:  Ms. Skeet follow up appointment has been cancelled as she unfortunately died less than a month after treatment. -----------------------------------  [name], MD

## 2015-04-13 NOTE — Telephone Encounter (Signed)
Faxed signed physician orders, plan of care, and certification statement for the first 90 days back to [facility]. Original orders sent to [facility] to be scanned into patient's chart.

## 2015-04-13 NOTE — Telephone Encounter (Signed)
Duragesic ordered

## 2015-04-13 NOTE — Telephone Encounter (Signed)
Received phone call from Blanche, patient's hospice nurse, with update on patient since Fentanyl patch was applied yesterday early afternoon. She states she just left the patient's home and she is responding well to the patch. Windy Canny reports that patient is not experiencing any confusion since the patch was added. She states patient reports having a restful nights sleep and that her pain is much better. No additional needs from [place] at this time.

## 2015-04-14 ENCOUNTER — Other Ambulatory Visit: Payer: Self-pay | Admitting: *Deleted

## 2015-04-14 ENCOUNTER — Telehealth: Payer: Self-pay | Admitting: *Deleted

## 2015-04-14 DIAGNOSIS — C519 Malignant neoplasm of vulva, unspecified: Secondary | ICD-10-CM

## 2015-04-14 MED ORDER — LIDOCAINE HCL 2 % EX GEL: CUTANEOUS | Status: AC

## 2015-04-14 NOTE — Telephone Encounter (Signed)
Called and spoke with patient's daughter, Almyra Free, regarding information noted below by Dr. Marko Plume. She states she does not think her mom is impacted as it is a large amount of diarrhea each time. She states her mom has not had any laxatives for the last week. I told Almyra Free that Dr. Marko Plume would like to test for C.Diff - she states "I don't think it is C.Diff because it doesn't smell like that." Told her that we will test for it anyway to just rule out. She gave her mom one dose of imodium prior to me calling but states she will not give her anymore since we will collect stool sample. Called and asked [place], Hospice RN, to drop off stool collection container for patient. She states she will drop it off tomorrow and call our office in the next day or two with a patient update. Dr. Marko Plume notified of this.

## 2015-04-14 NOTE — Telephone Encounter (Signed)
-----   Message from Gordy Levan, MD sent at 04/14/2015 12:25 PM EST ----- Regarding: RE: Diarrhea With all narcotics, I would not expect diarrhea unless it is diarrhea around impacted stool. If she is taking laxatives, would hold one dose of those to see if that resolves the problem, then likely needs to resume laxatives if no diarrhea. I would check stool for C diff. I would prefer these recommendations to using imodium now. If no better off laxatives, no apparent impaction and C diff negative, THEN they could try imodium  I do appreciate Hospice RN asking about this! Jody Beard ----- Message -----    From: Christa See, RN    Sent: 04/14/2015  12:13 PM      To: Jody Beard Marion Downer, MD Subject: Diarrhea                                       Received call from Hospice RN that patient had 2 episodes of diarrhea yesterday and has had it twice today as well. She said per Hospice protocol she instructed patient to take imodium now and then after each loose stool. She wanted to make sure it is okay with you that she takes imodium?  Her pain though is managed much better with the addition of the Fentanyl patch and no noted confusion!  Also, she said she will call back with another update this afternoon before 5pm.

## 2015-04-15 ENCOUNTER — Telehealth: Payer: Self-pay

## 2015-04-15 DIAGNOSIS — C519 Malignant neoplasm of vulva, unspecified: Secondary | ICD-10-CM

## 2015-04-15 DIAGNOSIS — C7802 Secondary malignant neoplasm of left lung: Secondary | ICD-10-CM

## 2015-04-15 DIAGNOSIS — C3432 Malignant neoplasm of lower lobe, left bronchus or lung: Secondary | ICD-10-CM

## 2015-04-15 DIAGNOSIS — C7801 Secondary malignant neoplasm of right lung: Secondary | ICD-10-CM

## 2015-04-15 MED ORDER — HYDROCODONE-ACETAMINOPHEN 5-325 MG PO TABS: ORAL_TABLET | ORAL | Status: AC

## 2015-04-15 NOTE — Telephone Encounter (Signed)
Jody Beard stated that Jody Beard is using 2-4 Norco per day in addition to the 12.5 mcg Duragesic Patch.  Jody stool is more formed today so the wiping of the perineal area is less so patient more comfortable not having to clean perineal area which has open ulcers. Faxed prescription to [facility] in [place].  Noted Hospice patient on prescription. Notified  Jody Beard that the prescription was sent to the pharmacy.  Jody Beard seems more comfortable today.  She is sleeping more today.  Stools more  formed and less frequent today.  Reiterated that that [facility] place is available if Jody Beard has difficulty managing Jody Beard at home.

## 2015-04-16 ENCOUNTER — Ambulatory Visit: Payer: 59 | Admitting: Gynecologic Oncology

## 2015-04-16 ENCOUNTER — Telehealth: Payer: Self-pay

## 2015-04-16 NOTE — Telephone Encounter (Signed)
Jody Canny, RN stated that daughter Jody Beard called this am with Jody Beard in excruciating pain.  Jody Beard placed another 12.5 mcg  fentanyl  patch on and used hydrocodone with out relief.   Jody Beard spoke with Dr. Lyman Speller and he ordered Roxanol liquid 10 mg q 2 hours prn pain.  Will continue with 25 mcg of fentanyl and Roxanol prn.   Told Jody Beard that Dr. Marko Plume stated yesterday that she was concerned that Jody Beard could have a seizure with the multiple brain mets.  It might be good to have liquid valium on hand to use if needed.  Jody Beard will discuss this with Dr. Lyman Speller as Dr. Marko Plume not in the office today.

## 2015-04-19 ENCOUNTER — Telehealth: Payer: Self-pay | Admitting: Oncology

## 2015-04-19 ENCOUNTER — Ambulatory Visit: Payer: 59 | Admitting: Gynecologic Oncology

## 2015-04-19 NOTE — Telephone Encounter (Signed)
Received death certificate 03/0/09

## 2015-04-22 ENCOUNTER — Encounter: Payer: Self-pay | Admitting: Oncology

## 2015-04-22 NOTE — Progress Notes (Signed)
Medical Oncology  Patient died this week, under care of Hospice, from progressive metastatic squamous cell carcinoma vulva and lung. Sympathy letter written to family. Other MDs to be copied on this note, as their care was much appreciated.  Godfrey Pick, MD

## 2015-04-27 ENCOUNTER — Telehealth: Payer: Self-pay | Admitting: Oncology

## 2015-04-27 NOTE — Telephone Encounter (Signed)
[place] to confirm we faxed over death certificate, and mailed to [facility].

## 2015-04-27 NOTE — Addendum Note (Signed)
Encounter addended by: [name], MD on: 04/27/2015  9:58 AM<BR>     Documentation filed: Notes Section

## 2015-04-27 NOTE — Progress Notes (Signed)
04-05-15  Diagnosis: C51.9 Primary vulvar cancer   Custom bolus constitutes an intermediate treatment device which was placed on patient for her treatment today.  -----------------------------------  [name], MD

## 2015-05-16 DEATH — deceased

## 2015-09-15 ENCOUNTER — Encounter: Payer: Self-pay | Admitting: Oncology

## 2015-09-15 NOTE — Progress Notes (Signed)
[facility] faxed 03/2015 I sent to medical records

## 2015-11-01 ENCOUNTER — Other Ambulatory Visit: Payer: Self-pay | Admitting: Nurse Practitioner

## 2016-02-08 IMAGING — CT CT CHEST W/ CM
2 of 3 series · 15 of 36 positions shown, 18 images · IV contrast (OMNIPAQUE)
Comparison: PET-CT dated 02/18/2014

CLINICAL DATA: Vulvar cancer diagnosed [DATE], chemotherapy and
XRT complete, follow-up left lower lobe lung mass (metastases versus
lung cancer)

EXAM:
CT CHEST WITH CONTRAST
TECHNIQUE: Multidetector CT imaging of the chest was performed during
intravenous contrast administration.
CONTRAST:  80mL OMNIPAQUE IOHEXOL 300 MG/ML  SOLN

[Series 2: chest with st · axial · 0.68mm/px · z∈[-308,-28]mm · 12 of 66 slices shown, 15 images]
[im 5/66  mediastinal]
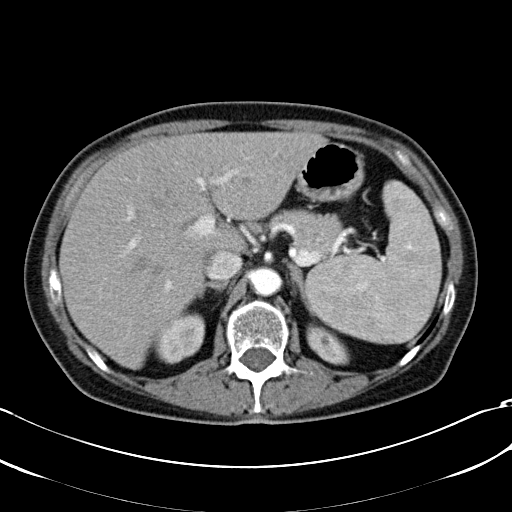
[im 5/66  lung]
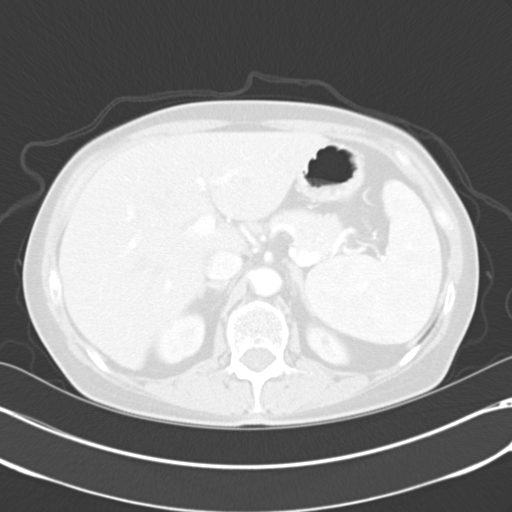
[im 10/66  lung]
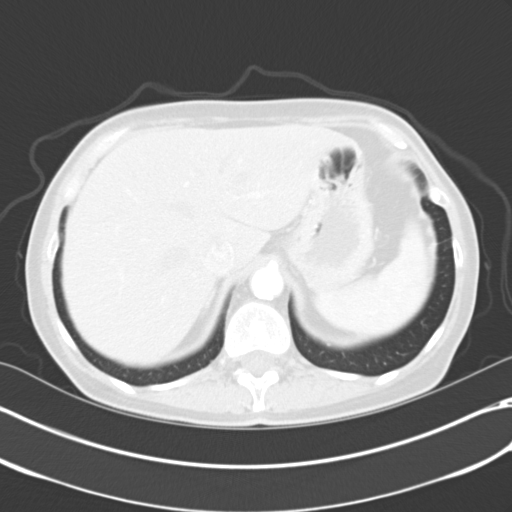
[im 15/66  lung]
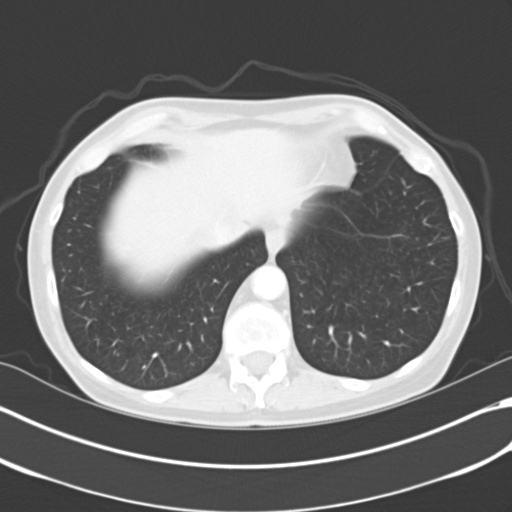
[im 20/66  lung]
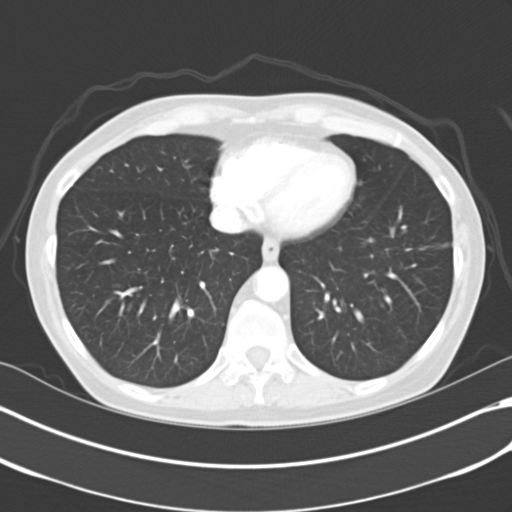
[im 25/66  mediastinal]
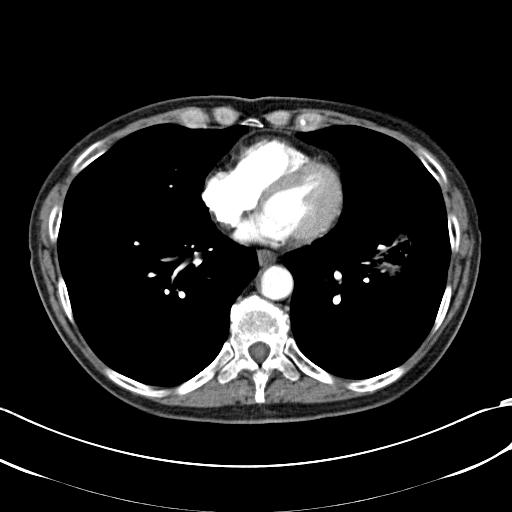
[im 25/66  lung]
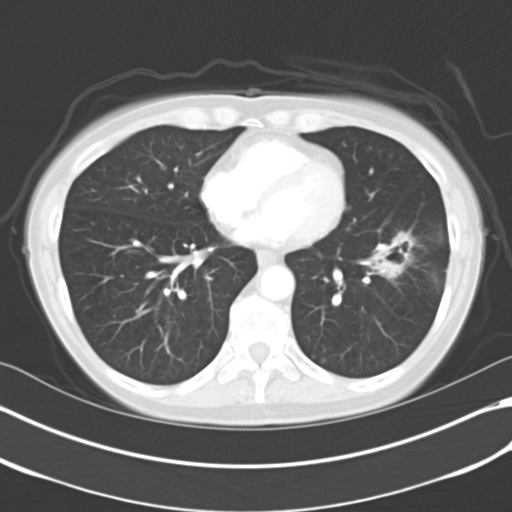
[im 29/66  lung]
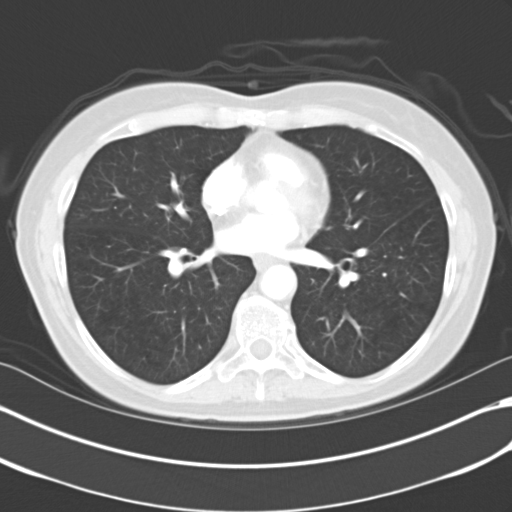
[im 37/66  lung]
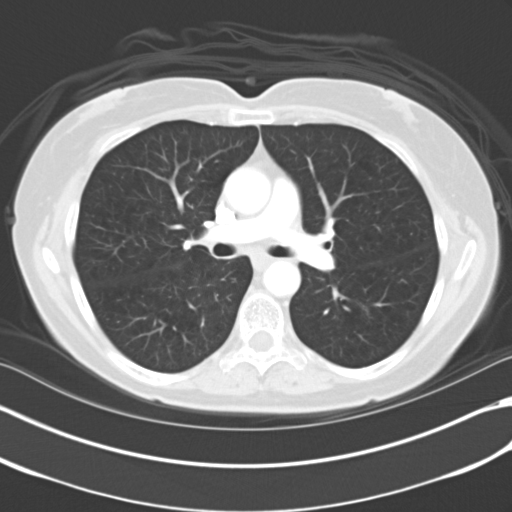
[im 41/66  lung]
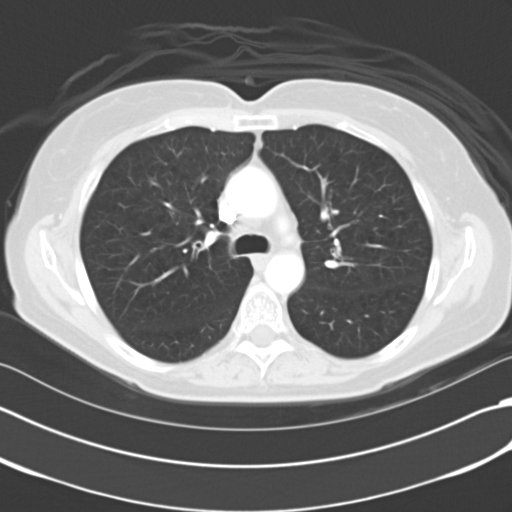
[im 46/66  mediastinal]
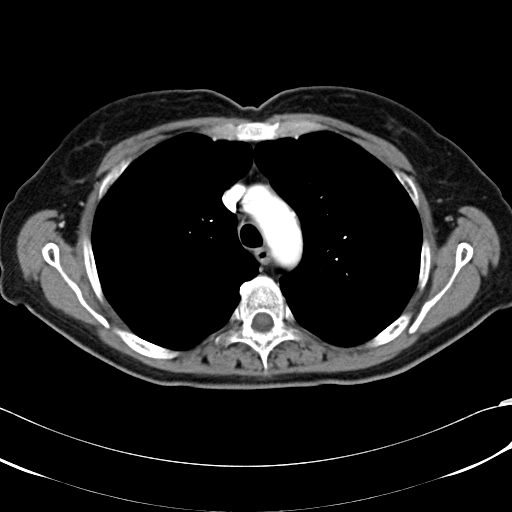
[im 46/66  lung]
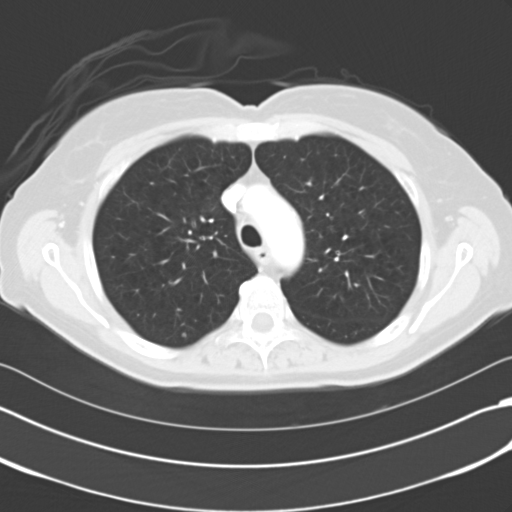
[im 51/66  lung]
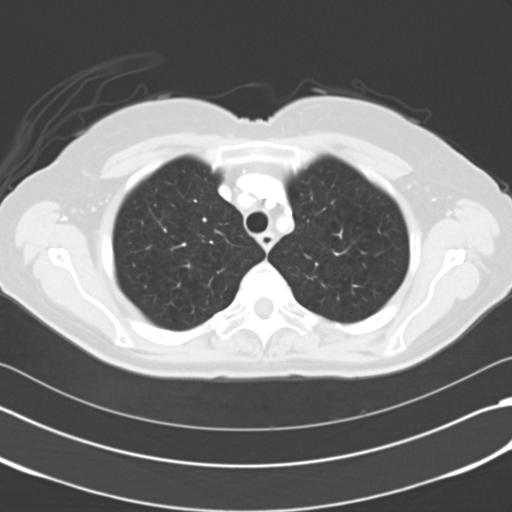
[im 56/66  lung]
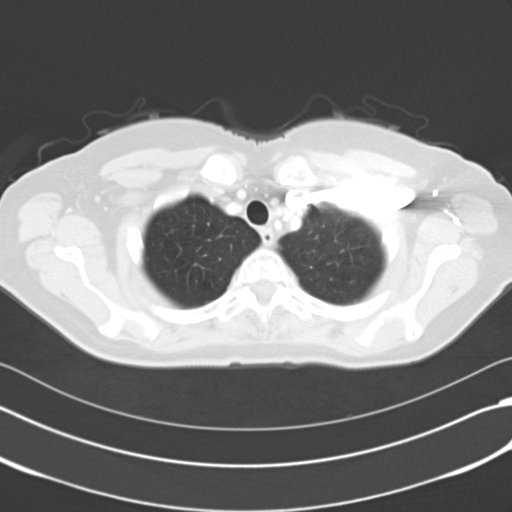
[im 61/66  lung]
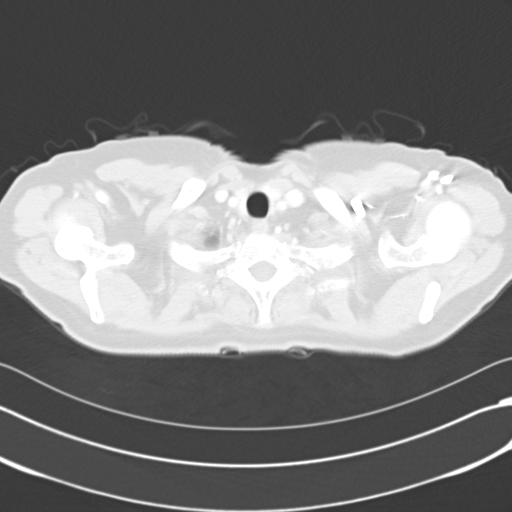

[Series 602: <mpr thick range> · coronal · 0.68mm/px · 3 of 117 slices shown]
[im 24/117  lung]
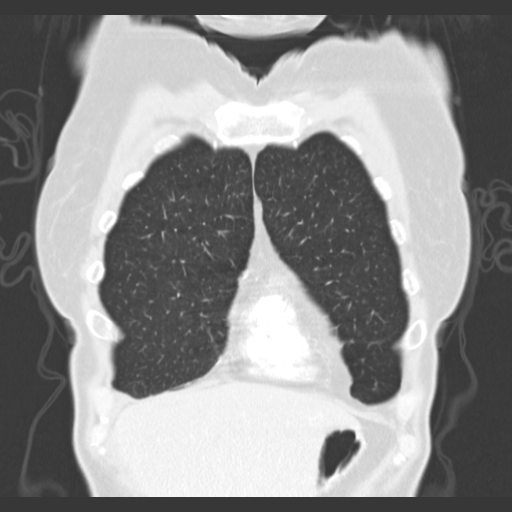
[im 47/117  lung]
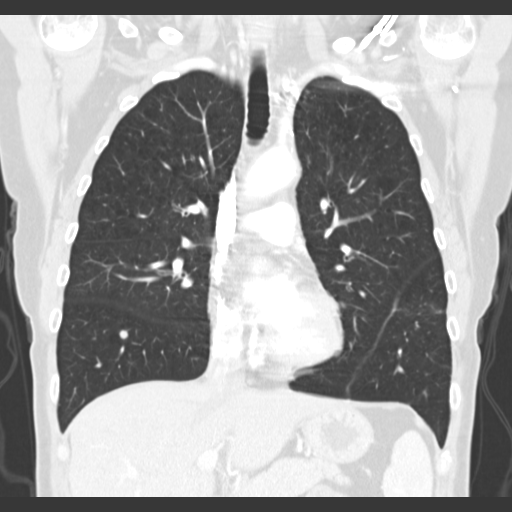
[im 70/117  lung]
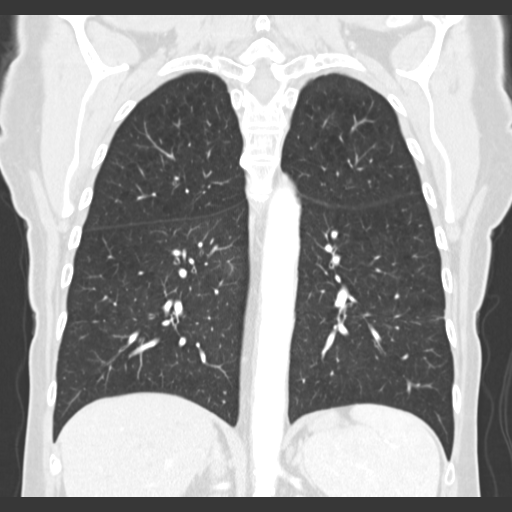

[15 of 36 positions shown; findings below may reference images not displayed]

FINDINGS: 3.3 x 2.3 cm cavitary, spiculated left lower lobe mass (series 5/
image 42), previously 4.6 x 3.9 cm. Surrounding mild ground-glass
opacity.

Two adjacent 3 mm nodules in the left lower lobe (series 5/ image
35), unchanged. 5 mm nodule in the right lower lobe (series 5/ image
44), unchanged. Two 2 mm nodules in the right lower lobe (series
5/image 47), unchanged. 3 mm nodule in the posterior right upper
lobe (series 5/image 21), unchanged.

Mild centrilobular emphysematous changes. No pleural effusion or
pneumothorax.

Visualized thyroid is unremarkable.

The heart is normal in size. No pericardial effusion. Mild
atherosclerotic calcifications of the aortic arch.

No suspicious mediastinal, hilar, or axillary lymphadenopathy.

Visualized upper abdomen is unremarkable.

Mild degenerative changes of the visualized thoracolumbar spine.
IMPRESSION: 3.3 cm cavitary, spiculated left lower lobe mass, decreased.

Scattered bilateral pulmonary nodules measuring up to 5 mm in the
right lower lobe, unchanged.

## 2016-12-26 IMAGING — DX DG CHEST 2V
2 series · 2 of 2 positions shown · non-contrast
Comparison: PA and lateral chest x-ray January 26, 2015

CLINICAL DATA: Follow-up of known metastatic lung malignancy, no
current chest complaints

EXAM:
CHEST  2 VIEW

[chest pa]
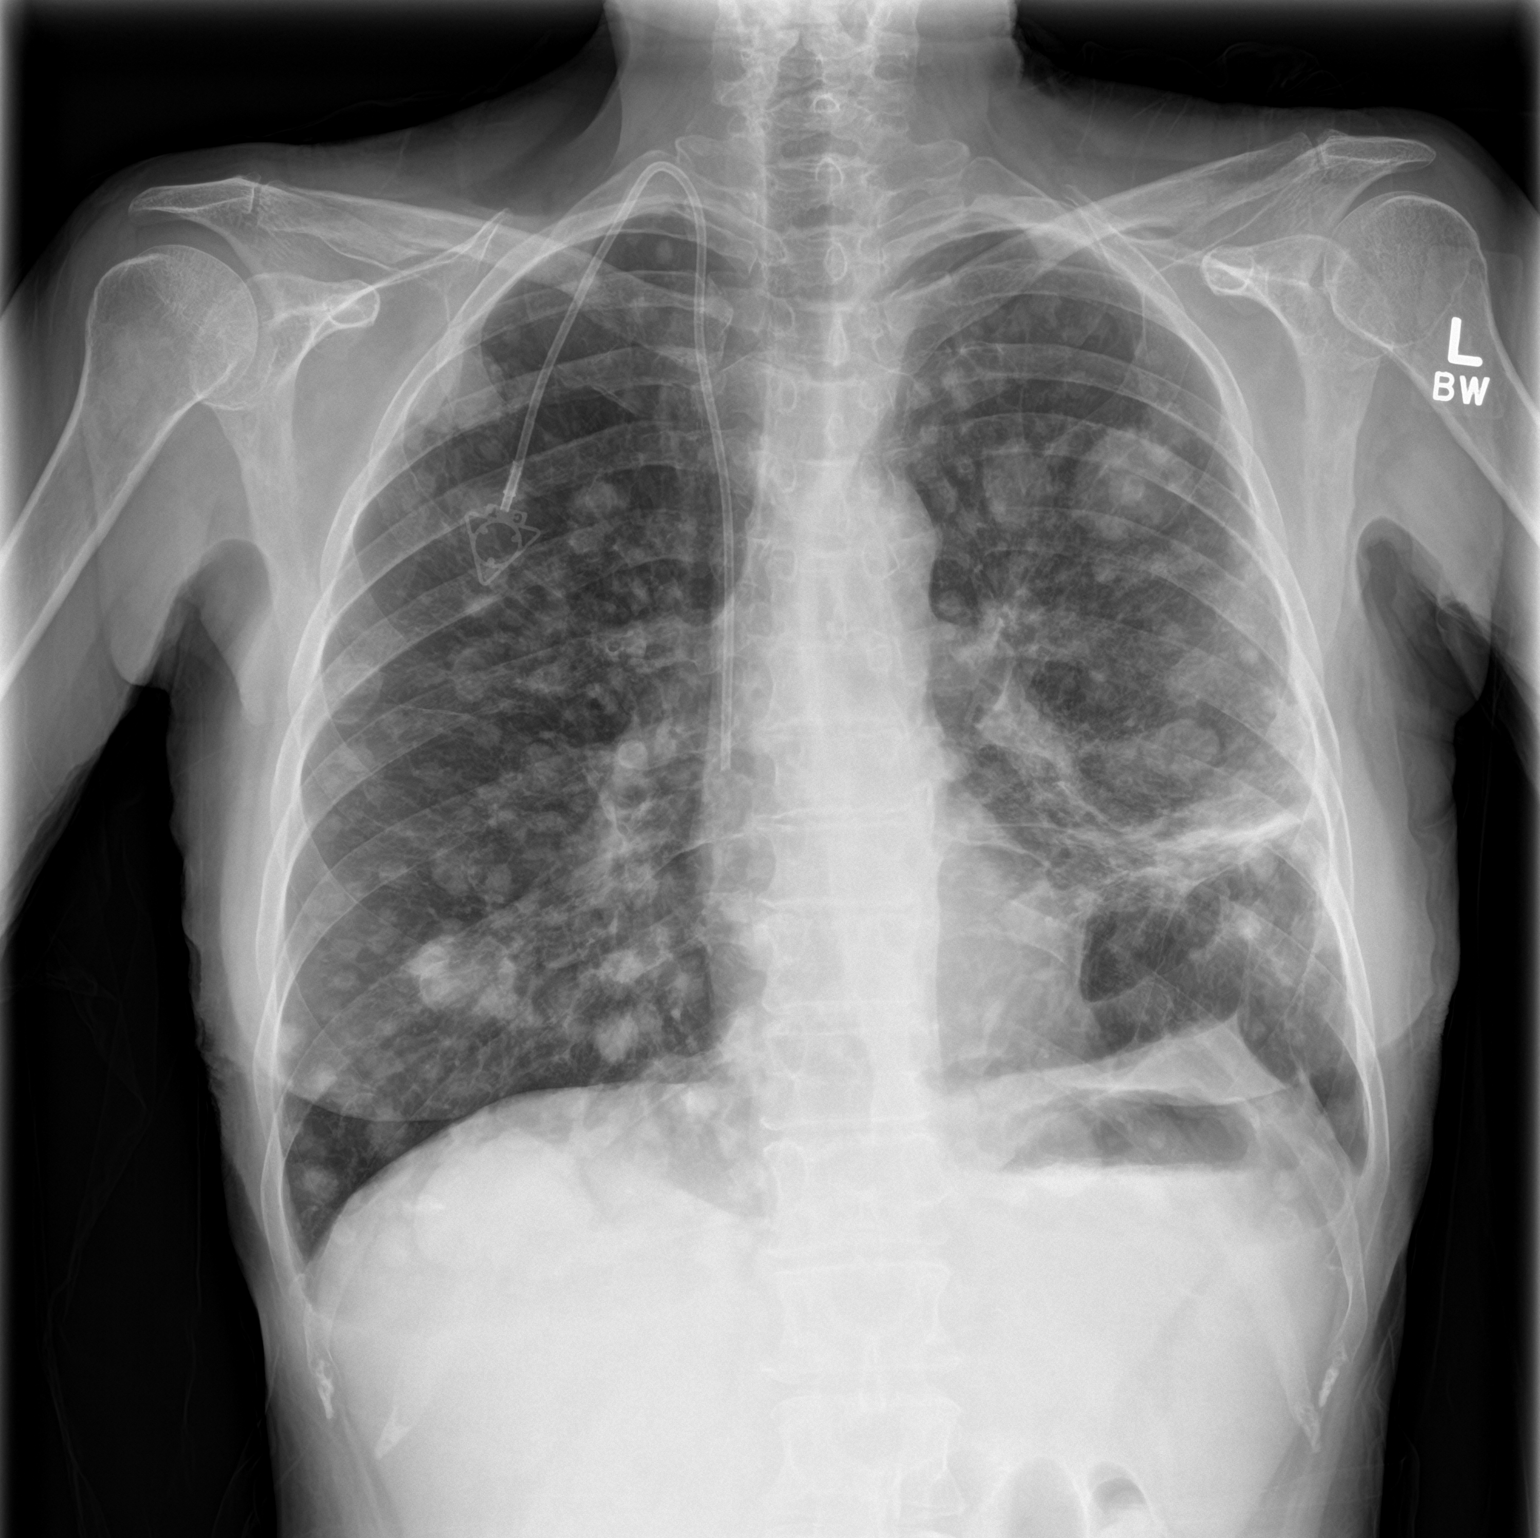

[chest lat]
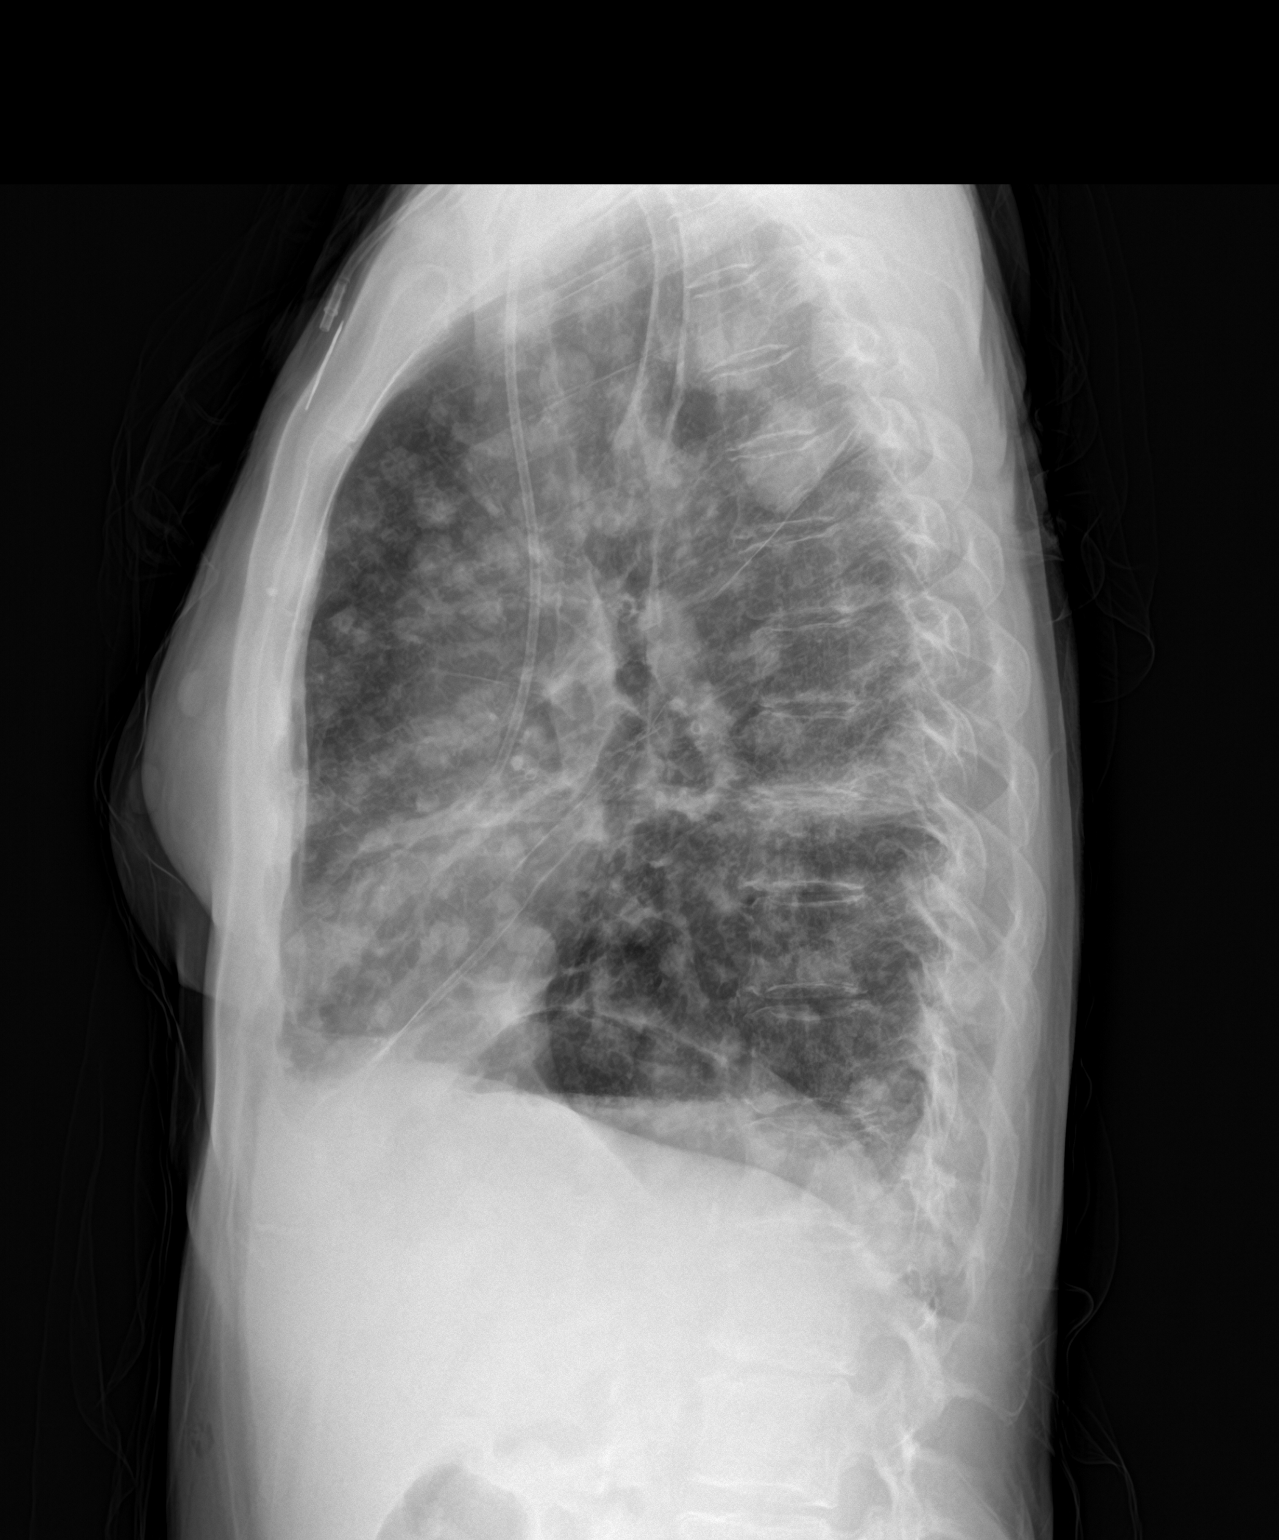

[2 of 2 positions shown; findings below may reference images not displayed]

FINDINGS: There has been interval increase in the size and number of the
multiple pulmonary parenchymal nodules bilaterally. The largest
nodule lies in the right lower lobe posteriorly and measures
approximately 4.7 cm transversely. Previously it had measured
approximately 4 cm transversely. There is stable tenting of the left
hemidiaphragm. There is no pleural effusion or pneumothorax. The
heart and pulmonary vascularity are normal. The power port appliance
tip projects over the midportion of the SVC. The bony thorax
exhibits no acute abnormality.
IMPRESSION: Marked interval progression of widespread pulmonary metastatic
disease.
